# Patient Record
Sex: Female | Born: 1950
Health system: Southern US, Community
[De-identification: ages and names within clinical notes are randomized; demographics above are authoritative.]

## PROBLEM LIST (undated history)

## (undated) DIAGNOSIS — I10 Essential (primary) hypertension: Secondary | ICD-10-CM

## (undated) DIAGNOSIS — K219 Gastro-esophageal reflux disease without esophagitis: Secondary | ICD-10-CM

## (undated) DIAGNOSIS — F419 Anxiety disorder, unspecified: Secondary | ICD-10-CM

## (undated) DIAGNOSIS — K529 Noninfective gastroenteritis and colitis, unspecified: Secondary | ICD-10-CM

## (undated) DIAGNOSIS — R011 Cardiac murmur, unspecified: Secondary | ICD-10-CM

## (undated) DIAGNOSIS — N950 Postmenopausal bleeding: Secondary | ICD-10-CM

## (undated) DIAGNOSIS — L03313 Cellulitis of chest wall: Secondary | ICD-10-CM

## (undated) DIAGNOSIS — Z8719 Personal history of other diseases of the digestive system: Secondary | ICD-10-CM

## (undated) DIAGNOSIS — E039 Hypothyroidism, unspecified: Secondary | ICD-10-CM

## (undated) DIAGNOSIS — Z872 Personal history of diseases of the skin and subcutaneous tissue: Secondary | ICD-10-CM

## (undated) DIAGNOSIS — Z8679 Personal history of other diseases of the circulatory system: Secondary | ICD-10-CM

## (undated) DIAGNOSIS — K859 Acute pancreatitis without necrosis or infection, unspecified: Secondary | ICD-10-CM

## (undated) DIAGNOSIS — Z9889 Other specified postprocedural states: Secondary | ICD-10-CM

## (undated) DIAGNOSIS — F329 Major depressive disorder, single episode, unspecified: Secondary | ICD-10-CM

## (undated) DIAGNOSIS — M7550 Bursitis of unspecified shoulder: Secondary | ICD-10-CM

## (undated) DIAGNOSIS — C50912 Malignant neoplasm of unspecified site of left female breast: Secondary | ICD-10-CM

## (undated) DIAGNOSIS — Z973 Presence of spectacles and contact lenses: Secondary | ICD-10-CM

## (undated) DIAGNOSIS — N189 Chronic kidney disease, unspecified: Secondary | ICD-10-CM

## (undated) DIAGNOSIS — R112 Nausea with vomiting, unspecified: Secondary | ICD-10-CM

## (undated) DIAGNOSIS — C50412 Malignant neoplasm of upper-outer quadrant of left female breast: Secondary | ICD-10-CM

## (undated) DIAGNOSIS — Z170 Estrogen receptor positive status [ER+]: Secondary | ICD-10-CM

## (undated) HISTORY — PX: EYE SURGERY: SHX253

## (undated) HISTORY — PX: CATARACT EXTRACTION: SUR2

## (undated) HISTORY — PX: DENTAL SURGERY: SHX609

## (undated) HISTORY — PX: DILATION AND CURETTAGE OF UTERUS: SHX78

## (undated) HISTORY — PX: BREAST BIOPSY: SHX20

## (undated) HISTORY — PX: CATARACT EXTRACTION W/ INTRAOCULAR LENS IMPLANT: SHX1309

## (undated) HISTORY — PX: LIVER SURGERY: SHX698

## (undated) HISTORY — DX: Gastro-esophageal reflux disease without esophagitis: K21.9

## (undated) HISTORY — DX: Chronic kidney disease, unspecified: N18.9

## (undated) HISTORY — DX: Anxiety disorder, unspecified: F41.9

## (undated) HISTORY — PX: CATARACT EXTRACTION W/ INTRAOCULAR LENS  IMPLANT, BILATERAL: SHX1307

---

## 1999-08-08 ENCOUNTER — Other Ambulatory Visit: Admission: RE | Admit: 1999-08-08 | Discharge: 1999-08-08 | Payer: Self-pay | Admitting: Gynecology

## 2000-08-15 ENCOUNTER — Other Ambulatory Visit: Admission: RE | Admit: 2000-08-15 | Discharge: 2000-08-15 | Payer: Self-pay | Admitting: Gynecology

## 2001-09-16 ENCOUNTER — Other Ambulatory Visit: Admission: RE | Admit: 2001-09-16 | Discharge: 2001-09-16 | Payer: Self-pay | Admitting: Gynecology

## 2002-09-21 ENCOUNTER — Other Ambulatory Visit: Admission: RE | Admit: 2002-09-21 | Discharge: 2002-09-21 | Payer: Self-pay | Admitting: Gynecology

## 2003-09-28 ENCOUNTER — Other Ambulatory Visit: Admission: RE | Admit: 2003-09-28 | Discharge: 2003-09-28 | Payer: Self-pay | Admitting: Obstetrics and Gynecology

## 2004-11-13 ENCOUNTER — Other Ambulatory Visit: Admission: RE | Admit: 2004-11-13 | Discharge: 2004-11-13 | Payer: Self-pay | Admitting: Gynecology

## 2005-06-08 ENCOUNTER — Encounter: Admission: RE | Admit: 2005-06-08 | Discharge: 2005-06-08 | Payer: Self-pay | Admitting: Internal Medicine

## 2008-07-13 ENCOUNTER — Other Ambulatory Visit: Admission: RE | Admit: 2008-07-13 | Discharge: 2008-07-13 | Payer: Self-pay | Admitting: Internal Medicine

## 2009-07-12 ENCOUNTER — Ambulatory Visit (HOSPITAL_COMMUNITY): Admission: RE | Admit: 2009-07-12 | Discharge: 2009-07-12 | Payer: Self-pay | Admitting: Internal Medicine

## 2009-09-01 ENCOUNTER — Ambulatory Visit (HOSPITAL_COMMUNITY): Admission: RE | Admit: 2009-09-01 | Discharge: 2009-09-01 | Payer: Self-pay | Admitting: Internal Medicine

## 2010-10-27 ENCOUNTER — Encounter (INDEPENDENT_AMBULATORY_CARE_PROVIDER_SITE_OTHER): Payer: Self-pay | Admitting: *Deleted

## 2010-10-31 NOTE — Letter (Signed)
Summary: Pre Visit Letter Revised  [facility]  [address] [place], [place] [address]   Phone: [phone]  Fax: [phone]        10/27/2010 MRN: [id number] [name] [address] [place]  [address]             Procedure Date:  12/31/2010 @ 9:00    Direct colon-Dr. Jarold Motto  Welcome to the Gastroenterology Division at [facility].    You are scheduled to see a nurse for your pre-procedure visit on 11/20/2010 at 8:30 on the 3rd floor at [facility], [address]  We ask that you try to arrive at our office 15 minutes prior to your appointment time to allow for check-in.  Please take a minute to review the attached form.  If you answer "Yes" to one or more of the questions on the first page, we ask that you call the person listed at your earliest opportunity.  If you answer "No" to all of the questions, please complete the rest of the form and bring it to your appointment.    Your nurse visit will consist of discussing your medical and surgical history, your immediate family medical history, and your medications.   If you are unable to list all of your medications on the form, please bring the medication bottles to your appointment and we will list them.  We will need to be aware of both prescribed and over the counter drugs.  We will need to know exact dosage information as well.    Please be prepared to read and sign documents such as consent forms, a financial agreement, and acknowledgement forms.  If necessary, and with your consent, a friend or relative is welcome to sit-in on the nurse visit with you.  Please bring your insurance card so that we may make a copy of it.  If your insurance requires a referral to see a specialist, please bring your referral form from your primary care physician.  No co-pay is required for this nurse visit.     If you cannot keep your appointment, please call [phone] to cancel or reschedule prior to  your appointment date.  This allows [place] the opportunity to schedule an appointment for another patient in need of care.    Thank you for choosing [facility] for your medical needs.  We appreciate the opportunity to care for you.  Please visit [place] at our website  to learn more about our practice.  Sincerely, The Gastroenterology Division

## 2010-11-17 ENCOUNTER — Encounter: Payer: Self-pay | Admitting: *Deleted

## 2010-11-20 ENCOUNTER — Encounter: Payer: Self-pay | Admitting: Gastroenterology

## 2010-11-30 NOTE — Miscellaneous (Signed)
Summary: [facility]  Clinical Lists Changes  Medications: Added new medication of MOVIPREP 100 GM  SOLR (PEG-KCL-NACL-NASULF-NA ASC-C) As per prep instructions. - Signed Rx of MOVIPREP 100 GM  SOLR (PEG-KCL-NACL-NASULF-NA ASC-C) As per prep instructions.;  #1 x 0;  Signed;  Entered by: Ezra Sites RN;  Authorized by: Mardella Layman MD [facility];  Method used: Electronically to [facility] [address], [place], [place], [place]  [address], Ph: [phone], Fax: [phone] Observations: Added new observation of NKA: T (11/20/2010 8:11)    Prescriptions: MOVIPREP 100 GM  SOLR (PEG-KCL-NACL-NASULF-NA ASC-C) As per prep instructions.  #1 x 0   Entered by:   Ezra Sites RN   Authorized by:   Mardella Layman MD [facility]   Signed by:   Ezra Sites RN on 11/20/2010   Method used:   Electronically to        [facility] (retail)       [address]       [place], [place]  [address]       Ph: [phone]       Fax: [phone]   RxID:   [id number]

## 2010-11-30 NOTE — Letter (Signed)
Summary: [name] Instructions  [name]  [address] [place], [place] [address]   Phone: [phone]  Fax: [phone]       [name]    Jul 12, 1951    MRN: [id number]        Procedure Day /Date:  Monday 12/04/2010     Arrival Time: 8:00 am      Procedure Time: 9:00 am     Location of Procedure:                    _x _  [facility] (4th Floor)                        PREPARATION FOR COLONOSCOPY WITH MOVIPREP   Starting 5 days prior to your procedure Wednesday 3/28 do not eat nuts, seeds, popcorn, corn, beans, peas,  salads, or any raw vegetables.  Do not take any fiber supplements (e.g. Metamucil, Citrucel, and Benefiber).  THE DAY BEFORE YOUR PROCEDURE         DATE: Sunday 4/1 1.  Drink clear liquids the entire day-NO SOLID FOOD  2.  Do not drink anything colored red or purple.  Avoid juices with pulp.  No orange juice.  3.  Drink at least 64 oz. (8 glasses) of fluid/clear liquids during the day to prevent dehydration and help the prep work efficiently.  CLEAR LIQUIDS INCLUDE: Water Jello Ice Popsicles Tea (sugar ok, no milk/cream) Powdered fruit flavored drinks Coffee (sugar ok, no milk/cream) Gatorade Juice: apple, white grape, white cranberry  Lemonade Clear bullion, consomm, broth Carbonated beverages (any kind) Strained chicken noodle soup Hard Candy                             4.  In the morning, mix first dose of MoviPrep solution:    Empty 1 Pouch A and 1 Pouch B into the disposable container    Add lukewarm drinking water to the top line of the container. Mix to dissolve    Refrigerate (mixed solution should be used within 24 hrs)  5.  Begin drinking the prep at 5:00 p.m. The MoviPrep container is divided by 4 marks.   Every 15 minutes drink the solution down to the next mark (approximately 8 oz) until the full liter is complete.   6.  Follow completed prep with 16 oz of clear liquid of your choice (Nothing red  or purple).  Continue to drink clear liquids until bedtime.  7.  Before going to bed, mix second dose of MoviPrep solution:    Empty 1 Pouch A and 1 Pouch B into the disposable container    Add lukewarm drinking water to the top line of the container. Mix to dissolve    Refrigerate  THE DAY OF YOUR PROCEDURE      DATE: Monday 4/2  Beginning at 4:00 a.m. (5 hours before procedure):         1. Every 15 minutes, drink the solution down to the next mark (approx 8 oz) until the full liter is complete.  2. Follow completed prep with 16 oz. of clear liquid of your choice.    3. You may drink clear liquids until 7:00 am (2 HOURS BEFORE PROCEDURE).   MEDICATION INSTRUCTIONS  Unless otherwise instructed, you should take regular prescription medications with a small sip of water   as early as possible the morning of your  procedure.          OTHER INSTRUCTIONS  You will need a responsible adult at least 61 years of age to accompany you and drive you home.   This person must remain in the waiting room during your procedure.  Wear loose fitting clothing that is easily removed.  Leave jewelry and other valuables at home.  However, you may wish to bring a book to read or  an iPod/MP3 player to listen to music as you wait for your procedure to start.  Remove all body piercing jewelry and leave at home.  Total time from sign-in until discharge is approximately 2-3 hours.  You should go home directly after your procedure and rest.  You can resume normal activities the  day after your procedure.  The day of your procedure you should not:   Drive   Make legal decisions   Operate machinery   Drink alcohol   Return to work  You will receive specific instructions about eating, activities and medications before you leave.    The above instructions have been reviewed and explained to me by   Ezra Sites RN  November 20, 2010 8:49 AM     I fully understand and can verbalize  these instructions _____________________________ Date _________

## 2010-12-04 ENCOUNTER — Ambulatory Visit: Payer: BC Managed Care – PPO | Admitting: Gastroenterology

## 2010-12-04 ENCOUNTER — Encounter: Payer: Self-pay | Admitting: Gastroenterology

## 2010-12-04 VITALS — BP 152/76 | HR 61 | Temp 97.6°F | Resp 18 | Ht 64.5 in | Wt 180.0 lb

## 2010-12-04 DIAGNOSIS — R1032 Left lower quadrant pain: Secondary | ICD-10-CM

## 2010-12-04 DIAGNOSIS — Z1211 Encounter for screening for malignant neoplasm of colon: Secondary | ICD-10-CM

## 2010-12-04 DIAGNOSIS — K5904 Chronic idiopathic constipation: Secondary | ICD-10-CM

## 2010-12-04 DIAGNOSIS — K5900 Constipation, unspecified: Secondary | ICD-10-CM

## 2010-12-04 DIAGNOSIS — K589 Irritable bowel syndrome without diarrhea: Secondary | ICD-10-CM

## 2010-12-04 DIAGNOSIS — R109 Unspecified abdominal pain: Secondary | ICD-10-CM

## 2010-12-04 NOTE — Patient Instructions (Signed)
Discharge instructions given with verbal understanding. Instructions for a high fiber diet.

## 2010-12-05 ENCOUNTER — Telehealth: Payer: Self-pay

## 2010-12-05 ENCOUNTER — Telehealth: Payer: Self-pay | Admitting: *Deleted

## 2010-12-05 NOTE — Telephone Encounter (Signed)

## 2010-12-05 NOTE — Telephone Encounter (Signed)
LMOM for pt to call back. Per her COLON on 12/04/10, Dr Jarold Motto would like to schedule a f/u in 1 month.

## 2010-12-06 LAB — COMPREHENSIVE METABOLIC PANEL
ALT: 16 U/L (ref 0–35)
AST: 15 U/L (ref 0–37)
Albumin: 3.7 g/dL (ref 3.5–5.2)
Alkaline Phosphatase: 72 U/L (ref 39–117)
BUN: 8 mg/dL (ref 6–23)
CO2: 25 mEq/L (ref 19–32)
Calcium: 8.9 mg/dL (ref 8.4–10.5)
Chloride: 108 mEq/L (ref 96–112)
Creatinine, Ser: 0.64 mg/dL (ref 0.4–1.2)
GFR calc Af Amer: >60 mL/min (ref >60)
GFR calc non Af Amer: >60 mL/min (ref >60)
Glucose, Bld: 92 mg/dL (ref 70–99)
Potassium: 3.5 mEq/L (ref 3.5–5.1)
Sodium: 140 mEq/L (ref 135–145)
Total Bilirubin: 0.5 mg/dL (ref 0.3–1.2)
Total Protein: 6.3 g/dL (ref 6.0–8.3)

## 2010-12-06 LAB — CBC
HCT: 40 % (ref 36.0–46.0)
Hemoglobin: 13.9 g/dL (ref 12.0–15.0)
MCHC: 34.7 g/dL (ref 30.0–36.0)
MCV: 97.6 fL (ref 78.0–100.0)
Platelets: 270 10*3/uL (ref 150–400)
RBC: 4.1 MIL/uL (ref 3.87–5.11)
RDW: 13.4 % (ref 11.5–15.5)
WBC: 12 10*3/uL — ABNORMAL HIGH (ref 4.0–10.5)

## 2010-12-06 LAB — LIPID PANEL
Cholesterol: 155 mg/dL (ref 0–200)
HDL: 60 mg/dL (ref >39)
LDL Cholesterol: 40 mg/dL (ref 0–99)
Total CHOL/HDL Ratio: 2.6 RATIO
Triglycerides: 275 mg/dL — ABNORMAL HIGH (ref <150)
VLDL: 55 mg/dL — ABNORMAL HIGH (ref 0–40)

## 2010-12-13 ENCOUNTER — Encounter: Payer: Self-pay | Admitting: *Deleted

## 2010-12-13 ENCOUNTER — Telehealth: Payer: Self-pay | Admitting: *Deleted

## 2010-12-13 NOTE — Telephone Encounter (Signed)
Lmom for pt to call back on home number. Other number listed went to a recording.

## 2010-12-13 NOTE — Telephone Encounter (Signed)
Mailed pt a letter

## 2010-12-13 NOTE — Telephone Encounter (Signed)
Pt called back and was given an appt. on 01/23/11 at 1:30pm.

## 2011-01-01 ENCOUNTER — Other Ambulatory Visit: Payer: Self-pay | Admitting: Internal Medicine

## 2011-01-08 ENCOUNTER — Ambulatory Visit
Admission: RE | Admit: 2011-01-08 | Discharge: 2011-01-08 | Disposition: A | Discharge status: Home / Self Care | Payer: BC Managed Care – PPO | Source: Ambulatory Visit | Attending: Internal Medicine | Admitting: Internal Medicine

## 2011-01-08 MED ORDER — IOHEXOL 300 MG/ML  SOLN
125.0000 mL | Freq: Once | INTRAMUSCULAR | Status: AC | PRN
  Administered 2011-01-08: 125 mL via INTRAVENOUS

## 2011-01-23 ENCOUNTER — Ambulatory Visit: Payer: BC Managed Care – PPO | Admitting: Gastroenterology

## 2011-07-30 ENCOUNTER — Encounter: Payer: Self-pay | Admitting: Gastroenterology

## 2011-08-14 ENCOUNTER — Ambulatory Visit (INDEPENDENT_AMBULATORY_CARE_PROVIDER_SITE_OTHER): Payer: BC Managed Care – PPO | Admitting: Gastroenterology

## 2011-08-14 ENCOUNTER — Other Ambulatory Visit (INDEPENDENT_AMBULATORY_CARE_PROVIDER_SITE_OTHER): Payer: BC Managed Care – PPO

## 2011-08-14 ENCOUNTER — Encounter: Payer: Self-pay | Admitting: Gastroenterology

## 2011-08-14 VITALS — BP 136/80 | HR 68 | Ht 65.0 in | Wt 170.6 lb

## 2011-08-14 DIAGNOSIS — K8020 Calculus of gallbladder without cholecystitis without obstruction: Secondary | ICD-10-CM

## 2011-08-14 DIAGNOSIS — F419 Anxiety disorder, unspecified: Secondary | ICD-10-CM | POA: Insufficient documentation

## 2011-08-14 DIAGNOSIS — R143 Flatulence: Secondary | ICD-10-CM

## 2011-08-14 DIAGNOSIS — K219 Gastro-esophageal reflux disease without esophagitis: Secondary | ICD-10-CM | POA: Insufficient documentation

## 2011-08-14 DIAGNOSIS — F411 Generalized anxiety disorder: Secondary | ICD-10-CM

## 2011-08-14 DIAGNOSIS — R141 Gas pain: Secondary | ICD-10-CM | POA: Insufficient documentation

## 2011-08-14 LAB — BASIC METABOLIC PANEL
BUN: 13 mg/dL (ref 6–23)
CO2: 29 mEq/L (ref 19–32)
Calcium: 9.3 mg/dL (ref 8.4–10.5)
Chloride: 111 mEq/L (ref 96–112)
Creatinine, Ser: 0.8 mg/dL (ref 0.4–1.2)
GFR: 78.49 mL/min (ref >60.00)
Glucose, Bld: 103 mg/dL — ABNORMAL HIGH (ref 70–99)
Potassium: 4.6 mEq/L (ref 3.5–5.1)
Sodium: 146 mEq/L — ABNORMAL HIGH (ref 135–145)

## 2011-08-14 LAB — CBC WITH DIFFERENTIAL/PLATELET
Basophils Absolute: 0.1 10*3/uL (ref 0.0–0.1)
Basophils Relative: 0.8 % (ref 0.0–3.0)
Eosinophils Absolute: 0.4 10*3/uL (ref 0.0–0.7)
Eosinophils Relative: 4.5 % (ref 0.0–5.0)
HCT: 40.9 % (ref 36.0–46.0)
Hemoglobin: 14 g/dL (ref 12.0–15.0)
Lymphocytes Relative: 30.6 % (ref 12.0–46.0)
Lymphs Abs: 2.7 10*3/uL (ref 0.7–4.0)
MCHC: 34.1 g/dL (ref 30.0–36.0)
MCV: 96 fl (ref 78.0–100.0)
Monocytes Absolute: 0.6 10*3/uL (ref 0.1–1.0)
Monocytes Relative: 7 % (ref 3.0–12.0)
Neutro Abs: 5.1 10*3/uL (ref 1.4–7.7)
Neutrophils Relative %: 57.1 % (ref 43.0–77.0)
Platelets: 278 10*3/uL (ref 150.0–400.0)
RBC: 4.26 Mil/uL (ref 3.87–5.11)
RDW: 13.4 % (ref 11.5–14.6)
WBC: 8.9 10*3/uL (ref 4.5–10.5)

## 2011-08-14 LAB — HEPATIC FUNCTION PANEL
ALT: 18 U/L (ref 0–35)
AST: 22 U/L (ref 0–37)
Albumin: 3.9 g/dL (ref 3.5–5.2)
Alkaline Phosphatase: 92 U/L (ref 39–117)
Bilirubin, Direct: 0 mg/dL (ref 0.0–0.3)
Total Bilirubin: 0.4 mg/dL (ref 0.3–1.2)
Total Protein: 6.9 g/dL (ref 6.0–8.3)

## 2011-08-14 LAB — FERRITIN: Ferritin: 114.5 ng/mL (ref 10.0–291.0)

## 2011-08-14 LAB — IBC PANEL
Iron: 64 ug/dL (ref 42–145)
Saturation Ratios: 15.2 % — ABNORMAL LOW (ref 20.0–50.0)
Transferrin: 301.5 mg/dL (ref 212.0–360.0)

## 2011-08-14 LAB — VITAMIN B12: Vitamin B-12: 441 pg/mL (ref 211–911)

## 2011-08-14 LAB — TSH: TSH: 3.1 u[IU]/mL (ref 0.35–5.50)

## 2011-08-14 LAB — FOLATE: Folate: >24.8 ng/mL (ref >5.9)

## 2011-08-14 MED ORDER — ESOMEPRAZOLE MAGNESIUM 40 MG PO CPDR: 40.0000 mg | DELAYED_RELEASE_CAPSULE | Freq: Every day | ORAL | Status: DC

## 2011-08-14 NOTE — Progress Notes (Addendum)
History of Present Illness:  This is a very pleasant 60 year old Caucasian female who had screening colonoscopy in April of this year which was unremarkable. She now presents with chronic right upper quadrant pain radiating into the tip of her right shoulder blade felt nausea and vomiting. She does have her a severe abdominal gas and bloating, belching, burping, and passage of excessive flatus. She has rather typical reflux symptoms but has not been on therapy. No history of dysphagia, icterus, melena, hematochezia or bowel irregularity. The patient is a smoker but is only smoking cessation program, and denies use of alcohol or NSAIDs. Over the last several years she has gained 30 pounds in weight. In the history otherwise is noncontributory. CT scan of the abdomen this summer which was performed because of hematuria showed calcified gallstones with a small contracted gallbladder. There is no evidence of biliary ductal dilation. There is no history of previous hepatitis or pancreatitis or other chronic medical problems except for a chronic anxiety disorder.  I have reviewed this patient's present history, medical and surgical past history, allergies and medications.     ROS: The remainder of the 10 point ROS is negative     Physical Exam: General well developed well nourished patient in no acute distress, appearing their stated age. No stigmata of chronic liver disease noted Eyes PERRLA, no icterus, fundoscopic exam per opthamologist Skin no lesions noted Neck supple, no adenopathy, no thyroid enlargement, no tenderness Chest clear to percussion and auscultation Heart no significant murmurs, gallops or rubs noted Abdomen no hepatosplenomegaly masses or tenderness, BS normal. . Extremities no acute joint lesions, edema, phlebitis or evidence of cellulitis. Neurologic patient oriented x 3, cranial nerves intact, no focal neurologic deficits noted. Psychological mental status normal and normal  affect.  Assessment and plan: Chronic cholecystitis with calcified gallstones. She also has acid reflux, but many of her symptoms are probably related to her chronic gallbladder disease, ulcer her use of nonabsorbable carbohydrates with diet gum use,. I have placed her on a reflux regime with Dexilant 60 mg every morning. She is to avoid sorbitol and fructose in her diet. We have scheduled her for surgical consultation. He did see her patient education video on gallbladder management. Otherwise she is to continue her medical management as per primary care. I have suggested that she use Nicorette chewing gum for smoking cessation and avoidance of by mouth nonabsorbable carbohydrates. Encounter Diagnosis  Name Primary?  Merri Ray stones Yes

## 2011-08-14 NOTE — Patient Instructions (Signed)
Please go to the basement today for your labs.  Today you watched the movie on gall bladder surgery. Your prescription(s) have been sent to you pharmacy.  You were given a handout on artificial sweeteners to avoid.  Follow the [employer] below. We have referred you to [facility] Dr Derrell Lolling, the appt is on 09/06/2011 arrive at 8:15am to fill out paper work, Make sure you take your insurance card, copay and list of your medication. If you want to reschedule or cancel this appt call [phone], you can also call and check on a sooner appt if your interested. Their address is [address] [place] [address].  Diet for GERD or PUD Nutrition therapy can help ease the discomfort of gastroesophageal reflux disease (GERD) and peptic ulcer disease (PUD).  HOME CARE INSTRUCTIONS   Eat your meals slowly, in a relaxed setting.   Eat 5 to 6 small meals per day.   If a food causes distress, stop eating it for a period of time.  FOODS TO AVOID  Coffee, regular or decaffeinated.   Cola beverages, regular or low calorie.   Tea, regular or decaffeinated.   Pepper.   Cocoa.   High fat foods, including meats.   Butter, margarine, hydrogenated oil (trans fats).   Peppermint or spearmint (if you have GERD).   Fruits and vegetables if not tolerated.   Alcohol.   Nicotine (smoking or chewing). This is one of the most potent stimulants to acid production in the gastrointestinal tract.   Any food that seems to aggravate your condition.  If you have questions regarding your diet, ask your caregiver or a registered dietitian. TIPS  Lying flat may make symptoms worse. Keep the head of your bed raised 6 to 9 inches (15 to 23 cm) by using a foam wedge or blocks under the legs of the bed.   Do not lay down until 3 hours after eating a meal.   Daily physical activity may help reduce symptoms.  MAKE SURE YOU:   Understand these instructions.   Will watch your  condition.   Will get help right away if you are not doing well or get worse.  Document Released: 08/20/2005 Document Revised: 05/02/2011 Document Reviewed: 01/03/2009 [facility] Patient Information 2012 [place], [place].

## 2011-09-06 ENCOUNTER — Encounter (INDEPENDENT_AMBULATORY_CARE_PROVIDER_SITE_OTHER): Payer: Self-pay | Admitting: General Surgery

## 2011-09-06 ENCOUNTER — Telehealth (INDEPENDENT_AMBULATORY_CARE_PROVIDER_SITE_OTHER): Payer: Self-pay

## 2011-09-06 ENCOUNTER — Ambulatory Visit (INDEPENDENT_AMBULATORY_CARE_PROVIDER_SITE_OTHER): Payer: BC Managed Care – PPO | Admitting: General Surgery

## 2011-09-06 ENCOUNTER — Ambulatory Visit
Admission: RE | Admit: 2011-09-06 | Discharge: 2011-09-06 | Disposition: A | Discharge status: Home / Self Care | Payer: BC Managed Care – PPO | Source: Ambulatory Visit | Attending: General Surgery | Admitting: General Surgery

## 2011-09-06 VITALS — BP 150/84 | HR 80 | Temp 98.0°F | Resp 18 | Ht 65.0 in | Wt 169.8 lb

## 2011-09-06 DIAGNOSIS — R1011 Right upper quadrant pain: Secondary | ICD-10-CM

## 2011-09-06 NOTE — Telephone Encounter (Signed)
Pt notified of ultrasound result per Dr Jacinto Halim request. Pt to proceed with surgery as planned.

## 2011-09-06 NOTE — Progress Notes (Signed)
Patient ID: [name], female   DOB: June 21, 1951, 61 y.o.   MRN: [id number]  Chief Complaint  Patient presents with  . Cholelithiasis    eval gb    HPI [name] is a 61 y.o. female.  She is referred by Dr. Sheryn Sanchez and Dr. Nila Sanchez for consideration of cholecystectomy.  The patient had screening colonoscopy in April which was unremarkable. She had a CT scan in April of 2012 for hematuria workup. There was no abnormality of the urologic tract, but  the gallbladder appeared to be slightly thickwalled and slightly contracted and there was a question of filling defects. I reviewed this and it is not diagnostic of stones or calcium deposits. She was asymptomatic in terms of her gallbladder at that time.  The past 2 months she's been having recurrent episodes of right upper quadrant pain, epigastric pain radiating to her right scapular area. This occurs at night and is associated with nausea and vomiting. Her symptoms are reduced since she is on Nexium. She's being careful with what she eats and that has helped. Bowel movements are normal. She has never had any liver problems.  She does have some reflux symptoms. She is a smoker. She has gained 30 pounds over the past few years. No history of pancreatitis or hepatitis or medical problems other than chronic anxiety disorder.  She is referred because of persistent symptoms ultimately to her gallbladder. HPI  Past Medical History  Diagnosis Date  . Chronic kidney disease   . Anxiety   . GERD (gastroesophageal reflux disease)     Past Surgical History  Procedure Date  . Cataracts l eye   . Cataract extraction     Family History  Problem Relation Age of Onset  . Heart disease Mother   . Hypertension Mother   . Leukemia Father   . Heart disease Father   . Breast cancer Maternal Aunt   . Colon cancer      Father's aunt  . Cancer Brother     Social History History  Substance Use Topics  . Smoking status: Current  Some Day Smoker  . Smokeless tobacco: Never Used  . Alcohol Use: 0.6 oz/week    1 Glasses of wine per week    No Known Allergies  Current Outpatient Prescriptions  Medication Sig Dispense Refill  . Chromium Picolinate 1000 MCG TABS Take by mouth 1 day or 1 dose.        Marland Kitchen glucosamine-chondroitin 500-400 MG tablet Take 1 tablet by mouth 3 (three) times daily.        . Ascorbic Acid (VITAMIN C) 500 MG tablet Take 500 mg by mouth daily.        Marland Kitchen aspirin 81 MG EC tablet Take 81 mg by mouth daily.        . B Complex-Folic Acid (SUPER B COMPLEX MAXI PO) Take by mouth 1 dose over 46 hours.        . calcium carbonate (OS-CAL) 600 MG TABS Take 600 mg by mouth 2 (two) times daily.        . Calcium Carbonate-Vitamin D (CALCIUM-VITAMIN D) 500-200 MG-UNIT per tablet Take 1 tablet by mouth 2 (two) times daily with a meal.        . clonazePAM (KLONOPIN) 0.5 MG tablet Take 0.5 mg by mouth 2 (two) times daily as needed.        Marland Kitchen esomeprazole (NEXIUM) 40 MG capsule Take 1 capsule (40 mg total) by mouth daily.  30 capsule  1  . fish oil-omega-3 fatty acids 1000 MG capsule Take 2 g by mouth daily.        . Multiple Vitamin (MULTI-VITAMIN) tablet Take 1 tablet by mouth daily.        . NON FORMULARY Blueberry detox 8 oz daily         Review of Systems Review of Systems  Constitutional: Negative for fever, chills and unexpected weight change.  HENT: Negative for hearing loss, congestion, sore throat, trouble swallowing and voice change.   Eyes: Negative for visual disturbance.  Respiratory: Negative for cough and wheezing.   Cardiovascular: Negative for chest pain, palpitations and leg swelling.  Gastrointestinal: Positive for nausea, vomiting and abdominal pain. Negative for diarrhea, constipation, blood in stool, abdominal distention and anal bleeding.  Genitourinary: Negative for hematuria, vaginal bleeding and difficulty urinating.  Musculoskeletal: Positive for back pain. Negative for arthralgias.    Skin: Negative for rash and wound.  Neurological: Negative for seizures, syncope and headaches.  Hematological: Negative for adenopathy. Does not bruise/bleed easily.  Psychiatric/Behavioral: Negative for behavioral problems and confusion. The patient is nervous/anxious.     Blood pressure 150/84, pulse 80, temperature 98 F (36.7 C), resp. rate 18, height 5\' 5"  (1.651 m), weight 169 lb 12.8 oz (77.021 kg).  Physical Exam Physical Exam  Constitutional: She is oriented to person, place, and time. She appears well-developed and well-nourished. No distress.  HENT:  Head: Normocephalic and atraumatic.  Nose: Nose normal.  Mouth/Throat: No oropharyngeal exudate.  Eyes: Conjunctivae and EOM are normal. Pupils are equal, round, and reactive to light. Left eye exhibits no discharge. No scleral icterus.  Neck: Neck supple. No JVD present. No tracheal deviation present. No thyromegaly present.  Cardiovascular: Normal rate, regular rhythm, normal heart sounds and intact distal pulses.   No murmur heard. Pulmonary/Chest: Effort normal and breath sounds normal. No respiratory distress. She has no wheezes. She has no rales. She exhibits no tenderness.  Abdominal: Soft. Bowel sounds are normal. She exhibits no distension and no mass. There is no tenderness. There is no rebound and no guarding.       Subjectively tender in RUQ. No objective findings.  Musculoskeletal: She exhibits no edema and no tenderness.  Lymphadenopathy:    She has no cervical adenopathy.  Neurological: She is alert and oriented to person, place, and time. She exhibits normal muscle tone. Coordination normal.  Skin: Skin is warm. No rash noted. She is not diaphoretic. No erythema. No pallor.  Psychiatric: She has a normal mood and affect. Her behavior is normal. Judgment and thought content normal.    Data Reviewed  I have reviewed her CT scan from April, and I have reviewed her colonoscopy report, and I reviewed Dr. Maryfrances Sanchez office notes. Assessment    Right upper quadrant pain, nausea and vomiting, postprandial, food intolerances. High probability that this is due to biliary tract disease.  CT scan suggestive,  but not diagnostic of gallbladder disease.  Chronic anxiety disorder  Ongoing tobacco use  Past esophageal reflux disease`    Plan    Proceed with scheduling of laparoscopic cholecystectomy with cholangiogram.  I will take  the precaution of getting a preop ultrasound to be sure of the diagnosis.  I have discussed the indications and details of surgery with her. Risks and complications have been outlined, including but not limited to bleeding, infection, conversion to open laparotomy, bile leak, injury to adjacent organs such as the intestine or bile duct  with reconstructive surgery, wound healing problems, cardiac pulmonary and thromboembolic problems. She understands all these issues. All questions are answered. She agrees with the plan. She requested this be done at [facility] M 09/06/2011, 9:15 AM

## 2011-09-06 NOTE — Patient Instructions (Signed)
Your symptoms and CT scan strongly suggests that you're having recurrent gallbladder attacks. The CT scan is not completely diagnostic  of gallstones, and so we're going to take the precaution of getting an ultrasound this afternoon to be sure. In the meantime you will be scheduled for a laparoscopic cholecystectomy with cholangiogram at [facility]. I would advise a very low-fat diet until we get this done. I also advise to continue to take your Nexium.  Laparoscopic Cholecystectomy Laparoscopic cholecystectomy is surgery to remove the gallbladder. The gallbladder is located slightly to the right of center in the abdomen, behind the liver. It is a concentrating and storage sac for the bile produced in the liver. Bile aids in the digestion and absorption of fats. Gallbladder disease (cholecystitis) is an inflammation of your gallbladder. This condition is usually caused by a buildup of gallstones (cholelithiasis) in your gallbladder. Gallstones can block the flow of bile, resulting in inflammation and pain. In severe cases, emergency surgery may be required. When emergency surgery is not required, you will have time to prepare for the procedure. Laparoscopic surgery is an alternative to open surgery. Laparoscopic surgery usually has a shorter recovery time. Your common bile duct may also need to be examined and explored. Your caregiver will discuss this with you if he or she feels this should be done. If stones are found in the common bile duct, they may be removed. LET YOUR CAREGIVER KNOW ABOUT:  Allergies to food or medicine.   Medicines taken, including vitamins, herbs, eyedrops, over-the-counter medicines, and creams.   Use of steroids (by mouth or creams).   Previous problems with anesthetics or numbing medicines.   History of bleeding problems or blood clots.   Previous surgery.   Other health problems, including diabetes and kidney problems.   Possibility of pregnancy, if this applies.    RISKS AND COMPLICATIONS All surgery is associated with risks. Some problems that may occur following this procedure include:  Infection.   Damage to the common bile duct, nerves, arteries, veins, or other internal organs such as the stomach or intestines.   Bleeding.   A stone may remain in the common bile duct.  BEFORE THE PROCEDURE  Do not take aspirin for 3 days prior to surgery or blood thinners for 1 week prior to surgery.   Do not eat or drink anything after midnight the night before surgery.   Let your caregiver know if you develop a cold or other infectious problem prior to surgery.   You should be present 60 minutes before the procedure or as directed.  PROCEDURE  You will be given medicine that makes you sleep (general anesthetic). When you are asleep, your surgeon will make several small cuts (incisions) in your abdomen. One of these incisions is used to insert a small, lighted scope (laparoscope) into the abdomen. The laparoscope helps the surgeon see into your abdomen. Carbon dioxide gas will be pumped into your abdomen. The gas allows more room for the surgeon to perform your surgery. Other operating instruments are inserted through the other incisions. Laparoscopic procedures may not be appropriate when:  There is major scarring from previous surgery.   The gallbladder is extremely inflamed.   There are bleeding disorders or unexpected cirrhosis of the liver.   A pregnancy is near term.   Other conditions make the laparoscopic procedure impossible.  If your surgeon feels it is not safe to continue with a laparoscopic procedure, he or she will perform an open abdominal procedure.  In this case, the surgeon will make an incision to open the abdomen. This gives the surgeon a larger view and field to work within. This may allow the surgeon to perform procedures that sometimes cannot be performed with a laparoscope alone. Open surgery has a longer recovery time. AFTER THE  PROCEDURE  You will be taken to the recovery area where a nurse will watch and check your progress.   You may be allowed to go home the same day.   Do not resume physical activities until directed by your caregiver.   You may resume a normal diet and activities as directed.  Document Released: 08/20/2005 Document Revised: 05/02/2011 Document Reviewed: 02/02/2011 [facility] Patient Information 2012 [place], [place].

## 2011-09-07 ENCOUNTER — Encounter (HOSPITAL_COMMUNITY): Payer: Self-pay

## 2011-09-14 ENCOUNTER — Encounter (HOSPITAL_COMMUNITY): Payer: Self-pay | Admitting: *Deleted

## 2011-09-14 ENCOUNTER — Encounter (HOSPITAL_COMMUNITY): Payer: Self-pay

## 2011-09-14 ENCOUNTER — Encounter (HOSPITAL_COMMUNITY)
Admission: RE | Admit: 2011-09-14 | Discharge: 2011-09-14 | Disposition: A | Discharge status: Home / Self Care | Payer: BC Managed Care – PPO | Source: Ambulatory Visit | Attending: General Surgery | Admitting: General Surgery

## 2011-09-14 HISTORY — DX: Other specified postprocedural states: Z98.890

## 2011-09-14 HISTORY — DX: Nausea with vomiting, unspecified: R11.2

## 2011-09-14 LAB — CBC
Hemoglobin: 14.7 g/dL (ref 12.0–15.0)
MCV: 93.9 fL (ref 78.0–100.0)
Platelets: 300 10*3/uL (ref 150–400)
RBC: 4.59 MIL/uL (ref 3.87–5.11)
WBC: 9.9 10*3/uL (ref 4.0–10.5)

## 2011-09-14 LAB — URINALYSIS, ROUTINE W REFLEX MICROSCOPIC
Bilirubin Urine: NEGATIVE
Ketones, ur: NEGATIVE mg/dL
Nitrite: NEGATIVE
Protein, ur: NEGATIVE mg/dL
Urobilinogen, UA: 0.2 mg/dL (ref 0.0–1.0)

## 2011-09-14 LAB — COMPREHENSIVE METABOLIC PANEL
ALT: 15 U/L (ref 0–35)
AST: 17 U/L (ref 0–37)
CO2: 29 mEq/L (ref 19–32)
Chloride: 104 mEq/L (ref 96–112)
GFR calc non Af Amer: 77 mL/min — ABNORMAL LOW (ref >90)
Glucose, Bld: 103 mg/dL — ABNORMAL HIGH (ref 70–99)
Sodium: 143 mEq/L (ref 135–145)
Total Bilirubin: 0.7 mg/dL (ref 0.3–1.2)

## 2011-09-14 LAB — DIFFERENTIAL
Basophils Absolute: 0 10*3/uL (ref 0.0–0.1)
Basophils Relative: 0 % (ref 0–1)
Eosinophils Absolute: 0.5 10*3/uL (ref 0.0–0.7)
Eosinophils Relative: 5 % (ref 0–5)
Monocytes Absolute: 0.7 10*3/uL (ref 0.1–1.0)

## 2011-09-14 LAB — URINE MICROSCOPIC-ADD ON

## 2011-09-14 NOTE — Pre-Procedure Instructions (Signed)
20 [name]  09/14/2011   Your procedure is scheduled on: 07/17/12  Report to [facility] at 11:30 AM.  Call this number if you have problems the morning of surgery: [phone]   Remember: Discontinue Aspirin, Coumadin, Plavix, Effient and herbal medications.   Do not eat food:After Midnight.  May have clear liquids: up to 4 Hours before arrival.  Clear liquids include soda, tea, black coffee, apple or grape juice, broth.  Take these medicines the morning of surgery with A SIP OF WATER: klonopin, Nexium   Do not wear jewelry, make-up or nail polish.  Do not wear lotions, powders, or perfumes. You may wear deodorant.  Do not shave 48 hours prior to surgery.  Do not bring valuables to the hospital.  Contacts, dentures or bridgework may not be worn into surgery.  Leave suitcase in the car. After surgery it may be brought to your room.  For patients admitted to the hospital, checkout time is 11:00 AM the day of discharge.   Patients discharged the day of surgery will not be allowed to drive home.  Name and phone number of your driver: Marcy Panning (uncle) home ([phone]  Special Instructions: CHG Shower Use Special Wash: 1/2 bottle night before surgery and 1/2 bottle morning of surgery.   Please read over the following fact sheets that you were given: Pain Booklet, Coughing and Deep Breathing, MRSA Information and Surgical Site Infection Prevention

## 2011-09-16 MED ORDER — CEFAZOLIN SODIUM 1-5 GM-% IV SOLN
1.0000 g | INTRAVENOUS | Status: AC
  Administered 2011-09-17: 1 g via INTRAVENOUS
  Filled 2011-09-16: qty 50

## 2011-09-17 ENCOUNTER — Encounter (HOSPITAL_COMMUNITY): Payer: Self-pay | Admitting: *Deleted

## 2011-09-17 ENCOUNTER — Encounter (HOSPITAL_COMMUNITY): Payer: Self-pay | Admitting: Certified Registered"

## 2011-09-17 ENCOUNTER — Encounter (HOSPITAL_COMMUNITY)
Admission: RE | Disposition: A | Discharge status: Home / Self Care | Payer: Self-pay | Source: Ambulatory Visit | Attending: General Surgery

## 2011-09-17 ENCOUNTER — Ambulatory Visit (HOSPITAL_COMMUNITY)
Admission: RE | Admit: 2011-09-17 | Discharge: 2011-09-17 | Disposition: A | Discharge status: Home / Self Care | Payer: BC Managed Care – PPO | Source: Ambulatory Visit | Attending: General Surgery | Admitting: General Surgery

## 2011-09-17 ENCOUNTER — Ambulatory Visit (HOSPITAL_COMMUNITY): Payer: BC Managed Care – PPO | Admitting: Certified Registered"

## 2011-09-17 ENCOUNTER — Ambulatory Visit (HOSPITAL_COMMUNITY): Payer: BC Managed Care – PPO

## 2011-09-17 ENCOUNTER — Other Ambulatory Visit (INDEPENDENT_AMBULATORY_CARE_PROVIDER_SITE_OTHER): Payer: Self-pay | Admitting: General Surgery

## 2011-09-17 DIAGNOSIS — K8010 Calculus of gallbladder with chronic cholecystitis without obstruction: Secondary | ICD-10-CM | POA: Insufficient documentation

## 2011-09-17 DIAGNOSIS — Z01812 Encounter for preprocedural laboratory examination: Secondary | ICD-10-CM | POA: Insufficient documentation

## 2011-09-17 DIAGNOSIS — F411 Generalized anxiety disorder: Secondary | ICD-10-CM | POA: Insufficient documentation

## 2011-09-17 DIAGNOSIS — K219 Gastro-esophageal reflux disease without esophagitis: Secondary | ICD-10-CM | POA: Insufficient documentation

## 2011-09-17 HISTORY — PX: CHOLECYSTECTOMY: SHX55

## 2011-09-17 SURGERY — LAPAROSCOPIC CHOLECYSTECTOMY WITH INTRAOPERATIVE CHOLANGIOGRAM
Anesthesia: General | Wound class: Clean Contaminated

## 2011-09-17 MED ORDER — HYDROCODONE-ACETAMINOPHEN 5-325 MG PO TABS: 1.0000 | ORAL_TABLET | ORAL | Status: AC | PRN

## 2011-09-17 MED ORDER — MORPHINE SULFATE 2 MG/ML IJ SOLN: 2.0000 mg | INTRAMUSCULAR | Status: DC | PRN

## 2011-09-17 MED ORDER — ROCURONIUM BROMIDE 100 MG/10ML IV SOLN
INTRAVENOUS | Status: DC | PRN
  Administered 2011-09-17: 50 mg via INTRAVENOUS

## 2011-09-17 MED ORDER — SODIUM CHLORIDE 0.9 % IR SOLN
Status: DC | PRN
  Administered 2011-09-17: 1000 mL

## 2011-09-17 MED ORDER — BUPIVACAINE-EPINEPHRINE 0.25% -1:200000 IJ SOLN
INTRAMUSCULAR | Status: DC | PRN
  Administered 2011-09-17: 10 mL

## 2011-09-17 MED ORDER — OXYCODONE HCL 5 MG PO TABS: 5.0000 mg | ORAL_TABLET | ORAL | Status: DC | PRN

## 2011-09-17 MED ORDER — DROPERIDOL 2.5 MG/ML IJ SOLN
INTRAMUSCULAR | Status: DC | PRN
  Administered 2011-09-17: 0.625 mg via INTRAVENOUS

## 2011-09-17 MED ORDER — PROMETHAZINE HCL 25 MG/ML IJ SOLN: 12.5000 mg | Freq: Four times a day (QID) | INTRAMUSCULAR | Status: DC | PRN

## 2011-09-17 MED ORDER — LACTATED RINGERS IV SOLN
INTRAVENOUS | Status: DC
  Administered 2011-09-17: 12:00:00 via INTRAVENOUS

## 2011-09-17 MED ORDER — MEPERIDINE HCL 25 MG/ML IJ SOLN: 6.2500 mg | INTRAMUSCULAR | Status: DC | PRN

## 2011-09-17 MED ORDER — MIDAZOLAM HCL 5 MG/5ML IJ SOLN
INTRAMUSCULAR | Status: DC | PRN
  Administered 2011-09-17: 2 mg via INTRAVENOUS

## 2011-09-17 MED ORDER — ONDANSETRON HCL 4 MG/2ML IJ SOLN: 4.0000 mg | Freq: Four times a day (QID) | INTRAMUSCULAR | Status: DC | PRN

## 2011-09-17 MED ORDER — HYDROMORPHONE HCL PF 1 MG/ML IJ SOLN: 0.2500 mg | INTRAMUSCULAR | Status: DC | PRN

## 2011-09-17 MED ORDER — ONDANSETRON HCL 4 MG/2ML IJ SOLN: 4.0000 mg | Freq: Once | INTRAMUSCULAR | Status: DC | PRN

## 2011-09-17 MED ORDER — IOHEXOL 300 MG/ML  SOLN
INTRAMUSCULAR | Status: DC | PRN
  Administered 2011-09-17: 15 mL via INTRAVENOUS

## 2011-09-17 MED ORDER — GLYCOPYRROLATE 0.2 MG/ML IJ SOLN
INTRAMUSCULAR | Status: DC | PRN
  Administered 2011-09-17: .4 mg via INTRAVENOUS

## 2011-09-17 MED ORDER — ONDANSETRON HCL 4 MG/2ML IJ SOLN
INTRAMUSCULAR | Status: DC | PRN
  Administered 2011-09-17: 4 mg via INTRAVENOUS

## 2011-09-17 MED ORDER — ACETAMINOPHEN 325 MG PO TABS: 650.0000 mg | ORAL_TABLET | ORAL | Status: DC | PRN

## 2011-09-17 MED ORDER — ACETAMINOPHEN 650 MG RE SUPP: 650.0000 mg | RECTAL | Status: DC | PRN

## 2011-09-17 MED ORDER — SODIUM CHLORIDE 0.9 % IJ SOLN: 3.0000 mL | Freq: Two times a day (BID) | INTRAMUSCULAR | Status: DC

## 2011-09-17 MED ORDER — FENTANYL CITRATE 0.05 MG/ML IJ SOLN
INTRAMUSCULAR | Status: DC | PRN
  Administered 2011-09-17 (×2): 50 ug via INTRAVENOUS
  Administered 2011-09-17: 150 ug via INTRAVENOUS
  Administered 2011-09-17: 100 ug via INTRAVENOUS

## 2011-09-17 MED ORDER — SODIUM CHLORIDE 0.9 % IV SOLN: 250.0000 mL | INTRAVENOUS | Status: DC | PRN

## 2011-09-17 MED ORDER — LACTATED RINGERS IV SOLN
INTRAVENOUS | Status: DC | PRN
  Administered 2011-09-17 (×2): via INTRAVENOUS

## 2011-09-17 MED ORDER — DEXAMETHASONE SODIUM PHOSPHATE 10 MG/ML IJ SOLN
INTRAMUSCULAR | Status: DC | PRN
  Administered 2011-09-17: 8 mg via INTRAVENOUS

## 2011-09-17 MED ORDER — NEOSTIGMINE METHYLSULFATE 1 MG/ML IJ SOLN
INTRAMUSCULAR | Status: DC | PRN
  Administered 2011-09-17: 4 mg via INTRAVENOUS

## 2011-09-17 MED ORDER — SODIUM CHLORIDE 0.9 % IR SOLN
Status: DC | PRN
Start: 2011-09-17 — End: 2011-09-17
  Administered 2011-09-17: 1000 mL

## 2011-09-17 MED ORDER — SODIUM CHLORIDE 0.9 % IV SOLN: INTRAVENOUS | Status: DC

## 2011-09-17 MED ORDER — PROPOFOL 10 MG/ML IV EMUL
INTRAVENOUS | Status: DC | PRN
  Administered 2011-09-17: 120 mg via INTRAVENOUS

## 2011-09-17 MED ORDER — SODIUM CHLORIDE 0.9 % IJ SOLN: 3.0000 mL | INTRAMUSCULAR | Status: DC | PRN

## 2011-09-17 SURGICAL SUPPLY — 51 items
ADH SKN CLS APL DERMABOND .7 (GAUZE/BANDAGES/DRESSINGS) ×2
APL SKNCLS STERI-STRIP NONHPOA (GAUZE/BANDAGES/DRESSINGS)
APPLIER CLIP ROT 10 11.4 M/L (STAPLE) ×2
APR CLP MED LRG 11.4X10 (STAPLE) ×1
BAG SPEC RTRVL LRG 6X4 10 (ENDOMECHANICALS) ×1
BENZOIN TINCTURE PRP APPL 2/3 (GAUZE/BANDAGES/DRESSINGS) IMPLANT
BLADE SURG ROTATE 9660 (MISCELLANEOUS) IMPLANT
CANISTER SUCTION 2500CC (MISCELLANEOUS) ×2 IMPLANT
CHLORAPREP W/TINT 26ML (MISCELLANEOUS) ×2 IMPLANT
CLIP APPLIE ROT 10 11.4 M/L (STAPLE) ×1 IMPLANT
CLOTH BEACON ORANGE TIMEOUT ST (SAFETY) ×2 IMPLANT
COVER MAYO STAND STRL (DRAPES) ×2 IMPLANT
COVER SURGICAL LIGHT HANDLE (MISCELLANEOUS) ×2 IMPLANT
DECANTER SPIKE VIAL GLASS SM (MISCELLANEOUS) ×1 IMPLANT
DERMABOND ADVANCED (GAUZE/BANDAGES/DRESSINGS) ×2
DERMABOND ADVANCED .7 DNX12 (GAUZE/BANDAGES/DRESSINGS) IMPLANT
DRAPE C-ARM 42X72 X-RAY (DRAPES) ×2 IMPLANT
ELECT REM PT RETURN 9FT ADLT (ELECTROSURGICAL) ×2
ELECTRODE REM PT RTRN 9FT ADLT (ELECTROSURGICAL) ×1 IMPLANT
GLOVE BIO SURGEON STRL SZ7 (GLOVE) ×1 IMPLANT
GLOVE BIOGEL PI IND STRL 7.0 (GLOVE) IMPLANT
GLOVE BIOGEL PI INDICATOR 7.0 (GLOVE) ×3
GLOVE EUDERMIC 7 POWDERFREE (GLOVE) ×2 IMPLANT
GLOVE SURG SIGNA 7.5 PF LTX (GLOVE) ×2 IMPLANT
GLOVE SURG SS PI 6.5 STRL IVOR (GLOVE) ×2 IMPLANT
GOWN PREVENTION PLUS XLARGE (GOWN DISPOSABLE) ×2 IMPLANT
GOWN STRL NON-REIN LRG LVL3 (GOWN DISPOSABLE) ×4 IMPLANT
KIT BASIN OR (CUSTOM PROCEDURE TRAY) ×2 IMPLANT
KIT ROOM TURNOVER OR (KITS) ×2 IMPLANT
NS IRRIG 1000ML POUR BTL (IV SOLUTION) ×2 IMPLANT
PAD ARMBOARD 7.5X6 YLW CONV (MISCELLANEOUS) ×4 IMPLANT
POUCH SPECIMEN RETRIEVAL 10MM (ENDOMECHANICALS) ×2 IMPLANT
SCISSORS LAP 5X35 DISP (ENDOMECHANICALS) IMPLANT
SET CHOLANGIOGRAPH 5 50 .035 (SET/KITS/TRAYS/PACK) ×2 IMPLANT
SET IRRIG TUBING LAPAROSCOPIC (IRRIGATION / IRRIGATOR) ×2 IMPLANT
SLEEVE ENDOPATH XCEL 5M (ENDOMECHANICALS) ×1 IMPLANT
SLEEVE Z-THREAD 5X100MM (TROCAR) ×1 IMPLANT
SPECIMEN JAR MEDIUM (MISCELLANEOUS) ×1 IMPLANT
SPECIMEN JAR SMALL (MISCELLANEOUS) ×1 IMPLANT
STAPLER VISISTAT 35W (STAPLE) ×1 IMPLANT
SUT MNCRL AB 4-0 PS2 18 (SUTURE) ×2 IMPLANT
TOWEL OR 17X24 6PK STRL BLUE (TOWEL DISPOSABLE) ×2 IMPLANT
TOWEL OR 17X26 10 PK STRL BLUE (TOWEL DISPOSABLE) ×3 IMPLANT
TRAY LAPAROSCOPIC (CUSTOM PROCEDURE TRAY) ×2 IMPLANT
TROCAR HASSON GELL 12X100 (TROCAR) ×1 IMPLANT
TROCAR XCEL BLUNT TIP 100MML (ENDOMECHANICALS) ×1 IMPLANT
TROCAR XCEL NON-BLD 11X100MML (ENDOMECHANICALS) ×1 IMPLANT
TROCAR XCEL NON-BLD 5MMX100MML (ENDOMECHANICALS) ×1 IMPLANT
TROCAR Z-THREAD FIOS 11X100 BL (TROCAR) ×1 IMPLANT
TROCAR Z-THREAD FIOS 5X100MM (TROCAR) ×1 IMPLANT
WATER STERILE IRR 1000ML POUR (IV SOLUTION) IMPLANT

## 2011-09-17 NOTE — H&P (Signed)
[name] is an 61 y.o. female.   Chief Complaint:     right upper quadrant abdominal pain and gallstones.  HPI:    This is a 61 year old Caucasian female who has a three-month history of recurring episodes of right upper quadrant pain, epigastric pain radiating to her right scapula. This tends to occur at night and is associated with nausea and vomiting. Her symptoms are less since she has been on Nexium. Imaging studies confirmed gallstones. She has never been jaundiced. She has never had hepatitis or pancreatitis. She is brought to the hospital for elective cholecystectomy.  Past Medical History  Diagnosis Date  . Anxiety   . GERD (gastroesophageal reflux disease)   . PONV (postoperative nausea and vomiting)     with oral surgery she had  . Chronic kidney disease     states blood found in urine in past-tests done but could not find reason for blood    Past Surgical History  Procedure Date  . Cataracts l eye   . Cataract extraction   . Dental surgery     Family History  Problem Relation Age of Onset  . Heart disease Mother   . Hypertension Mother   . Leukemia Father   . Heart disease Father   . Breast cancer Maternal Aunt   . Colon cancer      Father's aunt  . Cancer Brother    Social History:  reports that she has been smoking Cigarettes.  She has a 10 pack-year smoking history. She has never used smokeless tobacco. She reports that she drinks about .6 ounces of alcohol per week. She reports that she does not use illicit drugs.  Allergies: No Known Allergies  Medications Prior to Admission  Medication Dose Route Frequency Provider Last Rate Last Dose  . ceFAZolin (ANCEF) IVPB 1 g/50 mL premix  1 g Intravenous 60 min Pre-Op Ernestene Mention, MD      . lactated ringers infusion   Intravenous Continuous Aubery Lapping, MD 50 mL/hr at 09/17/11 1212     Medications Prior to Admission  Medication Sig Dispense Refill  . Ascorbic Acid (VITAMIN C) 500 MG tablet Take 500  mg by mouth daily.        Marland Kitchen aspirin 81 MG EC tablet Take 81 mg by mouth daily.        . B Complex-Folic Acid (SUPER B COMPLEX MAXI PO) Take 1 tablet by mouth daily.       . clonazePAM (KLONOPIN) 0.5 MG tablet Take 0.5 mg by mouth daily as needed. For anxiety.      Marland Kitchen esomeprazole (NEXIUM) 40 MG capsule Take 1 capsule (40 mg total) by mouth daily.  30 capsule  1  . Multiple Vitamin (MULTI-VITAMIN) tablet Take 1 tablet by mouth daily.        . NON FORMULARY Blueberry detox 8 oz daily         No results found for this or any previous visit (from the past 48 hour(s)). No results found.  Review of Systems  Constitutional: Negative for fever, chills and weight loss.  HENT: Negative for nosebleeds, congestion, sore throat and neck pain.   Eyes: Negative for blurred vision and discharge.  Respiratory: Negative for cough, sputum production and wheezing.   Cardiovascular: Negative for chest pain, palpitations and leg swelling.  Gastrointestinal: Positive for heartburn, nausea and abdominal pain. Negative for blood in stool and melena.  Genitourinary: Negative for dysuria and urgency.  Musculoskeletal: Positive for back  pain. Negative for myalgias.  Skin: Negative for itching and rash.  Neurological: Negative for dizziness, speech change, focal weakness, loss of consciousness and headaches.  Endo/Heme/Allergies: Negative for polydipsia. Does not bruise/bleed easily.  Psychiatric/Behavioral: Negative for depression, suicidal ideas, hallucinations, memory loss and substance abuse. The patient is nervous/anxious. The patient does not have insomnia.     Blood pressure 146/84, pulse 65, temperature 98.1 F (36.7 C), temperature source Oral, resp. rate 18, SpO2 98.00%. Physical Exam  Constitutional: She is oriented to person, place, and time. She appears well-developed and well-nourished. No distress.  HENT:  Head: Normocephalic and atraumatic.  Nose: Nose normal.  Mouth/Throat: No oropharyngeal  exudate.  Eyes: Conjunctivae are normal. Pupils are equal, round, and reactive to light. No scleral icterus.  Neck: Normal range of motion. Neck supple. No thyromegaly present.  Cardiovascular: Normal rate, regular rhythm and normal heart sounds.   No murmur heard. Respiratory: Effort normal and breath sounds normal. No respiratory distress. She has no wheezes. She has no rales. She exhibits no tenderness.  GI: Soft. Bowel sounds are normal. She exhibits no distension and no mass. There is tenderness. There is no rebound and no guarding.       Mild subjective RUQ tenderness  Musculoskeletal: Normal range of motion. She exhibits no edema.  Lymphadenopathy:    She has no cervical adenopathy.  Neurological: She is alert and oriented to person, place, and time. She exhibits normal muscle tone. Coordination normal.  Skin: Skin is dry. No rash noted. She is not diaphoretic. No erythema. No pallor.  Psychiatric: She has a normal mood and affect. Her behavior is normal. Judgment and thought content normal.     Assessment/Plan  Chronic cholecystitis with cholelithiasis.  Chronic anxiety disorder  Gastroesophageal reflux disease  Tobacco abuse  Plan elective cholecystectomy with cholangiogram.  [name] 09/17/2011, 12:32 PM

## 2011-09-17 NOTE — Anesthesia Preprocedure Evaluation (Addendum)
Anesthesia Evaluation  Patient identified by MRN, date of birth, ID band Patient awake    Reviewed: Allergy & Precautions, H&P , NPO status , Patient's Chart, lab work & pertinent test results  History of Anesthesia Complications (+) PONV  Airway Mallampati: I TM Distance: >3 FB Neck ROM: Full    Dental  (+) Teeth Intact and Dental Advisory Given   Pulmonary          Cardiovascular     Neuro/Psych PSYCHIATRIC DISORDERS Anxiety    GI/Hepatic GERD-  ,  Endo/Other    Renal/GU      Musculoskeletal   Abdominal   Peds  Hematology   Anesthesia Other Findings   Reproductive/Obstetrics                           Anesthesia Physical Anesthesia Plan  ASA: II  Anesthesia Plan: General   Post-op Pain Management:    Induction: Intravenous  Airway Management Planned: Oral ETT  Additional Equipment:   Intra-op Plan:   Post-operative Plan: Extubation in OR  Informed Consent: I have reviewed the patients History and Physical, chart, labs and discussed the procedure including the risks, benefits and alternatives for the proposed anesthesia with the patient or authorized representative who has indicated his/her understanding and acceptance.     Plan Discussed with: CRNA and Surgeon  Anesthesia Plan Comments:        Anesthesia Quick Evaluation

## 2011-09-17 NOTE — Interval H&P Note (Signed)
History and Physical Interval Note:  09/17/2011 12:38 PM  [name]  has presented today for surgery, with the diagnosis of chronic cholecystitis   The goals of treatment and the various methods of treatment have been discussed with the patient and family. After consideration of risks, benefits and other options for treatment, the patient has consented to  Procedure(s): LAPAROSCOPIC CHOLECYSTECTOMY WITH INTRAOPERATIVE CHOLANGIOGRAM as a surgical intervention .  The patients' history has been reviewed today, patient examined today, no change in status, stable for surgery.  I have reviewed the patients' chart and labs.  Questions were answered to the patient's satisfaction.     Ernestene Mention 09/17/2011 12:39 PM

## 2011-09-17 NOTE — Transfer of Care (Signed)
Immediate Anesthesia Transfer of Care Note  Patient: [name]  Procedure(s) Performed:  LAPAROSCOPIC CHOLECYSTECTOMY WITH INTRAOPERATIVE CHOLANGIOGRAM - carm   Patient Location: PACU  Anesthesia Type: General  Level of Consciousness: awake, alert  and oriented  Airway & Oxygen Therapy: Patient Spontanous Breathing and Patient connected to nasal cannula oxygen  Post-op Assessment: Report given to PACU RN, Post -op Vital signs reviewed and stable and Patient moving all extremities X 4  Post vital signs: Reviewed and stable Filed Vitals:   09/17/11 1117  BP: 146/84  Pulse: 65  Temp: 36.7 C  Resp: 18    Complications: No apparent anesthesia complications

## 2011-09-17 NOTE — Op Note (Signed)
Patient Name:           [name]   Date of Surgery:        09/17/2011  Pre op Diagnosis:      Chronic Cholecystitis with cholelithiasis  Post op Diagnosis:    same  Procedure:                Laparoscopic cholecystectomy with intraoperative cholangiogram  Surgeon:                     Angelia Mould. Derrell Lolling, M.D., FACS  Assistant:                      Ovidio Kin, M.D., FACS  Operative Indications:   This is a 61 year old Caucasian female who gives a three-month history of recurring episodes of right upper quadrant pain, epigastric pain, nocturnal nausea and vomiting. Symptoms are less on Nexium, but persists. Imaging studies showed gallstones. She has had an extensive GI workup this year, including colonoscopy and CT scan which were otherwise unremarkable. She has been evaluated as an outpatient. She is brought to the operating room electively for cholecystectomy.  Operative Findings:       The gallbladder was thin-walled but was discolored and chronically inflamed. The intraoperative cholangiogram was normal, showing normal intrahepatic and extrahepatic biliary anatomy, no filling defect, and good flow of contrast into the duodenum. The liver, duodenum, stomach, small intestine, and large intestine were grossly normal to inspection.   Procedure in Detail: Following the induction of general endotracheal anesthesia and a timeout was held. The abdomen was prepped and draped in a sterile fashion. Intravenous antibiotics were given. 0.5% Marcaine with epinephrine was used as a local infiltration anesthetic.  A vertically oriented incision was made at the lower rim of the umbilicus. The fascia was incised in the midline. The abdominal cavity was entered under direct vision. An 11 mm Hassan trocar was inserted and secured with a pursestring suture of 0 Vicryl. Pneumoperitoneum was created and the video camera inserted. 11 mm trocar was placed in the subxiphoid region and Two 5 mm trocars placed in  the right upper quadrant. The gallbladder was grasped and elevated. We dissected the peritoneum off of the infundibulum of the gallbladder exposing the cystic duct and the cystic artery. We controlled the cystic artery with multiple medical clips as it went onto the wall of  the gallbladder and divided it. This created a large window and excellent critical view. A cholangiogram catheter was inserted into the cystic duct and a cholangiogram was obtained. The cholangiogram was normal as described above. The cholangiogram catheter was removed, the cystic duct secured with multiple ligaclips and divided. The gallbladder was dissected from its bed with electrocautery, placed in a specimen bag removed. The operative field was copiously irrigated. There was no bleeding and no bile leak whatsoever. The trocars were removed under direct vision. There was no bleeding at the trocar sites. The pneumoperitoneum was released. The fascia at the umbilicus was closed with 0 Vicryl sutures and the skin incisions closed with subcuticular sutures of 4-0 Monocryl and Dermabond. The patient was taken recovery in stable condition. There were no complications. EBL 10 cc. Counts correct.Procedure in Detail:               Angelia Mould. Derrell Lolling, M.D., FACS General and Minimally Invasive Surgery Breast and Colorectal Surgery  09/17/2011 2:08 PM

## 2011-09-18 ENCOUNTER — Encounter (HOSPITAL_COMMUNITY): Payer: Self-pay | Admitting: General Surgery

## 2011-09-18 ENCOUNTER — Telehealth (INDEPENDENT_AMBULATORY_CARE_PROVIDER_SITE_OTHER): Payer: Self-pay

## 2011-09-18 NOTE — Anesthesia Postprocedure Evaluation (Signed)
  Anesthesia Post-op Note  Patient: [name]  Procedure(s) Performed:  LAPAROSCOPIC CHOLECYSTECTOMY WITH INTRAOPERATIVE CHOLANGIOGRAM - carm   Patient Location: PACU  Anesthesia Type: General  Level of Consciousness: awake  Airway and Oxygen Therapy: Patient connected to nasal cannula oxygen  Post-op Pain: none  Post-op Assessment: Post-op Vital signs reviewed  Post-op Vital Signs: stable  Complications: No apparent anesthesia complications

## 2011-09-18 NOTE — Telephone Encounter (Signed)
Pt home doing well. Po appt made. 

## 2011-10-11 ENCOUNTER — Encounter (INDEPENDENT_AMBULATORY_CARE_PROVIDER_SITE_OTHER): Payer: Self-pay | Admitting: General Surgery

## 2011-10-11 ENCOUNTER — Ambulatory Visit (INDEPENDENT_AMBULATORY_CARE_PROVIDER_SITE_OTHER): Payer: BC Managed Care – PPO | Admitting: General Surgery

## 2011-10-11 VITALS — BP 136/80 | HR 70 | Temp 97.2°F | Resp 16 | Ht 65.5 in | Wt 168.6 lb

## 2011-10-11 DIAGNOSIS — Z9889 Other specified postprocedural states: Secondary | ICD-10-CM

## 2011-10-11 NOTE — Patient Instructions (Signed)
You have recovered from your gallbladder surgery without any problem. You may resume normal activities, no restriction. Low-fat diet. Return to see me if there are any new problems.

## 2011-10-11 NOTE — Progress Notes (Signed)
Subjective:     Patient ID: [name], female   DOB: June 12, 1951, 61 y.o.   MRN: [id number]  HPI This 61 year old female underwent laparoscopic cholecystectomy with cholangiogram on Jan. 14, 2013. . Pathology report shows chronic cholecystitis with cholelithiasis. She has done well. She has no indigestion, reflux, or diarrhea. She has no pain or wound problems. Review of Systems     Objective:   Physical Exam The patient looks well. Abdomen soft and nontender. Trocar sites well healed no infection. No hernia.    Assessment:     Chronic cholecystitis with cholelithiasis. Uneventful recovery following laparoscopic cholecystectomy with cholangiogram    Plan:     Low-fat diet. Resume exercise and normal physical activities. Return to see me p.r.n.   Angelia Mould. Derrell Lolling, M.D., [facility] General and Minimally invasive Surgery Breast and Colorectal Surgery Office:   [phone] Pager:   [phone]

## 2011-10-18 ENCOUNTER — Other Ambulatory Visit (INDEPENDENT_AMBULATORY_CARE_PROVIDER_SITE_OTHER): Payer: Self-pay | Admitting: General Surgery

## 2011-10-18 DIAGNOSIS — R109 Unspecified abdominal pain: Secondary | ICD-10-CM

## 2011-10-19 ENCOUNTER — Other Ambulatory Visit: Payer: BC Managed Care – PPO

## 2011-10-19 ENCOUNTER — Telehealth (INDEPENDENT_AMBULATORY_CARE_PROVIDER_SITE_OTHER): Payer: Self-pay

## 2011-10-19 ENCOUNTER — Ambulatory Visit
Admission: RE | Admit: 2011-10-19 | Discharge: 2011-10-19 | Disposition: A | Discharge status: Home / Self Care | Payer: BC Managed Care – PPO | Source: Ambulatory Visit | Attending: General Surgery | Admitting: General Surgery

## 2011-10-19 DIAGNOSIS — R109 Unspecified abdominal pain: Secondary | ICD-10-CM

## 2011-10-19 MED ORDER — IOHEXOL 300 MG/ML  SOLN
100.0000 mL | Freq: Once | INTRAMUSCULAR | Status: AC | PRN
  Administered 2011-10-19: 100 mL via INTRAVENOUS

## 2011-10-19 NOTE — Telephone Encounter (Signed)
Per Dr Derrell Lolling I called pt to check on symptoms. Pt states her abd pain is better but having pain at temple area now. I advised pt her cts were reviewed with Dr Derrell Lolling and found to be normal. Pt states she did not go for labs. I advised her to go now for lab work and per Dr Derrell Lolling she should see her PCP today to work up other symptoms. Pt states she has appt with Dr Tressa Busman and ask me to fax notes and ct to him. I have faxed records and advised pt that labs even as stats will be later today before they will be ready.

## 2011-10-19 NOTE — Telephone Encounter (Signed)
Per [facility] lab all values on cmet,cbc and lipase within normal range. Pt notified. Pt will continue to f/u with Dr Chilton Si.

## 2011-10-31 ENCOUNTER — Encounter (INDEPENDENT_AMBULATORY_CARE_PROVIDER_SITE_OTHER): Payer: Self-pay

## 2011-12-24 ENCOUNTER — Other Ambulatory Visit: Payer: Self-pay

## 2012-12-25 ENCOUNTER — Other Ambulatory Visit: Payer: Self-pay | Admitting: Gynecology

## 2014-09-03 HISTORY — PX: MASTECTOMY: SHX3

## 2014-11-18 ENCOUNTER — Other Ambulatory Visit: Payer: Self-pay | Admitting: Internal Medicine

## 2014-11-18 DIAGNOSIS — R5381 Other malaise: Secondary | ICD-10-CM

## 2014-11-18 DIAGNOSIS — Z780 Asymptomatic menopausal state: Secondary | ICD-10-CM

## 2014-11-19 ENCOUNTER — Other Ambulatory Visit: Payer: Self-pay

## 2014-11-19 DIAGNOSIS — Z1231 Encounter for screening mammogram for malignant neoplasm of breast: Secondary | ICD-10-CM

## 2014-12-01 ENCOUNTER — Ambulatory Visit
Admission: RE | Admit: 2014-12-01 | Discharge: 2014-12-01 | Disposition: A | Discharge status: Home / Self Care | Payer: 59 | Source: Ambulatory Visit

## 2014-12-01 ENCOUNTER — Ambulatory Visit
Admission: RE | Admit: 2014-12-01 | Discharge: 2014-12-01 | Disposition: A | Discharge status: Home / Self Care | Payer: 59 | Source: Ambulatory Visit | Attending: Internal Medicine | Admitting: Internal Medicine

## 2014-12-01 DIAGNOSIS — Z1231 Encounter for screening mammogram for malignant neoplasm of breast: Secondary | ICD-10-CM

## 2014-12-01 DIAGNOSIS — Z780 Asymptomatic menopausal state: Secondary | ICD-10-CM

## 2014-12-03 ENCOUNTER — Other Ambulatory Visit: Payer: Self-pay | Admitting: Obstetrics and Gynecology

## 2014-12-03 DIAGNOSIS — R928 Other abnormal and inconclusive findings on diagnostic imaging of breast: Secondary | ICD-10-CM

## 2014-12-08 ENCOUNTER — Other Ambulatory Visit: Payer: Self-pay | Admitting: Obstetrics and Gynecology

## 2014-12-08 ENCOUNTER — Ambulatory Visit
Admission: RE | Admit: 2014-12-08 | Discharge: 2014-12-08 | Disposition: A | Discharge status: Home / Self Care | Payer: 59 | Source: Ambulatory Visit | Attending: Obstetrics and Gynecology | Admitting: Obstetrics and Gynecology

## 2014-12-08 DIAGNOSIS — R921 Mammographic calcification found on diagnostic imaging of breast: Secondary | ICD-10-CM

## 2014-12-08 DIAGNOSIS — R928 Other abnormal and inconclusive findings on diagnostic imaging of breast: Secondary | ICD-10-CM

## 2014-12-09 ENCOUNTER — Other Ambulatory Visit: Payer: Self-pay | Admitting: Obstetrics and Gynecology

## 2014-12-09 DIAGNOSIS — C50912 Malignant neoplasm of unspecified site of left female breast: Secondary | ICD-10-CM

## 2014-12-13 ENCOUNTER — Other Ambulatory Visit: Payer: Self-pay | Admitting: Obstetrics and Gynecology

## 2014-12-13 ENCOUNTER — Ambulatory Visit
Admission: RE | Admit: 2014-12-13 | Discharge: 2014-12-13 | Disposition: A | Discharge status: Home / Self Care | Payer: 59 | Source: Ambulatory Visit | Attending: Obstetrics and Gynecology | Admitting: Obstetrics and Gynecology

## 2014-12-13 ENCOUNTER — Other Ambulatory Visit: Payer: Self-pay

## 2014-12-13 DIAGNOSIS — C50912 Malignant neoplasm of unspecified site of left female breast: Secondary | ICD-10-CM

## 2014-12-13 MED ORDER — GADOBENATE DIMEGLUMINE 529 MG/ML IV SOLN
15.0000 mL | Freq: Once | INTRAVENOUS | Status: AC | PRN
  Administered 2014-12-13: 15 mL via INTRAVENOUS

## 2014-12-23 ENCOUNTER — Other Ambulatory Visit: Payer: Self-pay | Admitting: General Surgery

## 2014-12-23 DIAGNOSIS — C50412 Malignant neoplasm of upper-outer quadrant of left female breast: Secondary | ICD-10-CM

## 2014-12-24 ENCOUNTER — Other Ambulatory Visit: Payer: Self-pay | Admitting: General Surgery

## 2014-12-24 DIAGNOSIS — C50412 Malignant neoplasm of upper-outer quadrant of left female breast: Secondary | ICD-10-CM

## 2014-12-30 NOTE — Pre-Procedure Instructions (Signed)
LAILA MYHRE  12/30/2014   Your procedure is scheduled on:  Tuesday, May 3rd   Report to [facility] Admitting at 7:30 AM.   Call this number if you have problems the morning of surgery: [phone]   Remember:   Do not eat food or drink liquids after midnight Monday.    Take these medicines the morning of surgery with A SIP OF WATER: NOTHING   Do not wear jewelry, make-up or nail polish.  Do not wear lotions, powders, or perfumes. You may NOT wear deodorant the day of surgery.  Do not shave underarms & legs 48 hours prior to surgery.    Do not bring valuables to the hospital.  [facility] is not responsible for any belongings or valuables.               Contacts, dentures or bridgework may not be worn into surgery.  Leave suitcase in the car. After surgery it may be brought to your room.  For patients admitted to the hospital, discharge time is determined by your treatment team.                 Name and phone number of your driver:    Special Instructions: "Preparing for Surgery" instruction sheet.   Please read over the following fact sheets that you were given: Pain Booklet, Coughing and Deep Breathing and Surgical Site Infection Prevention

## 2014-12-31 ENCOUNTER — Encounter (HOSPITAL_COMMUNITY)
Admission: RE | Admit: 2014-12-31 | Discharge: 2014-12-31 | Disposition: A | Discharge status: Home / Self Care | Payer: Medicare Other | Source: Ambulatory Visit | Attending: General Surgery | Admitting: General Surgery

## 2014-12-31 ENCOUNTER — Encounter (HOSPITAL_COMMUNITY): Payer: Self-pay

## 2014-12-31 ENCOUNTER — Ambulatory Visit
Admission: RE | Admit: 2014-12-31 | Discharge: 2014-12-31 | Disposition: A | Discharge status: Home / Self Care | Payer: 59 | Source: Ambulatory Visit | Attending: General Surgery | Admitting: General Surgery

## 2014-12-31 DIAGNOSIS — C50412 Malignant neoplasm of upper-outer quadrant of left female breast: Secondary | ICD-10-CM

## 2014-12-31 DIAGNOSIS — K219 Gastro-esophageal reflux disease without esophagitis: Secondary | ICD-10-CM | POA: Diagnosis not present

## 2014-12-31 DIAGNOSIS — Z87891 Personal history of nicotine dependence: Secondary | ICD-10-CM | POA: Diagnosis not present

## 2014-12-31 DIAGNOSIS — F419 Anxiety disorder, unspecified: Secondary | ICD-10-CM | POA: Diagnosis not present

## 2014-12-31 DIAGNOSIS — Z01818 Encounter for other preprocedural examination: Secondary | ICD-10-CM

## 2014-12-31 DIAGNOSIS — Z170 Estrogen receptor positive status [ER+]: Secondary | ICD-10-CM | POA: Diagnosis not present

## 2014-12-31 DIAGNOSIS — D0512 Intraductal carcinoma in situ of left breast: Secondary | ICD-10-CM | POA: Diagnosis not present

## 2014-12-31 DIAGNOSIS — Z803 Family history of malignant neoplasm of breast: Secondary | ICD-10-CM | POA: Diagnosis not present

## 2014-12-31 DIAGNOSIS — Z79899 Other long term (current) drug therapy: Secondary | ICD-10-CM | POA: Diagnosis not present

## 2014-12-31 DIAGNOSIS — D0510 Intraductal carcinoma in situ of unspecified breast: Secondary | ICD-10-CM | POA: Diagnosis present

## 2014-12-31 LAB — COMPREHENSIVE METABOLIC PANEL
ALT: 50 U/L — AB (ref 0–35)
ANION GAP: 10 (ref 5–15)
AST: 45 U/L — AB (ref 0–37)
Albumin: 3.8 g/dL (ref 3.5–5.2)
Alkaline Phosphatase: 89 U/L (ref 39–117)
BILIRUBIN TOTAL: 0.8 mg/dL (ref 0.3–1.2)
BUN: 15 mg/dL (ref 6–23)
CO2: 27 mmol/L (ref 19–32)
Calcium: 9.6 mg/dL (ref 8.4–10.5)
Chloride: 106 mmol/L (ref 96–112)
Creatinine, Ser: 1 mg/dL (ref 0.50–1.10)
GFR calc Af Amer: 67 mL/min — ABNORMAL LOW (ref >90)
GFR, EST NON AFRICAN AMERICAN: 58 mL/min — AB (ref >90)
Glucose, Bld: 108 mg/dL — ABNORMAL HIGH (ref 70–99)
Potassium: 3.9 mmol/L (ref 3.5–5.1)
Sodium: 143 mmol/L (ref 135–145)
TOTAL PROTEIN: 6.2 g/dL (ref 6.0–8.3)

## 2014-12-31 LAB — CBC WITH DIFFERENTIAL/PLATELET
BASOS PCT: 1 % (ref 0–1)
Basophils Absolute: 0.1 10*3/uL (ref 0.0–0.1)
EOS PCT: 7 % — AB (ref 0–5)
Eosinophils Absolute: 0.7 10*3/uL (ref 0.0–0.7)
HCT: 39.5 % (ref 36.0–46.0)
HEMOGLOBIN: 13.5 g/dL (ref 12.0–15.0)
Lymphocytes Relative: 34 % (ref 12–46)
Lymphs Abs: 3.3 10*3/uL (ref 0.7–4.0)
MCH: 31.6 pg (ref 26.0–34.0)
MCHC: 34.2 g/dL (ref 30.0–36.0)
MCV: 92.5 fL (ref 78.0–100.0)
MONO ABS: 0.6 10*3/uL (ref 0.1–1.0)
Monocytes Relative: 6 % (ref 3–12)
NEUTROS ABS: 5 10*3/uL (ref 1.7–7.7)
NEUTROS PCT: 52 % (ref 43–77)
PLATELETS: 262 10*3/uL (ref 150–400)
RBC: 4.27 MIL/uL (ref 3.87–5.11)
RDW: 13.1 % (ref 11.5–15.5)
WBC: 9.7 10*3/uL (ref 4.0–10.5)

## 2014-12-31 NOTE — Progress Notes (Signed)
Has had h/o palpitations, "yrs ago".   No problems since. She also had had blood in urine.  Has seen urologist (across from [facility]) "not enough to be concerned about" so they 'cut her lose'.   DA

## 2015-01-03 MED ORDER — CEFAZOLIN SODIUM-DEXTROSE 2-3 GM-% IV SOLR
2.0000 g | INTRAVENOUS | Status: AC
Start: 2015-01-04 — End: 2015-01-04
  Administered 2015-01-04: 2 g via INTRAVENOUS
  Filled 2015-01-03: qty 50

## 2015-01-03 MED ORDER — CHLORHEXIDINE GLUCONATE 4 % EX LIQD
1.0000 "application " | Freq: Once | CUTANEOUS | Status: DC
  Filled 2015-01-03: qty 15

## 2015-01-03 NOTE — H&P (Signed)
[name]  Location: [facility] Patient #: [id number] DOB: 1950-11-13 Divorced / Language: English / Race: White Female       History of Present Illness  .  The patient is a 64 year old female who presents with breast cancer. She is referred by Dr. 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 Dr. Zada Girt is her PCP. She has no prior history of breast cancer or breast disease. Last mammogram 2 years ago. Recent mammogram show a 21 mm area of microcalcifications in the left breast, upper outer quadrant. Her breasts are very dense. Image guided biopsy shows high-grade ductal carcinoma in situ, estrogen receptor 100%, progesterone receptor 0. Subsequent MRI shows this is a solitary finding, with biopsy changes only. Nodes normal. She is here with a family member in the office today Past history reveals performed cholecystectomy in 2013. Cataract surgery. Tobacco abuse for many years, she has recently quit 4 weeks ago. Chronic anxiety disorder, previously on medications but on no current medications. GERD well controlled on Nexium. Rare alcohol. She is divorced. Has 1 child. She is retired from [employer] Family history reveals breast cancer in a maternal aunt and a maternal cousin. No ovarian cancer. We had a long talk about the management of breast cancer in general, her stage of disease, and her options. She is strongly mated motivated for breast conservation surgery. She will be scheduled for left partial mastectomy with radioactive seed localization. I discussed the indications, details, techniques, and numerous risk of the surgery with her. She is aware the risks of bleeding, infection, reoperation for positive margins, cosmetic deformity, nerve damage with chronic pain, and other unforeseen problems. She knows she will be referred to  medical oncology and radiation oncology postop. At this time all of her questions are answered. She seems to understand all these issues. She agrees with this plan. Second opinion was offered and declined.   Other Problems  Back Pain Bladder Problems Breast Cancer Gastroesophageal Reflux Disease Migraine Headache  Past Surgical History  Breast Biopsy Left. Cataract Surgery Left. Gallbladder Surgery - Laparoscopic  Diagnostic Studies History  Colonoscopy 1-5 years ago Mammogram within last year Pap Smear 1-5 years ago  Allergies  No Known Drug Allergies04/21/2016  Medication History  Chantix (0.5MG  Tablet, Oral) Active. Multivitamins (Oral daily) Active. Omeprazole (40MG  Capsule DR, Oral daily) Active.  Social History Alcohol use Occasional alcohol use. Caffeine use Carbonated beverages, Coffee, Tea. Illicit drug use Remotely quit drug use. Tobacco use Former smoker.  Family History  Alcohol Abuse Brother, Family Members In General. Arthritis Mother. Breast Cancer Family Members In General. Cancer Father. Colon Cancer Family Members In General. Heart Disease Mother. Hypertension Brother, Mother. Migraine Headache Mother. Thyroid problems Family Members In General, Mother.  Pregnancy / Birth History  Age at menarche 31 years. Age of menopause <44 Gravida 1 Irregular periods Maternal age 45-20 Para 1  Review of Systems  General Present- Fatigue, Night Sweats and Weight Gain. Not Present- Appetite Loss, Chills, Fever and Weight Loss. Skin Present- Dryness. Not Present- Change in Wart/Mole, Hives, Jaundice, New Lesions, Non-Healing Wounds, Rash and Ulcer. HEENT Present- Seasonal Allergies and Wears glasses/contact lenses. Not Present- Earache, Hearing Loss, Hoarseness, Nose Bleed, Oral Ulcers, Ringing in the Ears, Sinus Pain, Sore Throat, Visual Disturbances and Yellow Eyes. Respiratory Present- Snoring. Not Present- Bloody  sputum, Chronic Cough, Difficulty Breathing and Wheezing. Breast  Present- Breast Pain. Not Present- Breast Mass, Nipple Discharge and Skin Changes. Cardiovascular Present- Swelling of Extremities. Not Present- Chest Pain, Difficulty Breathing Lying Down, Leg Cramps, Palpitations, Rapid Heart Rate and Shortness of Breath. Gastrointestinal Present- Abdominal Pain, Bloating, Constipation, Indigestion and Nausea. Not Present- Bloody Stool, Change in Bowel Habits, Chronic diarrhea, Difficulty Swallowing, Excessive gas, Gets full quickly at meals, Hemorrhoids, Rectal Pain and Vomiting. Musculoskeletal Present- Back Pain. Not Present- Joint Pain, Joint Stiffness, Muscle Pain, Muscle Weakness and Swelling of Extremities. Neurological Present- Numbness. Not Present- Decreased Memory, Fainting, Headaches, Seizures, Tingling, Tremor, Trouble walking and Weakness. Endocrine Present- Hair Changes and Hot flashes. Not Present- Cold Intolerance, Excessive Hunger, Heat Intolerance and New Diabetes. Hematology Present- Easy Bruising. Not Present- Excessive bleeding, Gland problems, HIV and Persistent Infections.   Vitals   Weight: 178.25 lb Height: 63in Body Surface Area: 1.9 m Body Mass Index: 31.58 kg/m Temp.: 98.49F(Oral)  Pulse: 82 (Regular)  BP: 134/78 (Sitting, Left Arm, Standard)    Physical Exam General Mental Status-Alert. General Appearance-Consistent with stated age. Hydration-Well hydrated. Voice-Normal.  Head and Neck Head-normocephalic, atraumatic with no lesions or palpable masses. Trachea-midline. Thyroid Gland Characteristics - normal size and consistency.  Eye Eyeball - Bilateral-Extraocular movements intact. Sclera/Conjunctiva - Bilateral-No scleral icterus.  Chest and Lung Exam Chest and lung exam reveals -quiet, even and easy respiratory effort with no use of accessory muscles and on auscultation, normal breath sounds, no adventitious sounds  and normal vocal resonance. Inspection Chest Wall - Normal. Back - normal.  Breast Breast - Left-Symmetric, Non Tender, No Biopsy scars, no Dimpling, No Inflammation, No Lumpectomy scars, No Mastectomy scars, No Peau d' Orange. Breast - Right-Symmetric, Non Tender, No Biopsy scars, no Dimpling, No Inflammation, No Lumpectomy scars, No Mastectomy scars, No Peau d' Orange. Breast Lump-No Palpable Breast Mass. Note: No palpable masses in either breast. No hematoma or ecchymoses. No axillary adenopathy. Skin healthy.   Cardiovascular Cardiovascular examination reveals -normal heart sounds, regular rate and rhythm with no murmurs and normal pedal pulses bilaterally.  Abdomen Inspection Inspection of the abdomen reveals - No Hernias. Palpation/Percussion Palpation and Percussion of the abdomen reveal - Soft, Non Tender, No Rebound tenderness, No Rigidity (guarding) and No hepatosplenomegaly. Auscultation Auscultation of the abdomen reveals - Bowel sounds normal. Note: Laparoscopic scars from cholecystectomy well-healed.   Neurologic Neurologic evaluation reveals -alert and oriented x 3 with no impairment of recent or remote memory. Mental Status-Normal.  Musculoskeletal Normal Exam - Left-Upper Extremity Strength Normal and Lower Extremity Strength Normal. Normal Exam - Right-Upper Extremity Strength Normal and Lower Extremity Strength Normal.  Lymphatic Head & Neck  General Head & Neck Lymphatics: Bilateral - Description - Normal. Axillary  General Axillary Region: Bilateral - Description - Normal. Tenderness - Non Tender. Femoral & Inguinal  Generalized Femoral & Inguinal Lymphatics: Bilateral - Description - Normal. Tenderness - Non Tender.    Assessment & Plan  PRIMARY CANCER OF UPPER OUTER QUADRANT OF LEFT FEMALE BREAST (174.4  C50.412) Impression: High-grade DCIS, receptor positive Current Plans     Schedule for Surgery  You have a 2 cm area of  calcifications in the upper outer quadrant of the left breast. This appears to be a solitary finding on MRI. Biopsy shows ductal carcinoma in situ, this is an early form of breast cancer. The cancer is estrogen receptor positive We have discussed numerous options for surgery including a lumpectomy and mastectomy with radioactive seed localization. We have talked about involving medical oncologist and radiation oncologist for  other forms of treatment and have outlined those treatments. We have discussed your preferences. We have decided to go ahead with left partial mastectomy with radioactive seed localization in the near future. you will be referred to medical oncology and radiation oncology postop. We have discussed the indications, techniques, and numerous risks of this surgery.  HISTORY OF CHOLECYSTECTOMY (V45.79  Z90.49) HISTORY OF CATARACT (V12.49  Z86.69) ANXIETY, MILD (300.00  F41.9) Impression: No medications currently FORMER SMOKER (Z66.06  T01.601) Impression: Quit 4 weeks ago CHRONIC GERD (530.81  K21.9) Impression: Controlled with Nexium    Edsel Petrin. Dalbert Batman, M.D., [facility] General and Minimally invasive Surgery Breast and Colorectal Surgery Office:   [phone] Pager:   [phone]

## 2015-01-04 ENCOUNTER — Ambulatory Visit (HOSPITAL_COMMUNITY): Payer: Medicare Other | Admitting: Certified Registered"

## 2015-01-04 ENCOUNTER — Ambulatory Visit
Admission: RE | Admit: 2015-01-04 | Discharge: 2015-01-04 | Disposition: A | Discharge status: Home / Self Care | Payer: 59 | Source: Ambulatory Visit | Attending: General Surgery | Admitting: General Surgery

## 2015-01-04 ENCOUNTER — Encounter (HOSPITAL_COMMUNITY): Payer: Self-pay | Admitting: *Deleted

## 2015-01-04 ENCOUNTER — Encounter (HOSPITAL_COMMUNITY)
Admission: RE | Disposition: A | Discharge status: Home / Self Care | Payer: Self-pay | Source: Ambulatory Visit | Attending: General Surgery

## 2015-01-04 ENCOUNTER — Ambulatory Visit (HOSPITAL_COMMUNITY)
Admission: RE | Admit: 2015-01-04 | Discharge: 2015-01-04 | Disposition: A | Discharge status: Home / Self Care | Payer: Medicare Other | Source: Ambulatory Visit | Attending: General Surgery | Admitting: General Surgery

## 2015-01-04 DIAGNOSIS — C50912 Malignant neoplasm of unspecified site of left female breast: Secondary | ICD-10-CM | POA: Diagnosis not present

## 2015-01-04 DIAGNOSIS — F419 Anxiety disorder, unspecified: Secondary | ICD-10-CM | POA: Insufficient documentation

## 2015-01-04 DIAGNOSIS — D0512 Intraductal carcinoma in situ of left breast: Secondary | ICD-10-CM | POA: Diagnosis not present

## 2015-01-04 DIAGNOSIS — Z803 Family history of malignant neoplasm of breast: Secondary | ICD-10-CM | POA: Insufficient documentation

## 2015-01-04 DIAGNOSIS — K219 Gastro-esophageal reflux disease without esophagitis: Secondary | ICD-10-CM | POA: Insufficient documentation

## 2015-01-04 DIAGNOSIS — Z170 Estrogen receptor positive status [ER+]: Secondary | ICD-10-CM | POA: Insufficient documentation

## 2015-01-04 DIAGNOSIS — Z87891 Personal history of nicotine dependence: Secondary | ICD-10-CM | POA: Diagnosis not present

## 2015-01-04 DIAGNOSIS — Z79899 Other long term (current) drug therapy: Secondary | ICD-10-CM | POA: Insufficient documentation

## 2015-01-04 DIAGNOSIS — N189 Chronic kidney disease, unspecified: Secondary | ICD-10-CM | POA: Diagnosis not present

## 2015-01-04 DIAGNOSIS — C50412 Malignant neoplasm of upper-outer quadrant of left female breast: Secondary | ICD-10-CM

## 2015-01-04 HISTORY — PX: BREAST LUMPECTOMY WITH RADIOACTIVE SEED LOCALIZATION: SHX6424

## 2015-01-04 SURGERY — BREAST LUMPECTOMY WITH RADIOACTIVE SEED LOCALIZATION
Anesthesia: General | Laterality: Left

## 2015-01-04 MED ORDER — OXYCODONE HCL 5 MG PO TABS: 5.0000 mg | ORAL_TABLET | Freq: Once | ORAL | Status: DC | PRN

## 2015-01-04 MED ORDER — DEXAMETHASONE SODIUM PHOSPHATE 4 MG/ML IJ SOLN
INTRAMUSCULAR | Status: AC
Start: 2015-01-04 — End: 2015-01-04
  Filled 2015-01-04: qty 1

## 2015-01-04 MED ORDER — HYDROMORPHONE HCL 1 MG/ML IJ SOLN
INTRAMUSCULAR | Status: AC
  Administered 2015-01-04: 0.25 mg via INTRAVENOUS
  Filled 2015-01-04: qty 1

## 2015-01-04 MED ORDER — BUPIVACAINE-EPINEPHRINE 0.25% -1:200000 IJ SOLN
INTRAMUSCULAR | Status: DC | PRN
  Administered 2015-01-04: 10 mL

## 2015-01-04 MED ORDER — EPHEDRINE SULFATE 50 MG/ML IJ SOLN
INTRAMUSCULAR | Status: AC
  Filled 2015-01-04: qty 2

## 2015-01-04 MED ORDER — ROCURONIUM BROMIDE 50 MG/5ML IV SOLN
INTRAVENOUS | Status: AC
  Filled 2015-01-04: qty 1

## 2015-01-04 MED ORDER — LACTATED RINGERS IV SOLN
INTRAVENOUS | Status: DC
  Administered 2015-01-04 (×3): via INTRAVENOUS

## 2015-01-04 MED ORDER — FENTANYL CITRATE (PF) 250 MCG/5ML IJ SOLN
INTRAMUSCULAR | Status: AC
  Filled 2015-01-04: qty 5

## 2015-01-04 MED ORDER — 0.9 % SODIUM CHLORIDE (POUR BTL) OPTIME
TOPICAL | Status: DC | PRN
  Administered 2015-01-04: 1000 mL

## 2015-01-04 MED ORDER — HYDROCODONE-ACETAMINOPHEN 5-325 MG PO TABS: 1.0000 | ORAL_TABLET | Freq: Four times a day (QID) | ORAL | Status: DC | PRN

## 2015-01-04 MED ORDER — ONDANSETRON HCL 4 MG/2ML IJ SOLN
INTRAMUSCULAR | Status: AC
  Filled 2015-01-04: qty 2

## 2015-01-04 MED ORDER — PROPOFOL 10 MG/ML IV BOLUS
INTRAVENOUS | Status: DC | PRN
  Administered 2015-01-04: 120 mg via INTRAVENOUS

## 2015-01-04 MED ORDER — SODIUM CHLORIDE 0.9 % IJ SOLN
INTRAMUSCULAR | Status: AC
  Filled 2015-01-04: qty 10

## 2015-01-04 MED ORDER — PROPOFOL 10 MG/ML IV BOLUS
INTRAVENOUS | Status: AC
  Filled 2015-01-04: qty 20

## 2015-01-04 MED ORDER — MIDAZOLAM HCL 2 MG/2ML IJ SOLN
INTRAMUSCULAR | Status: AC
  Filled 2015-01-04: qty 2

## 2015-01-04 MED ORDER — HYDROMORPHONE HCL 1 MG/ML IJ SOLN
0.2500 mg | INTRAMUSCULAR | Status: DC | PRN
  Administered 2015-01-04: 0.5 mg via INTRAVENOUS
  Administered 2015-01-04: 0.25 mg via INTRAVENOUS

## 2015-01-04 MED ORDER — NEOSTIGMINE METHYLSULFATE 10 MG/10ML IV SOLN
INTRAVENOUS | Status: DC | PRN
  Administered 2015-01-04: 4 mg via INTRAVENOUS

## 2015-01-04 MED ORDER — FENTANYL CITRATE (PF) 100 MCG/2ML IJ SOLN
INTRAMUSCULAR | Status: DC | PRN
  Administered 2015-01-04: 50 ug via INTRAVENOUS
  Administered 2015-01-04: 100 ug via INTRAVENOUS
  Administered 2015-01-04 (×2): 50 ug via INTRAVENOUS

## 2015-01-04 MED ORDER — GLYCOPYRROLATE 0.2 MG/ML IJ SOLN
INTRAMUSCULAR | Status: DC | PRN
  Administered 2015-01-04: 0.6 mg via INTRAVENOUS

## 2015-01-04 MED ORDER — DEXAMETHASONE SODIUM PHOSPHATE 4 MG/ML IJ SOLN
INTRAMUSCULAR | Status: DC | PRN
  Administered 2015-01-04: 4 mg via INTRAVENOUS

## 2015-01-04 MED ORDER — ROCURONIUM BROMIDE 100 MG/10ML IV SOLN
INTRAVENOUS | Status: DC | PRN
  Administered 2015-01-04: 40 mg via INTRAVENOUS

## 2015-01-04 MED ORDER — OXYCODONE HCL 5 MG/5ML PO SOLN: 5.0000 mg | Freq: Once | ORAL | Status: DC | PRN

## 2015-01-04 MED ORDER — ONDANSETRON HCL 4 MG/2ML IJ SOLN
INTRAMUSCULAR | Status: DC | PRN
  Administered 2015-01-04: 4 mg via INTRAVENOUS

## 2015-01-04 MED ORDER — MIDAZOLAM HCL 5 MG/5ML IJ SOLN
INTRAMUSCULAR | Status: DC | PRN
  Administered 2015-01-04: 2 mg via INTRAVENOUS

## 2015-01-04 MED ORDER — ONDANSETRON HCL 4 MG/2ML IJ SOLN
4.0000 mg | Freq: Four times a day (QID) | INTRAMUSCULAR | Status: AC | PRN
  Administered 2015-01-04: 4 mg via INTRAVENOUS

## 2015-01-04 MED ORDER — LIDOCAINE HCL (CARDIAC) 20 MG/ML IV SOLN
INTRAVENOUS | Status: DC | PRN
  Administered 2015-01-04: 50 mg via INTRAVENOUS

## 2015-01-04 MED ORDER — LIDOCAINE HCL (CARDIAC) 20 MG/ML IV SOLN
INTRAVENOUS | Status: AC
  Filled 2015-01-04: qty 5

## 2015-01-04 SURGICAL SUPPLY — 45 items
ADH SKN CLS APL DERMABOND .7 (GAUZE/BANDAGES/DRESSINGS) ×1
APPLIER CLIP 9.375 MED OPEN (MISCELLANEOUS) ×3
APR CLP MED 9.3 20 MLT OPN (MISCELLANEOUS) ×1
BINDER BREAST LRG (GAUZE/BANDAGES/DRESSINGS) IMPLANT
BINDER BREAST XLRG (GAUZE/BANDAGES/DRESSINGS) ×2 IMPLANT
BLADE SURG 15 STRL LF DISP TIS (BLADE) ×1 IMPLANT
BLADE SURG 15 STRL SS (BLADE) ×3
CANISTER SUCTION 2500CC (MISCELLANEOUS) ×2 IMPLANT
CHLORAPREP W/TINT 26ML (MISCELLANEOUS) ×3 IMPLANT
CLIP APPLIE 9.375 MED OPEN (MISCELLANEOUS) IMPLANT
COVER PROBE W GEL 5X96 (DRAPES) ×3 IMPLANT
DERMABOND ADVANCED (GAUZE/BANDAGES/DRESSINGS) ×2
DERMABOND ADVANCED .7 DNX12 (GAUZE/BANDAGES/DRESSINGS) IMPLANT
DEVICE DUBIN SPECIMEN MAMMOGRA (MISCELLANEOUS) ×3 IMPLANT
DRAPE CHEST BREAST 15X10 FENES (DRAPES) ×3 IMPLANT
DRSG PAD ABDOMINAL 8X10 ST (GAUZE/BANDAGES/DRESSINGS) ×2 IMPLANT
ELECT CAUTERY BLADE 6.4 (BLADE) ×3 IMPLANT
ELECT REM PT RETURN 9FT ADLT (ELECTROSURGICAL) ×3
ELECTRODE REM PT RTRN 9FT ADLT (ELECTROSURGICAL) ×1 IMPLANT
GLOVE EUDERMIC 7 POWDERFREE (GLOVE) ×3 IMPLANT
GOWN STRL REUS W/ TWL LRG LVL3 (GOWN DISPOSABLE) ×1 IMPLANT
GOWN STRL REUS W/ TWL XL LVL3 (GOWN DISPOSABLE) ×1 IMPLANT
GOWN STRL REUS W/TWL LRG LVL3 (GOWN DISPOSABLE) ×3
GOWN STRL REUS W/TWL XL LVL3 (GOWN DISPOSABLE) ×3
KIT BASIN OR (CUSTOM PROCEDURE TRAY) ×3 IMPLANT
KIT MARKER MARGIN INK (KITS) ×3 IMPLANT
NDL HYPO 25X1 1.5 SAFETY (NEEDLE) ×1 IMPLANT
NEEDLE HYPO 25X1 1.5 SAFETY (NEEDLE) ×3 IMPLANT
NS IRRIG 1000ML POUR BTL (IV SOLUTION) IMPLANT
PACK SURGICAL SETUP 50X90 (CUSTOM PROCEDURE TRAY) ×3 IMPLANT
PAD ABD 8X10 STRL (GAUZE/BANDAGES/DRESSINGS) ×2 IMPLANT
PENCIL BUTTON HOLSTER BLD 10FT (ELECTRODE) ×3 IMPLANT
SPONGE LAP 18X18 X RAY DECT (DISPOSABLE) ×3 IMPLANT
SUT MNCRL AB 4-0 PS2 18 (SUTURE) ×3 IMPLANT
SUT SILK 2 0 SH (SUTURE) IMPLANT
SUT VIC AB 2-0 SH 27 (SUTURE) ×3
SUT VIC AB 2-0 SH 27XBRD (SUTURE) ×1 IMPLANT
SUT VIC AB 3-0 SH 27 (SUTURE) ×3
SUT VIC AB 3-0 SH 27X BRD (SUTURE) ×1 IMPLANT
SYR CONTROL 10ML LL (SYRINGE) ×3 IMPLANT
TOWEL OR 17X24 6PK STRL BLUE (TOWEL DISPOSABLE) ×3 IMPLANT
TOWEL OR 17X26 10 PK STRL BLUE (TOWEL DISPOSABLE) ×3 IMPLANT
TUBE CONNECTING 12'X1/4 (SUCTIONS) ×1
TUBE CONNECTING 12X1/4 (SUCTIONS) ×1 IMPLANT
YANKAUER SUCT BULB TIP NO VENT (SUCTIONS) IMPLANT

## 2015-01-04 NOTE — Transfer of Care (Signed)
Immediate Anesthesia Transfer of Care Note  Patient: [name]  Procedure(s) Performed: Procedure(s): LEFT PARTIAL MASTECTOMY WITH RADIOACTIVE SEED LOCALIZATION (Left)  Patient Location: PACU  Anesthesia Type:General  Level of Consciousness: awake, alert  and oriented  Airway & Oxygen Therapy: Patient Spontanous Breathing and Patient connected to nasal cannula oxygen  Post-op Assessment: Report given to RN and Post -op Vital signs reviewed and stable  Post vital signs: Reviewed and stable  Last Vitals:  Filed Vitals:   01/04/15 0850  BP: 160/55  Pulse: 60  Temp: 36.4 C  Resp: 20    Complications: No apparent anesthesia complications

## 2015-01-04 NOTE — Anesthesia Postprocedure Evaluation (Signed)
Anesthesia Post Note  Patient: [name]  Procedure(s) Performed: Procedure(s) (LRB): LEFT PARTIAL MASTECTOMY WITH RADIOACTIVE SEED LOCALIZATION (Left)  Anesthesia type: General  Patient location: PACU  Post pain: Pain level controlled and Adequate analgesia  Post assessment: Post-op Vital signs reviewed, Patient's Cardiovascular Status Stable, Respiratory Function Stable, Patent Airway and Pain level controlled  Last Vitals:  Filed Vitals:   01/04/15 1105  BP:   Pulse: 58  Temp:   Resp: 15    Post vital signs: Reviewed and stable  Level of consciousness: awake, alert  and oriented  Complications: No apparent anesthesia complications

## 2015-01-04 NOTE — Anesthesia Procedure Notes (Signed)
Procedure Name: Intubation Performed by: Gean Maidens Pre-anesthesia Checklist: Patient identified, Emergency Drugs available, Suction available, Timeout performed and Patient being monitored Patient Re-evaluated:Patient Re-evaluated prior to inductionOxygen Delivery Method: Circle system utilized Preoxygenation: Pre-oxygenation with 100% oxygen Intubation Type: IV induction Ventilation: Mask ventilation without difficulty Laryngoscope Size: Mac and 3 Grade View: Grade I Tube type: Oral Tube size: 7.0 mm Number of attempts: 1 Secured at: 21 cm Tube secured with: Tape Dental Injury: Teeth and Oropharynx as per pre-operative assessment

## 2015-01-04 NOTE — Anesthesia Preprocedure Evaluation (Signed)
Anesthesia Evaluation  Patient identified by MRN, date of birth, ID band Patient awake    Reviewed: Allergy & Precautions, NPO status , Patient's Chart, lab work & pertinent test results  History of Anesthesia Complications (+) PONV  Airway Mallampati: II   Neck ROM: full    Dental   Pulmonary former smoker,  breath sounds clear to auscultation        Cardiovascular negative cardio ROS  Rhythm:regular Rate:Normal     Neuro/Psych Anxiety    GI/Hepatic GERD-  ,  Endo/Other    Renal/GU      Musculoskeletal   Abdominal   Peds  Hematology   Anesthesia Other Findings   Reproductive/Obstetrics                             Anesthesia Physical Anesthesia Plan  ASA: II  Anesthesia Plan: General   Post-op Pain Management:    Induction: Intravenous  Airway Management Planned: Oral ETT  Additional Equipment:   Intra-op Plan:   Post-operative Plan: Extubation in OR  Informed Consent: I have reviewed the patients History and Physical, chart, labs and discussed the procedure including the risks, benefits and alternatives for the proposed anesthesia with the patient or authorized representative who has indicated his/her understanding and acceptance.     Plan Discussed with: CRNA, Anesthesiologist and Surgeon  Anesthesia Plan Comments:         Anesthesia Quick Evaluation

## 2015-01-04 NOTE — Op Note (Signed)
Patient Name:           [name]   Date of Surgery:        01/04/2015  Pre op Diagnosis:      Ductal carcinoma in situ left breast, upper outer quadrant  Post op Diagnosis:    same  Procedure:                 Left partial mastectomy with radioactive seed localization  Surgeon:                     Edsel Petrin. Dalbert Batman, M.D., FACS  Assistant:                      OR staff  Operative Indications:    The patient is a 64 year old female who presents with breast cancer. She is referred by Dr. 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 Dr. Zada Girt is her PCP. She has no prior history of breast cancer or breast disease. Last mammogram 2 years ago. Recent mammogram show a 21 mm area of microcalcifications in the left breast, upper outer quadrant. Her breasts are very dense. Image guided biopsy shows high-grade ductal carcinoma in situ, estrogen receptor 100%, progesterone receptor 0. Subsequent MRI shows this is a solitary finding, with biopsy changes only. Nodes normal.   Family history reveals breast cancer in a maternal aunt and a maternal cousin. No ovarian cancer. We had a long talk about the management of breast cancer in general, her stage of disease, and her options. She is strongly mated motivated for breast conservation surgery. she is brought to the operating room electively.  Operative Findings:       The microcalcifications, marker clip, and radioactive seed were all visible within the specimen. There appeared to be significant margin around this. There were scattered calcifications noted in the specimen. This was discussed with Dr. Melanee Spry  Procedure in Detail:          Using the neoprobe I identified the I-125 signal in the left breast, upper outer quadrant preoperatively in the holding area. The patient was taken to the operating room and underwent general  anesthesia with LMA device. The left breast was prepped and draped in a sterile fashion. Intravenous antibiotics were given. Surgical timeout performed. 0.5% Marcaine with epinephrine was used as a local infiltration anesthetic.    Using the neoprobe as a guide, I identified the area of maximum radioactivity and made a curvilinear circumareolar incision in the skin, upper outer quadrant left breast, fairly lateral. Using the neoprobe as a guide I dissected down deep into the breast tissue. The radioactive seed was in the posterior third and in fact the lateral edge of the pectoralis major muscle was part of the posterior margin. The specimen was removed and marked with silk sutures and a 6 color ink kit  To orient the pathologist. Specimen mammogram looked good as described above. It was marked and sent to the lab.     The wound was irrigated with saline. Hemostasis was excellent. Lumpectomy cavity was marked with metallic clips. The breast tissues were reapproximated with 3-0 Vicryl sutures and skin closed with a running subcuticular 4-0 Monocryl and Dermabond. Breast binder was placed and the patient taken to PACU in stable condition. EBL 10 mL. Counts correct. Complications none.  Edsel Petrin. Dalbert Batman, M.D., FACS General and Minimally Invasive Surgery Breast and Colorectal Surgery  01/04/2015 10:36 AM

## 2015-01-04 NOTE — Progress Notes (Signed)
Report rec'd from Mila Doce, Assuming care at this time

## 2015-01-04 NOTE — Interval H&P Note (Signed)
History and Physical Interval Note:  01/04/2015 9:02 AM  [name]  has presented today for surgery, with the diagnosis of left breast cancer  The goals and the various methods of treatment have been discussed with the patient and family. After consideration of risks, benefits and other options for treatment, the patient has consented to  Procedure(s): LEFT PARTIAL MASTECTOMY WITH RADIOACTIVE SEED LOCALIZATION (Left) as a surgical intervention .  The patient's history has been reviewed, patient examined today, no change in status, stable for surgery.  I have reviewed the patient's chart and labs.  Questions were answered to the patient's satisfaction.     [name]

## 2015-01-04 NOTE — Discharge Instructions (Signed)
[facility] °Office Phone Number [phone] ° °BREAST BIOPSY/ PARTIAL MASTECTOMY: POST OP INSTRUCTIONS ° °Always review your discharge instruction sheet given to you by the facility where your surgery was performed. ° °IF YOU HAVE DISABILITY OR FAMILY LEAVE FORMS, YOU MUST BRING THEM TO THE OFFICE FOR PROCESSING.  DO NOT GIVE THEM TO YOUR DOCTOR. ° °1. A prescription for pain medication may be given to you upon discharge.  Take your pain medication as prescribed, if needed.  If narcotic pain medicine is not needed, then you may take acetaminophen (Tylenol) or ibuprofen (Advil) as needed. °2. Take your usually prescribed medications unless otherwise directed °3. If you need a refill on your pain medication, please contact your pharmacy.  They will contact our office to request authorization.  Prescriptions will not be filled after 5pm or on week-ends. °4. You should eat very light the first 24 hours after surgery, such as soup, crackers, pudding, etc.  Resume your normal diet the day after surgery. °5. Most patients will experience some swelling and bruising in the breast.  Ice packs and a good support bra will help.  Swelling and bruising can take several days to resolve.  °6. It is common to experience some constipation if taking pain medication after surgery.  Increasing fluid intake and taking a stool softener will usually help or prevent this problem from occurring.  A mild laxative (Milk of Magnesia or Miralax) should be taken according to package directions if there are no bowel movements after 48 hours. °7. Unless discharge instructions indicate otherwise, you may remove your bandages 24-48 hours after surgery, and you may shower at that time.  You may have steri-strips (small skin tapes) in place directly over the incision.  These strips should be left on the skin for 7-10 days.  If your surgeon used skin glue on the incision, you may shower in 24 hours.  The glue will flake off over the  next 2-3 weeks.  Any sutures or staples will be removed at the office during your follow-up visit. °8. ACTIVITIES:  You may resume regular daily activities (gradually increasing) beginning the next day.  Wearing a good support bra or sports bra minimizes pain and swelling.  You may have sexual intercourse when it is comfortable. °a. You may drive when you no longer are taking prescription pain medication, you can comfortably wear a seatbelt, and you can safely maneuver your car and apply brakes. °b. RETURN TO WORK:  ______________________________________________________________________________________ °9. You should see your doctor in the office for a follow-up appointment approximately two weeks after your surgery.  Your doctor’s nurse will typically make your follow-up appointment when she calls you with your pathology report.  Expect your pathology report 2-3 business days after your surgery.  You may call to check if you do not hear from us after three days. °10. OTHER INSTRUCTIONS: _______________________________________________________________________________________________ _____________________________________________________________________________________________________________________________________ °_____________________________________________________________________________________________________________________________________ °_____________________________________________________________________________________________________________________________________ ° °WHEN TO CALL YOUR DOCTOR: °1. Fever over 101.0 °2. Nausea and/or vomiting. °3. Extreme swelling or bruising. °4. Continued bleeding from incision. °5. Increased pain, redness, or drainage from the incision. ° °The clinic staff is available to answer your questions during regular business hours.  Please don’t hesitate to call and ask to speak to one of the nurses for clinical concerns.  If you have a medical emergency, go to the nearest  emergency room or call 911.  A surgeon from [facility] is always on call at the hospital. ° °For further questions, please visit [email or web address]  °

## 2015-01-05 ENCOUNTER — Encounter (HOSPITAL_COMMUNITY): Payer: Self-pay | Admitting: General Surgery

## 2015-01-10 ENCOUNTER — Other Ambulatory Visit: Payer: Self-pay | Admitting: General Surgery

## 2015-01-11 ENCOUNTER — Encounter (HOSPITAL_BASED_OUTPATIENT_CLINIC_OR_DEPARTMENT_OTHER): Payer: Self-pay | Admitting: *Deleted

## 2015-01-11 NOTE — H&P (Signed)
  [name]  Location: [facility] Patient #: [id number] DOB: 1950/12/04 Divorced / Language: English / Race: White Female       History of Present Illness       This is a 64 year old Caucasian female who underwent left partial mastectomy with radioactive seed localization for receptor positive DCIS on Jan 04, 2015. She is recovering without any major problems. Her final pathology report showed that the posterior and inferior margins are 1 mm or less and these need to be reexcised. I advised her to undergo left breast lumpectomy with reexcision of the posterior and inferior margins, and I explained the technique in detail of that with her. She is aware of the risk of bleeding, infection, further cosmetic deformity, further positive margins, skin necrosis, and other unforeseen problems. She understands these issues and all of her questions are answered. She agrees with the plan to re-excise the margins. I told her that she would be referred to radiation oncology and medical medical oncology postop.   Allergies  No Known Drug Allergies04/21/2016  Medication History Chantix (0.5MG  Tablet, Oral) Active. Multivitamins (Oral daily) Active. Omeprazole (40MG  Capsule DR, Oral daily) Active.  Vitals   Weight: 183.13 lb Height: 63in Body Surface Area: 1.92 m Body Mass Index: 32.44 kg/m Temp.: 98.35F(Oral)  Pulse: 74 (Regular)  BP: 140/78 (Sitting, Right Arm, Standard)    Physical Exam  General Note: Very pleasant. No distress. Alert and cooperative.   Head and Neck Note: No adenopathy or mass.   Chest and Lung Exam Note: Lungs are clear to auscultation. No chest wall tenderness.   Breast Note: Left breast incision upper outer quadrant looks good. Contour excellent. No sign of Infection. Maybe a small seroma. Minimally tender.   Cardiovascular Note: Regular rate and rhythm. No murmur. No ectopy. Radial pulses  intact.     Assessment & Plan PRIMARY CANCER OF UPPER OUTER QUADRANT OF LEFT FEMALE BREAST (174.4  C50.412)   Schedule for Surgery Your left breast incision looks good and you seem to be healing without any obvious surgical complications. The final pathology report shows ductal carcinoma in situ, estrogen receptor positive. The posterior and inferior margins are very close, and they need to be reexcised to do the best job we can to control your disease. We will schedule that surgery at your convenience in the near future We discussed the techniques and risks of this surgery in detail. If the second surgery goes well, then you will be referred to medical oncology and radiation oncology postop.  HISTORY OF CHOLECYSTECTOMY (V45.79  Z90.49)  HISTORY OF CATARACT (V12.49  Z86.69)  FORMER SMOKER (V15.82  Z87.891)  ANXIETY, MILD (300.00  F41.9)  CHRONIC GERD (530.81  K21.9)    Muriah Harsha M. Dalbert Batman, M.D., [facility] General and Minimally invasive Surgery Breast and Colorectal Surgery Office:   [phone] Pager:   [phone]

## 2015-01-12 ENCOUNTER — Ambulatory Visit (HOSPITAL_BASED_OUTPATIENT_CLINIC_OR_DEPARTMENT_OTHER)
Admission: RE | Admit: 2015-01-12 | Discharge: 2015-01-12 | Disposition: A | Discharge status: Home / Self Care | Payer: Medicare Other | Source: Ambulatory Visit | Attending: General Surgery | Admitting: General Surgery

## 2015-01-12 ENCOUNTER — Ambulatory Visit (HOSPITAL_BASED_OUTPATIENT_CLINIC_OR_DEPARTMENT_OTHER): Payer: Medicare Other | Admitting: Anesthesiology

## 2015-01-12 ENCOUNTER — Encounter (HOSPITAL_BASED_OUTPATIENT_CLINIC_OR_DEPARTMENT_OTHER): Payer: Self-pay | Admitting: Anesthesiology

## 2015-01-12 ENCOUNTER — Encounter (HOSPITAL_BASED_OUTPATIENT_CLINIC_OR_DEPARTMENT_OTHER)
Admission: RE | Disposition: A | Discharge status: Home / Self Care | Payer: Self-pay | Source: Ambulatory Visit | Attending: General Surgery

## 2015-01-12 DIAGNOSIS — C50912 Malignant neoplasm of unspecified site of left female breast: Secondary | ICD-10-CM | POA: Diagnosis not present

## 2015-01-12 DIAGNOSIS — C50412 Malignant neoplasm of upper-outer quadrant of left female breast: Secondary | ICD-10-CM | POA: Diagnosis present

## 2015-01-12 DIAGNOSIS — Z7982 Long term (current) use of aspirin: Secondary | ICD-10-CM | POA: Insufficient documentation

## 2015-01-12 DIAGNOSIS — C50812 Malignant neoplasm of overlapping sites of left female breast: Secondary | ICD-10-CM | POA: Diagnosis not present

## 2015-01-12 DIAGNOSIS — N641 Fat necrosis of breast: Secondary | ICD-10-CM | POA: Diagnosis not present

## 2015-01-12 DIAGNOSIS — D0512 Intraductal carcinoma in situ of left breast: Secondary | ICD-10-CM | POA: Diagnosis not present

## 2015-01-12 DIAGNOSIS — F419 Anxiety disorder, unspecified: Secondary | ICD-10-CM | POA: Diagnosis not present

## 2015-01-12 DIAGNOSIS — Z9049 Acquired absence of other specified parts of digestive tract: Secondary | ICD-10-CM | POA: Diagnosis not present

## 2015-01-12 DIAGNOSIS — Z87891 Personal history of nicotine dependence: Secondary | ICD-10-CM | POA: Diagnosis not present

## 2015-01-12 DIAGNOSIS — K219 Gastro-esophageal reflux disease without esophagitis: Secondary | ICD-10-CM | POA: Insufficient documentation

## 2015-01-12 HISTORY — PX: RE-EXCISION OF BREAST LUMPECTOMY: SHX6048

## 2015-01-12 SURGERY — EXCISION, LESION, BREAST
Anesthesia: General | Site: Breast | Laterality: Left

## 2015-01-12 MED ORDER — FENTANYL CITRATE (PF) 100 MCG/2ML IJ SOLN: 25.0000 ug | INTRAMUSCULAR | Status: DC | PRN

## 2015-01-12 MED ORDER — PROMETHAZINE HCL 25 MG/ML IJ SOLN: 6.2500 mg | INTRAMUSCULAR | Status: DC | PRN

## 2015-01-12 MED ORDER — GLYCOPYRROLATE 0.2 MG/ML IJ SOLN: 0.2000 mg | Freq: Once | INTRAMUSCULAR | Status: DC | PRN

## 2015-01-12 MED ORDER — HYDROCODONE-ACETAMINOPHEN 7.5-325 MG PO TABS: 1.0000 | ORAL_TABLET | Freq: Once | ORAL | Status: DC | PRN

## 2015-01-12 MED ORDER — CEFAZOLIN SODIUM-DEXTROSE 2-3 GM-% IV SOLR
INTRAVENOUS | Status: AC
  Filled 2015-01-12: qty 50

## 2015-01-12 MED ORDER — FENTANYL CITRATE (PF) 100 MCG/2ML IJ SOLN
INTRAMUSCULAR | Status: DC | PRN
  Administered 2015-01-12 (×2): 50 ug via INTRAVENOUS

## 2015-01-12 MED ORDER — HYDROMORPHONE HCL 1 MG/ML IJ SOLN
0.2500 mg | INTRAMUSCULAR | Status: DC | PRN
  Administered 2015-01-12 (×3): 0.5 mg via INTRAVENOUS

## 2015-01-12 MED ORDER — BUPIVACAINE-EPINEPHRINE (PF) 0.5% -1:200000 IJ SOLN
INTRAMUSCULAR | Status: AC
  Filled 2015-01-12: qty 30

## 2015-01-12 MED ORDER — CEFAZOLIN SODIUM-DEXTROSE 2-3 GM-% IV SOLR
2.0000 g | INTRAVENOUS | Status: AC
  Administered 2015-01-12: 2 g via INTRAVENOUS

## 2015-01-12 MED ORDER — LIDOCAINE-EPINEPHRINE (PF) 1 %-1:200000 IJ SOLN
INTRAMUSCULAR | Status: AC
  Filled 2015-01-12: qty 10

## 2015-01-12 MED ORDER — ONDANSETRON HCL 4 MG/2ML IJ SOLN
INTRAMUSCULAR | Status: DC | PRN
  Administered 2015-01-12: 4 mg via INTRAVENOUS

## 2015-01-12 MED ORDER — LIDOCAINE HCL (CARDIAC) 20 MG/ML IV SOLN
INTRAVENOUS | Status: DC | PRN
  Administered 2015-01-12: 50 mg via INTRAVENOUS

## 2015-01-12 MED ORDER — CHLORHEXIDINE GLUCONATE 4 % EX LIQD: 1.0000 "application " | Freq: Once | CUTANEOUS | Status: DC

## 2015-01-12 MED ORDER — MIDAZOLAM HCL 2 MG/2ML IJ SOLN
INTRAMUSCULAR | Status: AC
  Filled 2015-01-12: qty 2

## 2015-01-12 MED ORDER — MIDAZOLAM HCL 5 MG/5ML IJ SOLN
INTRAMUSCULAR | Status: DC | PRN
  Administered 2015-01-12: 2 mg via INTRAVENOUS

## 2015-01-12 MED ORDER — HYDROMORPHONE HCL 1 MG/ML IJ SOLN
INTRAMUSCULAR | Status: AC
  Filled 2015-01-12: qty 1

## 2015-01-12 MED ORDER — FENTANYL CITRATE (PF) 100 MCG/2ML IJ SOLN
INTRAMUSCULAR | Status: AC
  Filled 2015-01-12: qty 6

## 2015-01-12 MED ORDER — LIDOCAINE HCL (PF) 1 % IJ SOLN
INTRAMUSCULAR | Status: AC
  Filled 2015-01-12: qty 30

## 2015-01-12 MED ORDER — PROMETHAZINE HCL 25 MG/ML IJ SOLN
6.2500 mg | INTRAMUSCULAR | Status: DC | PRN
Start: 2015-01-12 — End: 2015-01-12

## 2015-01-12 MED ORDER — PROPOFOL 10 MG/ML IV BOLUS
INTRAVENOUS | Status: DC | PRN
  Administered 2015-01-12: 150 mg via INTRAVENOUS
  Administered 2015-01-12: 50 mg via INTRAVENOUS

## 2015-01-12 MED ORDER — SCOPOLAMINE 1 MG/3DAYS TD PT72
MEDICATED_PATCH | TRANSDERMAL | Status: AC
  Filled 2015-01-12: qty 1

## 2015-01-12 MED ORDER — BUPIVACAINE-EPINEPHRINE 0.5% -1:200000 IJ SOLN
INTRAMUSCULAR | Status: DC | PRN
  Administered 2015-01-12: 10 mL

## 2015-01-12 MED ORDER — LACTATED RINGERS IV SOLN
INTRAVENOUS | Status: DC
  Administered 2015-01-12 (×2): via INTRAVENOUS

## 2015-01-12 MED ORDER — MIDAZOLAM HCL 2 MG/2ML IJ SOLN
1.0000 mg | INTRAMUSCULAR | Status: DC | PRN
Start: 2015-01-12 — End: 2015-01-12

## 2015-01-12 MED ORDER — DEXAMETHASONE SODIUM PHOSPHATE 4 MG/ML IJ SOLN
INTRAMUSCULAR | Status: DC | PRN
  Administered 2015-01-12: 10 mg via INTRAVENOUS

## 2015-01-12 MED ORDER — FENTANYL CITRATE (PF) 100 MCG/2ML IJ SOLN: 50.0000 ug | INTRAMUSCULAR | Status: DC | PRN

## 2015-01-12 MED ORDER — SCOPOLAMINE 1 MG/3DAYS TD PT72
1.0000 | MEDICATED_PATCH | Freq: Once | TRANSDERMAL | Status: DC
  Administered 2015-01-12: 1.5 mg via TRANSDERMAL

## 2015-01-12 MED ORDER — PROPOFOL 10 MG/ML IV BOLUS: INTRAVENOUS | Status: DC | PRN

## 2015-01-12 SURGICAL SUPPLY — 61 items
ADH SKN CLS APL DERMABOND .7 (GAUZE/BANDAGES/DRESSINGS) ×1
APL SKNCLS STERI-STRIP NONHPOA (GAUZE/BANDAGES/DRESSINGS)
APPLIER CLIP 9.375 MED OPEN (MISCELLANEOUS) ×3
APR CLP MED 9.3 20 MLT OPN (MISCELLANEOUS) ×1
BANDAGE ELASTIC 6 VELCRO ST LF (GAUZE/BANDAGES/DRESSINGS) IMPLANT
BENZOIN TINCTURE PRP APPL 2/3 (GAUZE/BANDAGES/DRESSINGS) IMPLANT
BLADE HEX COATED 2.75 (ELECTRODE) ×3 IMPLANT
BLADE SURG 15 STRL LF DISP TIS (BLADE) ×2 IMPLANT
BLADE SURG 15 STRL SS (BLADE) ×6
CANISTER SUCT 1200ML W/VALVE (MISCELLANEOUS) ×3 IMPLANT
CHLORAPREP W/TINT 26ML (MISCELLANEOUS) ×3 IMPLANT
CLIP APPLIE 9.375 MED OPEN (MISCELLANEOUS) ×1 IMPLANT
CLOSURE WOUND 1/2 X4 (GAUZE/BANDAGES/DRESSINGS)
COVER BACK TABLE 60X90IN (DRAPES) ×3 IMPLANT
COVER MAYO STAND STRL (DRAPES) ×3 IMPLANT
DECANTER SPIKE VIAL GLASS SM (MISCELLANEOUS) IMPLANT
DERMABOND ADVANCED (GAUZE/BANDAGES/DRESSINGS) ×2
DERMABOND ADVANCED .7 DNX12 (GAUZE/BANDAGES/DRESSINGS) IMPLANT
DRAPE LAPAROTOMY 100X72 PEDS (DRAPES) ×3 IMPLANT
DRAPE LAPAROTOMY TRNSV 102X78 (DRAPE) IMPLANT
DRAPE UTILITY XL STRL (DRAPES) ×3 IMPLANT
ELECT REM PT RETURN 9FT ADLT (ELECTROSURGICAL) ×3
ELECTRODE REM PT RTRN 9FT ADLT (ELECTROSURGICAL) ×1 IMPLANT
GAUZE SPONGE 4X4 16PLY XRAY LF (GAUZE/BANDAGES/DRESSINGS) IMPLANT
GLOVE BIOGEL PI IND STRL 7.0 (GLOVE) IMPLANT
GLOVE BIOGEL PI INDICATOR 7.0 (GLOVE) ×2
GLOVE ECLIPSE 6.5 STRL STRAW (GLOVE) ×2 IMPLANT
GLOVE EUDERMIC 7 POWDERFREE (GLOVE) ×3 IMPLANT
GOWN STRL REUS W/ TWL LRG LVL3 (GOWN DISPOSABLE) ×1 IMPLANT
GOWN STRL REUS W/ TWL XL LVL3 (GOWN DISPOSABLE) ×1 IMPLANT
GOWN STRL REUS W/TWL LRG LVL3 (GOWN DISPOSABLE) ×3
GOWN STRL REUS W/TWL XL LVL3 (GOWN DISPOSABLE) ×3
KIT MARKER MARGIN INK (KITS) ×2 IMPLANT
NDL HYPO 25X1 1.5 SAFETY (NEEDLE) ×1 IMPLANT
NEEDLE HYPO 22GX1.5 SAFETY (NEEDLE) IMPLANT
NEEDLE HYPO 25X1 1.5 SAFETY (NEEDLE) ×3 IMPLANT
NS IRRIG 1000ML POUR BTL (IV SOLUTION) ×3 IMPLANT
PACK BASIN DAY SURGERY FS (CUSTOM PROCEDURE TRAY) ×3 IMPLANT
PENCIL BUTTON HOLSTER BLD 10FT (ELECTRODE) ×3 IMPLANT
SLEEVE SCD COMPRESS KNEE MED (MISCELLANEOUS) ×2 IMPLANT
SPONGE GAUZE 4X4 12PLY STER LF (GAUZE/BANDAGES/DRESSINGS) ×2 IMPLANT
SPONGE LAP 4X18 X RAY DECT (DISPOSABLE) ×3 IMPLANT
STAPLER VISISTAT 35W (STAPLE) IMPLANT
STRIP CLOSURE SKIN 1/2X4 (GAUZE/BANDAGES/DRESSINGS) IMPLANT
SUT ETHILON 4 0 PS 2 18 (SUTURE) IMPLANT
SUT MNCRL AB 4-0 PS2 18 (SUTURE) IMPLANT
SUT SILK 2 0 SH (SUTURE) ×3 IMPLANT
SUT VIC AB 2-0 SH 27 (SUTURE)
SUT VIC AB 2-0 SH 27XBRD (SUTURE) IMPLANT
SUT VIC AB 3-0 FS2 27 (SUTURE) IMPLANT
SUT VIC AB 4-0 P-3 18XBRD (SUTURE) IMPLANT
SUT VIC AB 4-0 P3 18 (SUTURE)
SUT VICRYL 3-0 CR8 SH (SUTURE) ×3 IMPLANT
SUT VICRYL 4-0 PS2 18IN ABS (SUTURE) IMPLANT
SYR BULB 3OZ (MISCELLANEOUS) IMPLANT
SYRINGE 10CC LL (SYRINGE) ×3 IMPLANT
TAPE HYPAFIX 4 X10 (GAUZE/BANDAGES/DRESSINGS) IMPLANT
TOWEL OR NON WOVEN STRL DISP B (DISPOSABLE) ×3 IMPLANT
TUBE CONNECTING 20'X1/4 (TUBING) ×1
TUBE CONNECTING 20X1/4 (TUBING) ×2 IMPLANT
YANKAUER SUCT BULB TIP NO VENT (SUCTIONS) ×3 IMPLANT

## 2015-01-12 NOTE — Interval H&P Note (Signed)
History and Physical Interval Note:  01/12/2015 10:39 AM  [name]  has presented today for surgery, with the diagnosis of left breast cancer  The various methods of treatment have been discussed with the patient and family. After consideration of risks, benefits and other options for treatment, the patient has consented to  Procedure(s): LEFT  BREAST LUMPECTOMY RE-EXCISION OF MARGINS (Left) as a surgical intervention .  The patient's history has been reviewed, patient examined, no change in status, stable for surgery.  I have reviewed the patient's chart and labs.  Questions were answered to the patient's satisfaction.     [name]

## 2015-01-12 NOTE — Anesthesia Postprocedure Evaluation (Signed)
  Anesthesia Post-op Note  Patient: [name]  Procedure(s) Performed: Procedure(s): LEFT  BREAST LUMPECTOMY RE-EXCISION OF MARGINS (Left)  Patient Location: PACU  Anesthesia Type:General  Level of Consciousness: awake and alert   Airway and Oxygen Therapy: Patient Spontanous Breathing  Post-op Pain: mild  Post-op Assessment: Post-op Vital signs reviewed  Post-op Vital Signs: Reviewed  Last Vitals:  Filed Vitals:   01/12/15 1300  BP: 163/77  Pulse: 76  Temp: 36.6 C  Resp: 16    Complications: No apparent anesthesia complications

## 2015-01-12 NOTE — Op Note (Signed)
Patient Name:           [name]   Date of Surgery:        01/12/2015  Pre op Diagnosis:      Ductal carcinoma in situ left breast, upper outer quadrant, receptor positive. Status post left partial mastectomy with very close margins   Post op Diagnosis:    Same  Procedure:                 Left partial mastectomy with reexcision of posterior margin and reexcision of inferior margin  Surgeon:                     Edsel Petrin. Dalbert Batman, M.D., FACS  Assistant:                      OR staff  Operative Indications:   This is a 64 year old Caucasian female who underwent left partial mastectomy with radioactive seed localization for receptor positive DCIS on Jan 04, 2015. She is recovering without any major problems. Her final pathology report showed that the posterior and inferior margins are 1 mm or less and these need to be reexcised. I advised her to undergo left breast lumpectomy with reexcision of the posterior and inferior margins, and I explained the technique in detail of that with her. She is aware of the risk of bleeding, infection, further cosmetic deformity, further positive margins, skin necrosis, and other unforeseen problems. She understands these issues and all of her questions are answered. She agrees with the plan to re-excise the margins. I told her that she would be referred to radiation oncology and medical medical oncology postop.  Operative Findings:       The breast tissues appeared healthy. I reexcised the posterior margin all the way down to and including a little bit of pectoral muscle. I re-excise the inferior margin. Both re-excisions were proximally 5-6 mm in thickness.  Procedure in Detail:          Following the induction of general endotracheal anesthesia, the left breast was prepped and draped in a sterile fashion. Prior to prepping we had removed all of the Dermabond adhesive. Intravenous antibodies were given. Surgical timeout was performed. 0.5% Marcaine with  epinephrine was used as local infiltration anesthetic.    Using a knife I carefully reopen the lumpectomy incision in the upper outer quadrant. I divided all of the Vicryl sutures holding the breast tissues together and then gained access to the lumpectomy cavity. There is no gross evidence of tumor. I reexcised the posterior margin all the way down to and including a little bit of the pectoral muscle. This was marked with ink and sent as a separate specimen. I then reexcised the inferior margin and marked it with a different color of ink and sent that as a separate specimen. Hemostasis was excellent and achieved with electrocautery. The wound was irrigated with saline. I placed metal clips in the lumpectomy cavity again to mark the lumpectomy cavity for radiation oncology. The breast tissues were closed with interrupted sutures of 3-0 Vicryl and skin closed with a running subcuticular suture of 4-0 Monocryl and Dermabond. Breast binder was placed and the patient taken to PACU in stable condition. EBL 10 mL. Counts correct. Complications none.     Edsel Petrin. Dalbert Batman, M.D., FACS General and Minimally Invasive Surgery Breast and Colorectal Surgery  01/12/2015 11:31 AM

## 2015-01-12 NOTE — Transfer of Care (Signed)
Immediate Anesthesia Transfer of Care Note  Patient: [name]  Procedure(s) Performed: Procedure(s): LEFT  BREAST LUMPECTOMY RE-EXCISION OF MARGINS (Left)  Patient Location: PACU  Anesthesia Type:General  Level of Consciousness: awake and patient cooperative  Airway & Oxygen Therapy: Patient Spontanous Breathing and Patient connected to face mask oxygen  Post-op Assessment: Report given to RN and Post -op Vital signs reviewed and stable  Post vital signs: Reviewed and stable  Last Vitals:  Filed Vitals:   01/12/15 0943  BP: 146/63  Pulse: 59  Temp: 36.7 C  Resp: 16    Complications: No apparent anesthesia complications

## 2015-01-12 NOTE — Discharge Instructions (Signed)
Post Anesthesia Home Care Instructions  Activity: Get plenty of rest for the remainder of the day. A responsible adult should stay with you for 24 hours following the procedure.  For the next 24 hours, DO NOT: -Drive a car -Paediatric nurse -Drink alcoholic beverages -Take any medication unless instructed by your physician -Make any legal decisions or sign important papers.  Meals: Start with liquid foods such as gelatin or soup. Progress to regular foods as tolerated. Avoid greasy, spicy, heavy foods. If nausea and/or vomiting occur, drink only clear liquids until the nausea and/or vomiting subsides. Call your physician if vomiting continues.  Special Instructions/Symptoms: Your throat may feel dry or sore from the anesthesia or the breathing tube placed in your throat during surgery. If this causes discomfort, gargle with warm salt water. The discomfort should disappear within 24 hours.  If you had a scopolamine patch placed behind your ear for the management of post- operative nausea and/or vomiting:  1. The medication in the patch is effective for 72 hours, after which it should be removed.  Wrap patch in a tissue and discard in the trash. Wash hands thoroughly with soap and water. 2. You may remove the patch earlier than 72 hours if you experience unpleasant side effects which may include dry mouth, dizziness or visual disturbances. 3. Avoid touching the patch. Wash your hands with soap and water after contact with the patch.          [place] Office Phone Number [phone]  BREAST BIOPSY/ Re-excision: POST OP INSTRUCTIONS  Always review your discharge instruction sheet given to you by the facility where your surgery was performed.  IF YOU HAVE DISABILITY OR FAMILY LEAVE FORMS, YOU MUST BRING THEM TO THE OFFICE FOR PROCESSING.  DO NOT GIVE THEM TO YOUR DOCTOR.  1. A prescription for pain medication may be given to you upon discharge.  Take your pain  medication as prescribed, if needed.  If narcotic pain medicine is not needed, then you may take acetaminophen (Tylenol) or ibuprofen (Advil) as needed. 2. Take your usually prescribed medications unless otherwise directed 3. If you need a refill on your pain medication, please contact your pharmacy.  They will contact our office to request authorization.  Prescriptions will not be filled after 5pm or on week-ends. 4. You should eat very light the first 24 hours after surgery, such as soup, crackers, pudding, etc.  Resume your normal diet the day after surgery. 5. Most patients will experience some swelling and bruising in the breast.  Ice packs and a good support bra will help.  Swelling and bruising can take several days to resolve.  6. It is common to experience some constipation if taking pain medication after surgery.  Increasing fluid intake and taking a stool softener will usually help or prevent this problem from occurring.  A mild laxative (Milk of Magnesia or Miralax) should be taken according to package directions if there are no bowel movements after 48 hours. 7. Unless discharge instructions indicate otherwise, you may remove your bandages 24-48 hours after surgery, and you may shower at that time.  You may have steri-strips (small skin tapes) in place directly over the incision.  These strips should be left on the skin for 7-10 days.  If your surgeon used skin glue on the incision, you may shower in 24 hours.  The glue will flake off over the next 2-3 weeks.  Any sutures or staples will be removed at the office during your  follow-up visit. 8. ACTIVITIES:  You may resume regular daily activities (gradually increasing) beginning the next day.  Wearing a good support bra or sports bra minimizes pain and swelling.  You may have sexual intercourse when it is comfortable. a. You may drive when you no longer are taking prescription pain medication, you can comfortably wear a seatbelt, and you can  safely maneuver your car and apply brakes. b. RETURN TO WORK:  ______________________________________________________________________________________ 9. You should see your doctor in the office for a follow-up appointment approximately two weeks after your surgery.  Your doctors nurse will typically make your follow-up appointment when she calls you with your pathology report.  Expect your pathology report 2-3 business days after your surgery.  You may call to check if you do not hear from [place] after three days. 10. OTHER INSTRUCTIONS: _______________________________________________________________________________________________ _____________________________________________________________________________________________________________________________________ _____________________________________________________________________________________________________________________________________ _____________________________________________________________________________________________________________________________________  WHEN TO CALL YOUR DOCTOR: 1. Fever over 101.0 2. Nausea and/or vomiting. 3. Extreme swelling or bruising. 4. Continued bleeding from incision. 5. Increased pain, redness, or drainage from the incision.  The clinic staff is available to answer your questions during regular business hours.  Please dont hesitate to call and ask to speak to one of the nurses for clinical concerns.  If you have a medical emergency, go to the nearest emergency room or call 911.  A surgeon from [facility] is always on call at the hospital.  For further questions, please visit [email or web address]

## 2015-01-12 NOTE — Anesthesia Procedure Notes (Signed)
Procedure Name: LMA Insertion Date/Time: 01/12/2015 10:56 AM Performed by: Toula Moos L Pre-anesthesia Checklist: Patient identified, Emergency Drugs available, Suction available, Patient being monitored and Timeout performed Patient Re-evaluated:Patient Re-evaluated prior to inductionOxygen Delivery Method: Circle System Utilized Preoxygenation: Pre-oxygenation with 100% oxygen Intubation Type: IV induction Ventilation: Mask ventilation without difficulty LMA: LMA inserted LMA Size: 4.0 Number of attempts: 1 Airway Equipment and Method: Bite block Placement Confirmation: positive ETCO2 Tube secured with: Tape Dental Injury: Teeth and Oropharynx as per pre-operative assessment

## 2015-01-12 NOTE — Anesthesia Preprocedure Evaluation (Signed)
Anesthesia Evaluation  Patient identified by MRN, date of birth, ID band Patient awake    Reviewed: Allergy & Precautions, NPO status , Patient's Chart, lab work & pertinent test results  History of Anesthesia Complications (+) PONV  Airway Mallampati: II  TM Distance: >3 FB Neck ROM: full    Dental   Pulmonary former smoker,  breath sounds clear to auscultation        Cardiovascular negative cardio ROS  Rhythm:regular Rate:Normal     Neuro/Psych Anxiety    GI/Hepatic GERD-  ,  Endo/Other    Renal/GU      Musculoskeletal   Abdominal   Peds  Hematology   Anesthesia Other Findings   Reproductive/Obstetrics                             Anesthesia Physical  Anesthesia Plan  ASA: II  Anesthesia Plan: General   Post-op Pain Management:    Induction: Intravenous  Airway Management Planned: LMA  Additional Equipment:   Intra-op Plan:   Post-operative Plan: Extubation in OR  Informed Consent: I have reviewed the patients History and Physical, chart, labs and discussed the procedure including the risks, benefits and alternatives for the proposed anesthesia with the patient or authorized representative who has indicated his/her understanding and acceptance.     Plan Discussed with: CRNA  Anesthesia Plan Comments:         Anesthesia Quick Evaluation

## 2015-01-14 ENCOUNTER — Encounter (HOSPITAL_BASED_OUTPATIENT_CLINIC_OR_DEPARTMENT_OTHER): Payer: Self-pay | Admitting: General Surgery

## 2015-01-24 ENCOUNTER — Telehealth: Payer: Self-pay | Admitting: *Deleted

## 2015-01-24 NOTE — Telephone Encounter (Signed)
Received referral from [place].  Called pt and confirmed 02/03/15 appt w/ her.  Mailed before appt letter, calendar, welcoming packet & intake form to pt.  Emailed Engineer, civil (consulting) at [employer] to make her aware.  Placed a copy of records in Dr. Geralyn Flash box and took one to HIM to scan.

## 2015-02-03 ENCOUNTER — Encounter: Payer: Self-pay | Admitting: *Deleted

## 2015-02-03 ENCOUNTER — Encounter: Payer: Self-pay | Admitting: Hematology and Oncology

## 2015-02-03 ENCOUNTER — Ambulatory Visit (HOSPITAL_BASED_OUTPATIENT_CLINIC_OR_DEPARTMENT_OTHER): Payer: Medicare Other | Admitting: Hematology and Oncology

## 2015-02-03 ENCOUNTER — Ambulatory Visit: Payer: Medicare Other

## 2015-02-03 VITALS — BP 162/64 | HR 65 | Temp 98.1°F | Resp 16 | Ht 63.0 in | Wt 186.5 lb

## 2015-02-03 DIAGNOSIS — D0512 Intraductal carcinoma in situ of left breast: Secondary | ICD-10-CM | POA: Diagnosis not present

## 2015-02-03 DIAGNOSIS — C50912 Malignant neoplasm of unspecified site of left female breast: Secondary | ICD-10-CM

## 2015-02-03 NOTE — Addendum Note (Signed)
Addended by: Renford Dills on: 02/03/2015 01:44 PM   Modules accepted: Orders, Medications

## 2015-02-03 NOTE — Progress Notes (Signed)
[place] CONSULT NOTE  Patient Care Team: Levin Erp, MD as PCP - General (Internal Medicine)  CHIEF COMPLAINTS/PURPOSE OF CONSULTATION:  Left breast DCIS  HISTORY OF PRESENTING ILLNESS:  [name] 64 y.o. female is here because of recent diagnosis of left breast DCIS. Patient had a routine screening mammogram which revealed abnormalities that led to ultrasound guided biopsy that confirmed DCIS. She did undergo breast MRI that showed postbiopsy changes. She underwent a lumpectomy on 01/04/2015 that showed DCIS grade 2 that is ER 100% PR 0%, margins were close and so she underwent reresection for the margins which continued to show close margins of less than 1 mm. She was present for the multidisciplinary tumor board and she is here today to discuss the significance of these margins.  I reviewed her records extensively and collaborated the history with the patient.  SUMMARY OF ONCOLOGIC HISTORY:   Breast cancer, left breast   12/13/2014 Breast MRI Left breast patchy diffuse multifocal enhancement with postbiopsy changes   01/04/2015 Surgery Left lumpectomy: DCIS 3.3 cm, grade 2, ER 100%, PR 0%, margins were close underwent reexcision margins close less than 1 mm    In terms of breast cancer risk profile:  She menarched at early age of 1 and went to menopause at age 45  She had one pregnancy, her first child was born at age 20  She had used IUD She was never exposed to fertility medications or hormone replacement therapy.  She has family history of Breast/GYN/GI cancer Maternal aunt age 24 breast cancer, paternal grand aunt with colon cancer. Quite a few other family members in her father's side with cancers cannot remember all of their diagnoses are ages  MEDICAL HISTORY:  Past Medical History  Diagnosis Date  . Anxiety   . GERD (gastroesophageal reflux disease)   . Chronic kidney disease     states blood found in urine in past-tests done but could not find  reason for blood  . PONV (postoperative nausea and vomiting)     with cataract surgery  . Cancer     recent left breast cancer    SURGICAL HISTORY: Past Surgical History  Procedure Laterality Date  . Cataracts l eye    . Cataract extraction    . Dental surgery    . Cholecystectomy  09/17/2011    Procedure: LAPAROSCOPIC CHOLECYSTECTOMY WITH INTRAOPERATIVE CHOLANGIOGRAM;  Surgeon: [name], MD;  Location: [place];  Service: General;  Laterality: N/A;  carm   . Eye surgery    . Breast lumpectomy with radioactive seed localization Left 01/04/2015    Procedure: LEFT PARTIAL MASTECTOMY WITH RADIOACTIVE SEED LOCALIZATION;  Surgeon: Fanny Skates, MD;  Location: [place];  Service: General;  Laterality: Left;  . Re-excision of breast lumpectomy Left 01/12/2015    Procedure: LEFT  BREAST LUMPECTOMY RE-EXCISION OF MARGINS;  Surgeon: Fanny Skates, MD;  Location: [place];  Service: General;  Laterality: Left;    SOCIAL HISTORY: History   Social History  . Marital Status: Divorced    Spouse Name: N/A  . Number of Children: 1  . Years of Education: N/A   Occupational History  . Retired    Social History Main Topics  . Smoking status: Former Smoker -- 0.50 packs/day for 15 years    Types: Cigarettes    Quit date: 11/02/2014  . Smokeless tobacco: Never Used  . Alcohol Use: 0.6 oz/week    1 Glasses of wine per week  Comment: occaasonal  . Drug Use: No  . Sexual Activity: Not on file     Comment: Did not ask   Other Topics Concern  . Not on file   Social History Narrative    FAMILY HISTORY: Family History  Problem Relation Age of Onset  . Heart disease Mother   . Hypertension Mother   . Leukemia Father   . Heart disease Father   . Breast cancer Maternal Aunt   . Colon cancer      Father's aunt  . Cancer Brother     ALLERGIES:  has No Known Allergies.  MEDICATIONS:  Current Outpatient Prescriptions  Medication Sig Dispense Refill  . aspirin 81  MG EC tablet Take 81 mg by mouth daily.      . Calcium Carbonate-Vitamin D (CALCIUM 600 + D PO) Take 2 tablets by mouth daily.      . cholecalciferol (VITAMIN D) 1000 UNITS tablet Take 1,000 Units by mouth daily.      Marland Kitchen esomeprazole (NEXIUM) 20 MG capsule Take 22.5 mg by mouth daily at 12 noon.    Marland Kitchen HYDROcodone-acetaminophen (NORCO) 5-325 MG per tablet Take 1-2 tablets by mouth every 6 (six) hours as needed for moderate pain or severe pain. 30 tablet 0  . Multiple Vitamin (MULTI-VITAMIN) tablet Take 1 tablet by mouth daily.       No current facility-administered medications for this visit.    REVIEW OF SYSTEMS:   Constitutional: Denies fevers, chills or abnormal night sweats Eyes: Denies blurriness of vision, double vision or watery eyes Ears, nose, mouth, throat, and face: Denies mucositis or sore throat Respiratory: Denies cough, dyspnea or wheezes Cardiovascular: Denies palpitation, chest discomfort or lower extremity swelling Gastrointestinal:  Denies nausea, heartburn or change in bowel habits Skin: Denies abnormal skin rashes Lymphatics: Denies new lymphadenopathy or easy bruising Neurological:Denies numbness, tingling or new weaknesses Behavioral/Psych: Mood is stable, no new changes  Breast: Mild soreness from recent surgery All other systems were reviewed with the patient and are negative.  PHYSICAL EXAMINATION: ECOG PERFORMANCE STATUS: 0 - Asymptomatic  Filed Vitals:   02/03/15 1246  BP: 162/64  Pulse: 65  Temp: 98.1 F (36.7 C)  Resp: 16   Filed Weights   02/03/15 1246  Weight: 186 lb 8 oz (84.596 kg)    GENERAL:alert, no distress and comfortable SKIN: skin color, texture, turgor are normal, no rashes or significant lesions EYES: normal, conjunctiva are pink and non-injected, sclera clear OROPHARYNX:no exudate, no erythema and lips, buccal mucosa, and tongue normal  NECK: supple, thyroid normal size, non-tender, without nodularity LYMPH:  no palpable  lymphadenopathy in the cervical, axillary or inguinal LUNGS: clear to auscultation and percussion with normal breathing effort HEART: regular rate & rhythm and no murmurs and no lower extremity edema ABDOMEN:abdomen soft, non-tender and normal bowel sounds Musculoskeletal:no cyanosis of digits and no clubbing  PSYCH: alert & oriented x 3 with fluent speech NEURO: no focal motor/sensory deficits  LABORATORY DATA:  I have reviewed the data as listed Lab Results  Component Value Date   WBC 9.7 12/31/2014   HGB 13.5 12/31/2014   HCT 39.5 12/31/2014   MCV 92.5 12/31/2014   PLT 262 12/31/2014   Lab Results  Component Value Date   NA 143 12/31/2014   K 3.9 12/31/2014   CL 106 12/31/2014   CO2 27 12/31/2014    RADIOGRAPHIC STUDIES: I have personally reviewed the radiological reports and agreed with the findings in the report. Results are summarized  as above  ASSESSMENT AND PLAN:  Breast cancer, left breast Left lumpectomy 01/04/2015: DCIS 3.3 cm, grade 2, ER 100%, PR 0%, margins were close underwent reexcision margins negative  Pathology and radiology review: I discussed the pathology report in great detail explained the difference between DCIS and invasive breast cancer. We discussed the significance of being grade 2 as well as the ER and PR receptors and their implications for treatment.  Margins: Patient had reresection for close margins and was found to have still close margins and the resection. We discussed the case in the tumor board and the consensus was that she could undergo radiation with the boost to make up for the close margins. Patient has an appointment to see radiation oncology.  Recommendation: 1. Adjuvant radiation therapy 2. Followed by antiestrogen therapy with tamoxifen 20 mg daily for 5 years  Tamoxifen counseling: We discussed the risks and benefits of tamoxifen. These include but not limited to insomnia, hot flashes, mood changes, vaginal dryness, and weight  gain. Although rare, serious side effects including endometrial cancer, risk of blood clots were also discussed. We strongly believe that the benefits far outweigh the risks. Patient understands these risks and consented to starting treatment. Planned treatment duration is 5 years.  Return to clinic after radiation therapy is complete to start adjuvant tamoxifen.  All questions were answered. The patient knows to call the clinic with any problems, questions or concerns.    Rulon Eisenmenger, MD 1:15 PM

## 2015-02-03 NOTE — Progress Notes (Unsigned)
Met with pt during new pt appt with Dr. Lindi Adie. Gave navigation resources and contact information. Encourage pt to call with questions or concerns. Received verbal understanding.

## 2015-02-03 NOTE — Assessment & Plan Note (Addendum)
Left lumpectomy 01/04/2015: DCIS 3.3 cm, grade 2, ER 100%, PR 0%, margins were close underwent reexcision margins negative  Pathology and radiology review: I discussed the pathology report in great detail explained the difference between DCIS and invasive breast cancer. We discussed the significance of being grade 2 as well as the ER and PR receptors and their implications for treatment.  Margins: Patient had reresection for close margins and was found to have still close margins and the resection. We discussed the case in the tumor board and the consensus was that she could undergo radiation with the boost to make up for the close margins. Patient has an appointment to see radiation oncology.  Recommendation: 1. Adjuvant radiation therapy 2. Followed by antiestrogen therapy with tamoxifen 20 mg daily for 5 years  Tamoxifen counseling: We discussed the risks and benefits of tamoxifen. These include but not limited to insomnia, hot flashes, mood changes, vaginal dryness, and weight gain. Although rare, serious side effects including endometrial cancer, risk of blood clots were also discussed. We strongly believe that the benefits far outweigh the risks. Patient understands these risks and consented to starting treatment. Planned treatment duration is 5 years.  Return to clinic after radiation therapy is complete to start adjuvant tamoxifen.

## 2015-02-03 NOTE — Progress Notes (Signed)
Note created by Dr. Gudena during office visit. Copy to patient, original to scan. 

## 2015-02-03 NOTE — Progress Notes (Signed)
Checked in new pt with no financial concerns. °

## 2015-02-08 ENCOUNTER — Encounter: Payer: Self-pay | Admitting: Radiation Oncology

## 2015-02-08 NOTE — Progress Notes (Signed)
Location of Breast Cancer: Left Breast, Upper Outer Quadrant  Histology per Pathology Report:  01/12/15 Diagnosis 1. Breast, excision, re-excision left posterior margin - BENIGN FIBROFATTY SOFT TISSUE WITH FAT NECROSIS AND FIBROSIS. - BENIGN MUSCLE PRESENT. - NO ATYPIA OR TUMOR SEEN. 2. Breast, excision, re-excision left inferior margin - INTERMEDIATE GRADE DUCTAL CARCINOMA IN SITU. - DUCTAL CARCINOMA IN SITU IS EXTREMELY CLOSE (LESS THAN 0.1 CM) FROM INKED MARGIN. - SEE COMMENT.  01/04/15 Diagnosis Breast, lumpectomy, left - DUCTAL CARCINOMA IN SITU, SEE COMMENT. - IN SITU CARCINOMA IS 1 MM FROM NEAREST POSTERIOR MARGIN. - IN SITU CARCINOMA IS 0.5 MM FROM NEAREST INFERIOR MARGIN. - PREVIOUS BIOPSY SITE. - SEE TUMOR SYNOPTIC TEMPLATE BELOW.  09/08/08 Diagnosis Breast, left, needle core biopsy, upper outer quadrant - DUCTAL CARCINOMA IN SITU WITH CALCIFICATIONS. - SEE COMMENT. 1 of 2  Receptor Status: ER(100%), PR (0), Her2-neu ()  Ms. [name] had a routine screening mammogram which revealed abnormalities that led to ultrasound guided biopsy that confirmed DCIS. She did undergo breast MRI that showed postbiopsy changes. She underwent a lumpectomy on 01/04/2015 that showed DCIS grade 2 that is ER 100% PR 0%, margins were close and so she underwent reresection for the margins which continued to show close margins of less than 1 mm.   Past/Anticipated interventions by surgeon, if any: Dr. Fanny Skates: Left Breast Lumpectomy and Re-excision    Past/Anticipated interventions by medical oncology, if any: Dr. Nicholas Lose - Adjuvant radiation therapy followed by antiestrogen therapy with tamoxifen 20 mg daily for 5 years   Lymphedema issues, if any: None  Pain issues, if any:  Level 6 pain in her left breast since Rexcision on 5//11/16  SAFETY ISSUES:  Prior radiation? No  Pacemaker/ICD? No  Possible current pregnancy? No  Is the patient on methotrexate? No    Current Complaints / other details:    She menarched at early age of 41 and went to menopause at age 3  She had one pregnancy, her first child was born at age 28 BC x 2-3 years  She had used IUD over 10 years She was never exposed to fertility medications or hormone replacement therapy    [name], Ana Curb, RN 02/08/2015,7:33 PM

## 2015-02-09 ENCOUNTER — Ambulatory Visit
Admission: RE | Admit: 2015-02-09 | Discharge: 2015-02-09 | Disposition: A | Discharge status: Home / Self Care | Payer: Medicare Other | Source: Ambulatory Visit | Attending: Radiation Oncology | Admitting: Radiation Oncology

## 2015-02-09 ENCOUNTER — Encounter: Payer: Self-pay | Admitting: Radiation Oncology

## 2015-02-09 VITALS — BP 169/80 | HR 70 | Temp 97.8°F | Ht 63.0 in | Wt 183.7 lb

## 2015-02-09 DIAGNOSIS — Z79899 Other long term (current) drug therapy: Secondary | ICD-10-CM | POA: Diagnosis not present

## 2015-02-09 DIAGNOSIS — Z87891 Personal history of nicotine dependence: Secondary | ICD-10-CM | POA: Diagnosis not present

## 2015-02-09 DIAGNOSIS — D0512 Intraductal carcinoma in situ of left breast: Secondary | ICD-10-CM | POA: Insufficient documentation

## 2015-02-09 DIAGNOSIS — Z170 Estrogen receptor positive status [ER+]: Secondary | ICD-10-CM | POA: Diagnosis not present

## 2015-02-09 DIAGNOSIS — N189 Chronic kidney disease, unspecified: Secondary | ICD-10-CM | POA: Diagnosis not present

## 2015-02-09 DIAGNOSIS — Z7982 Long term (current) use of aspirin: Secondary | ICD-10-CM | POA: Insufficient documentation

## 2015-02-09 DIAGNOSIS — Z803 Family history of malignant neoplasm of breast: Secondary | ICD-10-CM | POA: Diagnosis not present

## 2015-02-09 DIAGNOSIS — K219 Gastro-esophageal reflux disease without esophagitis: Secondary | ICD-10-CM | POA: Insufficient documentation

## 2015-02-09 DIAGNOSIS — C50412 Malignant neoplasm of upper-outer quadrant of left female breast: Secondary | ICD-10-CM | POA: Diagnosis not present

## 2015-02-09 NOTE — Addendum Note (Signed)
Encounter addended by: Benn Moulder, RN on: 02/09/2015  6:32 PM<BR>     Documentation filed: Arn Medal VN, BPA Follow-up Actions, Charges VN

## 2015-02-09 NOTE — Progress Notes (Signed)
[facility] NEW PATIENT EVALUATION  Name: [name] MRN: [id number]  Date:   02/09/2015           DOB: 02-06-1951  Status: outpatient   CC: GREEN, Keenan Bachelor, MD  Fanny Skates, MD , Dr. Nicholas Lose   REFERRING PHYSICIAN: Fanny Skates, MD   DIAGNOSIS: Stage 0 (Tis N0 M0) DCIS of the left breast     HISTORY OF PRESENT ILLNESS:  [name] is a 64 y.o. female who is seen today for the courtesy of Dr. Dalbert Batman for consideration of radiation therapy following conservative surgery in the management of her DCIS of the left breast.  At the time of a screening mammogram on 12/01/2014 she was felt to have new calcifications within the left breast.  Additional views at the [facility] showed suspicious calcifications within the upper-outer quadrant of the breast which were biopsied on 12/08/2014.  She was felt  to have "high-grade" DCIS with calcifications.  This was strongly ER positive at 100% and PR negative at 0%.  She was seen by Dr. Dalbert Batman who performed a left partial mastectomy on 01/04/2015.  The DCIS measured 3.3 cm.  The closest margin was 0.5 mm from the inferior tumor bed and 1 mm from the posterior margin.  The DCIS was felt to be grade 2/3.  In view of the close margins she underwent reexcision on 01/12/2015.  Reexcision of the posterior margin was benign, and reexcision of the inferior margin revealed residual DCIS less than 0.1 cm from the inked margin.  The residual foci measured up to 0.2 cm in greatest dimensions and involves 0.9 cm worth of breast parenchyma.  She was seen by Dr. Lindi Adie who plans on giving her antiestrogen therapy following completion of radiation therapy.  She is without complaints today.  PREVIOUS RADIATION THERAPY: No   PAST MEDICAL HISTORY:  has a past medical history of Anxiety; GERD (gastroesophageal reflux disease); Chronic kidney disease; PONV (postoperative nausea and vomiting); and Ductal carcinoma in situ  (DCIS) of left breast (  12/08/14).     PAST SURGICAL HISTORY:  Past Surgical History  Procedure Laterality Date  . Cataracts l eye    . Cataract extraction    . Dental surgery    . Cholecystectomy  09/17/2011    Procedure: LAPAROSCOPIC CHOLECYSTECTOMY WITH INTRAOPERATIVE CHOLANGIOGRAM;  Surgeon: [name], MD;  Location: [place];  Service: General;  Laterality: N/A;  carm   . Eye surgery    . Breast lumpectomy with radioactive seed localization Left 01/04/2015    Procedure: LEFT PARTIAL MASTECTOMY WITH RADIOACTIVE SEED LOCALIZATION;  Surgeon: Fanny Skates, MD;  Location: [place];  Service: General;  Laterality: Left;  . Re-excision of breast lumpectomy Left 01/12/2015    Procedure: LEFT  BREAST LUMPECTOMY RE-EXCISION OF MARGINS;  Surgeon: Fanny Skates, MD;  Location: [place];  Service: General;  Laterality: Left;  . Left breast biopsy  12/08/14     FAMILY HISTORY: family history includes Breast cancer in her maternal aunt; Cancer in her brother; Colon cancer in an other family member; Heart disease in her father and mother; Hypertension in her mother; Leukemia in her father. Her father died from leukemia at 19.  Her mother is alive at age 84 with a pacemaker.  She lives with the patient.   SOCIAL HISTORY:  reports that she quit smoking about 3 months ago. Her smoking use included Cigarettes. She has a 7.5 pack-year smoking history.  She has never used smokeless tobacco. She reports that she drinks about 0.6 oz of alcohol per week. She reports that she does not use illicit drugs.  Divorced, one child.  Retired from Therapist, art.   ALLERGIES: Review of patient's allergies indicates no known allergies.   MEDICATIONS:  Current Outpatient Prescriptions  Medication Sig Dispense Refill  . aspirin 81 MG EC tablet Take 81 mg by mouth daily.      Marland Kitchen BIOTIN 5000 PO Take by mouth daily.    . Calcium Carbonate-Vitamin D (CALCIUM 600 + D PO) Take 2 tablets by mouth daily.       . Cholecalciferol (VITAMIN D3) 2000 UNITS TABS Take by mouth daily.    Marland Kitchen esomeprazole (NEXIUM) 20 MG capsule Take 22.5 mg by mouth daily at 12 noon.    . Multiple Vitamins-Minerals (CENTRUM SILVER PO) Take by mouth daily.    . vitamin C (ASCORBIC ACID) 500 MG tablet Take 500 mg by mouth daily.    . vitamin E 400 UNIT capsule Take 400 Units by mouth daily.     No current facility-administered medications for this encounter.     REVIEW OF SYSTEMS:  Pertinent items are noted in HPI.    PHYSICAL EXAM:  height is 5\' 3"  (1.6 m) and weight is 183 lb 11.2 oz (83.326 kg). Her temperature is 97.8 F (36.6 C). Her blood pressure is 169/80 and her pulse is 70.   Alert and oriented 64 year old white female appearing her stated age.  Head and neck examination: Grossly unremarkable.  Nodes: Without palpable cervical, supraclavicular, or axillary lymphadenopathy.  Chest: Lungs clear.  Breasts: There is a partial mastectomy wound along the upper-outer quadrant of the left breast extending from one to 3:00.  The wound is healing well.  There is a modest volume defect.  No masses are appreciated.  Right breast without masses or lesions.  Extremities: Without edema.   LABORATORY DATA:  Lab Results  Component Value Date   WBC 9.7 12/31/2014   HGB 13.5 12/31/2014   HCT 39.5 12/31/2014   MCV 92.5 12/31/2014   PLT 262 12/31/2014   Lab Results  Component Value Date   NA 143 12/31/2014   K 3.9 12/31/2014   CL 106 12/31/2014   CO2 27 12/31/2014   Lab Results  Component Value Date   ALT 50* 12/31/2014   AST 45* 12/31/2014   ALKPHOS 89 12/31/2014   BILITOT 0.8 12/31/2014      IMPRESSION: Stage 0 (Tis N0 M0) DCIS of the left breast.  In general terms, we discussed local management which includes mastectomy with or without reconstruction or breast conservation surgery followed by radiation therapy.  She desires breast conservation.  In view of her close margin and presentation with calcifications, I  think it would be worthwhile to obtain a pre-radiation therapy mammogram to confirm removal of all suspicious microcalcifications.  This be scheduled for next week.  We discussed the potential acute and late toxicities of radiation therapy.  We also discussed deep inspiration breath-hold technology to avoid cardiac irradiation.  We also discussed hypofractionated treatment versus standard fractionation, and she woould like to have hypofractionated treatment.  She will need a boost because of her close margin.  We also discussed treatment in [facility] versus treatment here and she prefers to visit [facility] for her treatment.  Consent is signed today.   PLAN: We will get her set up for mammography at the [facility] next week and then have her  return the week of June 20 for CT simulation.   I spent 45 minutes face to face with the patient and more than 50% of that time was spent in counseling and/or coordination of care.

## 2015-02-10 ENCOUNTER — Telehealth: Payer: Self-pay | Admitting: Radiation Oncology

## 2015-02-10 NOTE — Telephone Encounter (Signed)
LVM for patient to confirm [facility]14, Tuesday, at 9:00 and to bring insurance card and ID.

## 2015-02-14 ENCOUNTER — Telehealth: Payer: Self-pay | Admitting: *Deleted

## 2015-02-14 NOTE — Telephone Encounter (Signed)
Called patient to inform of mammogram and sim , lvm for a return call

## 2015-02-15 ENCOUNTER — Other Ambulatory Visit: Payer: Self-pay | Admitting: Radiation Oncology

## 2015-02-15 ENCOUNTER — Telehealth: Payer: Self-pay | Admitting: *Deleted

## 2015-02-15 ENCOUNTER — Ambulatory Visit
Admission: RE | Admit: 2015-02-15 | Discharge: 2015-02-15 | Disposition: A | Discharge status: Home / Self Care | Payer: Medicare Other | Source: Ambulatory Visit | Attending: Radiation Oncology | Admitting: Radiation Oncology

## 2015-02-15 DIAGNOSIS — R921 Mammographic calcification found on diagnostic imaging of breast: Secondary | ICD-10-CM | POA: Diagnosis not present

## 2015-02-15 DIAGNOSIS — C50412 Malignant neoplasm of upper-outer quadrant of left female breast: Secondary | ICD-10-CM

## 2015-02-15 DIAGNOSIS — Z853 Personal history of malignant neoplasm of breast: Secondary | ICD-10-CM | POA: Diagnosis not present

## 2015-02-15 NOTE — Telephone Encounter (Signed)
Called patient to give info., lvm for a return call. 

## 2015-02-17 ENCOUNTER — Encounter: Payer: Self-pay | Admitting: *Deleted

## 2015-02-17 NOTE — Progress Notes (Signed)
[place] Psychosocial Distress Screening Clinical Social Work  Clinical Social Work was referred by distress screening protocol.  The patient scored a 7 on the Psychosocial Distress Thermometer which indicates severe distress. Clinical Social Worker phoned pt to assess for distress and other psychosocial needs. Pt shared she was waiting to get more information from her surgeon before treatment. CSW discussed with pt common emotions and ways to cope. Pt seemed to be experiencing common emotions while trying to establish a treatment plan and denied extreme depression currently. CSW discussed [employer], support group and other ways to cope. Pt not interested currently. Pt agrees to reach out to [facility] team as needed.   ONCBCN DISTRESS SCREENING 02/09/2015  Screening Type Initial Screening  Distress experienced in past week (1-10) 7  Emotional problem type Depression;Adjusting to illness  Spiritual/Religous concerns type Loss of sense of purpose  Physical Problem type Pain;Constipation/diarrhea;Tingling hands/feet;Swollen arms/legs  Physician notified of physical symptoms Yes  Referral to clinical social work Yes  Other Depression is the most distressing item    Clinical Social Worker follow up needed: No.  If yes, follow up plan: [name], [place]  [facility] Phone: [phone] Fax: [phone]

## 2015-02-21 ENCOUNTER — Ambulatory Visit
Admission: RE | Admit: 2015-02-21 | Discharge: 2015-02-21 | Disposition: A | Discharge status: Home / Self Care | Payer: Medicare Other | Source: Ambulatory Visit | Attending: Radiation Oncology | Admitting: Radiation Oncology

## 2015-02-21 ENCOUNTER — Ambulatory Visit: Payer: Medicare Other | Admitting: Radiation Oncology

## 2015-02-21 ENCOUNTER — Other Ambulatory Visit: Payer: Self-pay | Admitting: *Deleted

## 2015-02-22 ENCOUNTER — Other Ambulatory Visit: Payer: Self-pay | Admitting: General Surgery

## 2015-02-22 DIAGNOSIS — R921 Mammographic calcification found on diagnostic imaging of breast: Secondary | ICD-10-CM

## 2015-02-28 ENCOUNTER — Other Ambulatory Visit: Payer: Self-pay

## 2015-03-01 ENCOUNTER — Ambulatory Visit
Admission: RE | Admit: 2015-03-01 | Discharge: 2015-03-01 | Disposition: A | Discharge status: Home / Self Care | Payer: Medicare Other | Source: Ambulatory Visit | Attending: General Surgery | Admitting: General Surgery

## 2015-03-01 ENCOUNTER — Other Ambulatory Visit (HOSPITAL_COMMUNITY): Payer: Self-pay | Admitting: Radiology

## 2015-03-01 DIAGNOSIS — Z853 Personal history of malignant neoplasm of breast: Secondary | ICD-10-CM | POA: Diagnosis not present

## 2015-03-01 DIAGNOSIS — D0512 Intraductal carcinoma in situ of left breast: Secondary | ICD-10-CM | POA: Diagnosis not present

## 2015-03-01 DIAGNOSIS — R921 Mammographic calcification found on diagnostic imaging of breast: Secondary | ICD-10-CM | POA: Diagnosis not present

## 2015-03-11 DIAGNOSIS — C50912 Malignant neoplasm of unspecified site of left female breast: Secondary | ICD-10-CM | POA: Diagnosis not present

## 2015-03-16 ENCOUNTER — Other Ambulatory Visit: Payer: Self-pay | Admitting: General Surgery

## 2015-03-16 DIAGNOSIS — C50912 Malignant neoplasm of unspecified site of left female breast: Secondary | ICD-10-CM

## 2015-04-01 ENCOUNTER — Encounter (HOSPITAL_COMMUNITY)
Admission: RE | Admit: 2015-04-01 | Discharge: 2015-04-01 | Disposition: A | Discharge status: Home / Self Care | Payer: Medicare Other | Source: Ambulatory Visit | Attending: General Surgery | Admitting: General Surgery

## 2015-04-01 ENCOUNTER — Encounter (HOSPITAL_COMMUNITY): Payer: Self-pay

## 2015-04-01 ENCOUNTER — Other Ambulatory Visit (HOSPITAL_COMMUNITY): Payer: Self-pay | Admitting: Plastic Surgery

## 2015-04-01 DIAGNOSIS — C50912 Malignant neoplasm of unspecified site of left female breast: Secondary | ICD-10-CM | POA: Insufficient documentation

## 2015-04-01 DIAGNOSIS — Z01812 Encounter for preprocedural laboratory examination: Secondary | ICD-10-CM | POA: Diagnosis not present

## 2015-04-01 HISTORY — DX: Cardiac murmur, unspecified: R01.1

## 2015-04-01 HISTORY — DX: Major depressive disorder, single episode, unspecified: F32.9

## 2015-04-01 LAB — COMPREHENSIVE METABOLIC PANEL
ALK PHOS: 92 U/L (ref 38–126)
ALT: 24 U/L (ref 14–54)
AST: 25 U/L (ref 15–41)
Albumin: 3.8 g/dL (ref 3.5–5.0)
Anion gap: 8 (ref 5–15)
BILIRUBIN TOTAL: 1.3 mg/dL — AB (ref 0.3–1.2)
BUN: 9 mg/dL (ref 6–20)
CHLORIDE: 109 mmol/L (ref 101–111)
CO2: 24 mmol/L (ref 22–32)
Calcium: 9.4 mg/dL (ref 8.9–10.3)
Creatinine, Ser: 0.75 mg/dL (ref 0.44–1.00)
GFR calc Af Amer: >60 mL/min (ref >60)
GFR calc non Af Amer: >60 mL/min (ref >60)
Glucose, Bld: 94 mg/dL (ref 65–99)
Potassium: 3.9 mmol/L (ref 3.5–5.1)
Sodium: 141 mmol/L (ref 135–145)
Total Protein: 6.5 g/dL (ref 6.5–8.1)

## 2015-04-01 LAB — CBC WITH DIFFERENTIAL/PLATELET
Basophils Absolute: 0.1 10*3/uL (ref 0.0–0.1)
Basophils Relative: 1 % (ref 0–1)
EOS PCT: 6 % — AB (ref 0–5)
Eosinophils Absolute: 0.6 10*3/uL (ref 0.0–0.7)
HCT: 40.1 % (ref 36.0–46.0)
Hemoglobin: 14 g/dL (ref 12.0–15.0)
LYMPHS ABS: 4.1 10*3/uL — AB (ref 0.7–4.0)
Lymphocytes Relative: 40 % (ref 12–46)
MCH: 31.9 pg (ref 26.0–34.0)
MCHC: 34.9 g/dL (ref 30.0–36.0)
MCV: 91.3 fL (ref 78.0–100.0)
MONO ABS: 0.7 10*3/uL (ref 0.1–1.0)
Monocytes Relative: 7 % (ref 3–12)
NEUTROS ABS: 5 10*3/uL (ref 1.7–7.7)
NEUTROS PCT: 48 % (ref 43–77)
PLATELETS: 280 10*3/uL (ref 150–400)
RBC: 4.39 MIL/uL (ref 3.87–5.11)
RDW: 12.9 % (ref 11.5–15.5)
WBC: 10.5 10*3/uL (ref 4.0–10.5)

## 2015-04-01 NOTE — Pre-Procedure Instructions (Addendum)
[name]  04/01/2015      [facility] - [place] ([place]), [place] [address]  ([place]) [place] [address] Phone: [phone] Fax: [phone]    Your procedure is scheduled on 04/07/15.  Report to [facility] short stay admitting at 730 A.M.  Call this number if you have problems the morning of surgery:  [phone]   Remember:  Do not eat food or drink liquids after midnight.  Take these medicines the morning of surgery with A SIP OF WATER nexium    STOP all herbel meds, nsaids (aleve,naproxen,advil,ibuprofen)starting today including aspirin, biotin, calcium, vit D, vit C, vit E, multi vit   Do not wear jewelry, make-up or nail polish.  Do not wear lotions, powders, or perfumes.  You may wear deodorant.  Do not shave 48 hours prior to surgery.  Men may shave face and neck.  Do not bring valuables to the hospital.  [facility] is not responsible for any belongings or valuables.  Contacts, dentures or bridgework may not be worn into surgery.  Leave your suitcase in the car.  After surgery it may be brought to your room.  For patients admitted to the hospital, discharge time will be determined by your treatment team.  Patients discharged the day of surgery will not be allowed to drive home.   Name and phone number of your driver:    Special instructions:   Special Instructions: [facility] - Preparing for Surgery  Before surgery, you can play an important role.  Because skin is not sterile, your skin needs to be as free of germs as possible.  You can reduce the number of germs on you skin by washing with CHG (chlorahexidine gluconate) soap before surgery.  CHG is an antiseptic cleaner which kills germs and bonds with the skin to continue killing germs even after washing.  Please DO NOT use if you have an allergy to CHG or antibacterial soaps.  If your skin becomes reddened/irritated stop using the CHG and inform your nurse when you  arrive at Short Stay.  Do not shave (including legs and underarms) for at least 48 hours prior to the first CHG shower.  You may shave your face.  Please follow these instructions carefully:   1.  Shower with CHG Soap the night before surgery and the morning of Surgery.  2.  If you choose to wash your hair, wash your hair first as usual with your normal shampoo.  3.  After you shampoo, rinse your hair and body thoroughly to remove the Shampoo.  4.  Use CHG as you would any other liquid soap.  You can apply chg directly  to the skin and wash gently with scrungie or a clean washcloth.  5.  Apply the CHG Soap to your body ONLY FROM THE NECK DOWN.  Do not use on open wounds or open sores.  Avoid contact with your eyes ears, mouth and genitals (private parts).  Wash genitals (private parts)       with your normal soap.  6.  Wash thoroughly, paying special attention to the area where your surgery will be performed.  7.  Thoroughly rinse your body with warm water from the neck down.  8.  DO NOT shower/wash with your normal soap after using and rinsing off the CHG Soap.  9.  Pat yourself dry with a clean towel.            10.  Wear clean pajamas.            11.  Place clean sheets on your bed the night of your first shower and do not sleep with pets.  Day of Surgery  Do not apply any lotions/deodorants the morning of surgery.  Please wear clean clothes to the hospital/surgery center.  Please read over the following fact sheets that you were given. Pain Booklet, Coughing and Deep Breathing and Surgical Site Infection Prevention

## 2015-04-05 NOTE — H&P (Signed)
[name]  Location: [facility] Surgery Patient #: [id number] DOB: [birth year] Divorced / Language: English / Race: White Female      History of Present Illness   The patient is a 64 year old female who presents with breast cancer. This is a 64 year old female who returns for further discussion and final decision making regarding her intermediate grade DCIS left breast, which is ER positive and PR negative. On Jan 04, 2015 showed left partial mastectomy and she had very close posterior and very close inferior margins. On May 11 she underwent a second left partial mastectomy with reexcision of posterior and reexcision of inferior margin. The inferior margin was again very close to the posterior margin looked fine. Subsequent mammogram showed residual calcifications in [place] anteriorly. Dr. Glennon Mac performed another biopsy fairly close to the lumpectomy cavity and this again showed DCIS. She has seen Crissie Reese. She was interested in lumpectomy. We have described a generous radial ellipse, almost a quadrantectomy which may require subsequent revision after radiation therapy. We have described nipple sparing mastectomy which we think is reasonable as well as standard mastectomy. After thinking through all these options she wants to proceed with left nipple sparing mastectomy with immediate reconstruction. She knows that she may lose the nipple because of circulation problems or if we find cancer on the biopsy. She knows that her nipple will be insensate. She does not want to have a fourth operation. She is happy about the probability of not having radiation therapy and she likes the idea of having immediate symmetry without subsequent operations. She understands that she might need second surgery for any kind of complication we discussed and is comfortable with that. I have reviewed her histology and all of her imaging findings today with Dr. Crissie Reese and Dr. Margarette Canada. She'll be scheduled for left nipple sparing mastectomy, left sentinel lymph node biopsy. We will coordinate with Dr. Harlow Mares. I discussed the indications, details, techniques, and numerous risk of the surgery with her. She is aware of the risk of bleeding, infection, nipple necrosis, seroma formation, scar contracture, reoperation for complications, low risk for swelling or numbness. She understands all these issues. All of her questions were answered. She agrees with this plan.   Allergies  No Known Drug Allergies04/21/2016  Medication History Biotin (5000MCG Tablet, Oral daily) Active. Vitamin D3 (2000UNIT Tablet, Oral daily) Active. NexIUM (20MG Capsule DR, Oral prn) Active. Vitamin E (400UNIT Capsule, Oral daily) Active. Aspirin (81MG Tablet, Oral daily) Active. Multi Vitamin Daily (Oral) Active. (Centrum Silver) Multivitamins (Oral daily) Active. Omeprazole (40MG Capsule DR, Oral daily) Active. Medications Reconciled  Vitals  Weight: 186 lb Height: 63in Body Surface Area: 1.94 m Body Mass Index: 32.95 kg/m Temp.: 98.74F(Temporal)  Pulse: 79 (Regular)  BP: 130/80 (Sitting, Left Arm, Standard)    Physical Exam  General Note: Pleasant. Cooperative. Asks good questions. No distress.   Chest and Lung Exam Note: Clear to auscultation bilaterally   Breast Note: Breast are medium size. 34C bra size. Left breast minimally smaller than right. Well-healed scar upper outer quadrant. No infection drainage or significant mass. No axillary adenopathy. No hematoma.   Cardiovascular Note: Regular rate and rhythm. No murmur. No ectopy.     Assessment & Plan   PRIMARY CANCER OF UPPER OUTER QUADRANT OF LEFT FEMALE BREAST (174.4  C50.412) Impression: Ductal carcinoma in situ. Intermediate grade. ER positive. PR negative. Residual disease following 2 lumpectomies and one subsequent core biopsy..   Schedule for Surgery You have  met with  Dr. Harlow Mares and discussed options for quadrantectomy and mastectomy. We have talked about the pros and cons of lumpectomy versus standard mastectomy versus nipple sparing mastectomy. With this option almost certainly avoid radiation therapy, and hopefully you will get good symmetry from the start. After consideration of all of these options you have requested that we proceed with a left nipple sparing mastectomy which includes an immediate reconstruction. I think that is a reasonable option. We have talked about the risks of the surgery including having to remove the nipple later because of circulation problems or cancer in the nipple. Because we are doing a mastectomy plan to do a sentinel lymph node biopsy which is just taking one or 2 lymph nodes under the arm. This may or may not require a second incision you will be scheduled for a left nipple sparing mastectomy, left axillary sentinel node biopsy Please read the written information that we have given you.  Pt Education - Breast Removal (Mastectomy): breast  CHRONIC GERD (530.81  K21.9) FORMER SMOKER (V15.82  Z87.891) HISTORY OF CHOLECYSTECTOMY (V45.79  Z90.49) ANXIETY, MILD (300.00  F41.9) HISTORY OF CATARACT (V12.49  Z86.69)   Edsel Petrin. Dalbert Batman, M.D., [facility] General and Minimally invasive Surgery Breast and Colorectal Surgery Office:   [phone] Pager:   [phone]

## 2015-04-07 ENCOUNTER — Encounter (HOSPITAL_COMMUNITY)
Admission: RE | Disposition: A | Discharge status: Home / Self Care | Payer: Self-pay | Source: Ambulatory Visit | Attending: General Surgery

## 2015-04-07 ENCOUNTER — Ambulatory Visit (HOSPITAL_COMMUNITY): Payer: Medicare Other | Admitting: Anesthesiology

## 2015-04-07 ENCOUNTER — Encounter (HOSPITAL_COMMUNITY)
Admission: RE | Admit: 2015-04-07 | Discharge: 2015-04-07 | Disposition: A | Discharge status: Home / Self Care | Payer: Medicare Other | Source: Ambulatory Visit | Attending: General Surgery | Admitting: General Surgery

## 2015-04-07 ENCOUNTER — Encounter (HOSPITAL_COMMUNITY): Payer: Self-pay | Admitting: *Deleted

## 2015-04-07 ENCOUNTER — Ambulatory Visit (HOSPITAL_COMMUNITY)
Admission: RE | Admit: 2015-04-07 | Discharge: 2015-04-08 | Disposition: A | Discharge status: Home / Self Care | Payer: Medicare Other | Source: Ambulatory Visit | Attending: General Surgery | Admitting: General Surgery

## 2015-04-07 DIAGNOSIS — C50912 Malignant neoplasm of unspecified site of left female breast: Secondary | ICD-10-CM | POA: Diagnosis not present

## 2015-04-07 DIAGNOSIS — G8918 Other acute postprocedural pain: Secondary | ICD-10-CM | POA: Diagnosis not present

## 2015-04-07 DIAGNOSIS — K219 Gastro-esophageal reflux disease without esophagitis: Secondary | ICD-10-CM | POA: Insufficient documentation

## 2015-04-07 DIAGNOSIS — Z170 Estrogen receptor positive status [ER+]: Secondary | ICD-10-CM | POA: Diagnosis not present

## 2015-04-07 DIAGNOSIS — D0512 Intraductal carcinoma in situ of left breast: Secondary | ICD-10-CM | POA: Diagnosis not present

## 2015-04-07 DIAGNOSIS — F419 Anxiety disorder, unspecified: Secondary | ICD-10-CM | POA: Diagnosis not present

## 2015-04-07 DIAGNOSIS — Z7982 Long term (current) use of aspirin: Secondary | ICD-10-CM | POA: Diagnosis not present

## 2015-04-07 DIAGNOSIS — Z87891 Personal history of nicotine dependence: Secondary | ICD-10-CM | POA: Diagnosis not present

## 2015-04-07 DIAGNOSIS — Z79899 Other long term (current) drug therapy: Secondary | ICD-10-CM | POA: Diagnosis not present

## 2015-04-07 HISTORY — PX: NIPPLE SPARING MASTECTOMY/SENTINAL LYMPH NODE BIOPSY/RECONSTRUCTION/PLACEMENT OF TISSUE EXPANDER: SHX6484

## 2015-04-07 HISTORY — PX: BREAST RECONSTRUCTION WITH PLACEMENT OF TISSUE EXPANDER AND FLEX HD (ACELLULAR HYDRATED DERMIS): SHX6295

## 2015-04-07 SURGERY — NIPPLE SPARING MASTECTOMY WITH SENTINAL LYMPH NODE BIOPSY AND  RECONSTRUCTION WITH PLACEMENT OF TISSUE EXPANDER
Anesthesia: Regional | Laterality: Left

## 2015-04-07 MED ORDER — DEXAMETHASONE SODIUM PHOSPHATE 10 MG/ML IJ SOLN
INTRAMUSCULAR | Status: DC | PRN
  Administered 2015-04-07: 10 mg via INTRAVENOUS

## 2015-04-07 MED ORDER — LACTATED RINGERS IV SOLN
INTRAVENOUS | Status: DC
  Administered 2015-04-07 (×2): via INTRAVENOUS

## 2015-04-07 MED ORDER — ENOXAPARIN SODIUM 40 MG/0.4ML ~~LOC~~ SOLN
40.0000 mg | SUBCUTANEOUS | Status: DC
  Administered 2015-04-08: 40 mg via SUBCUTANEOUS
  Filled 2015-04-07: qty 0.4

## 2015-04-07 MED ORDER — GENTAMICIN SULFATE 40 MG/ML IJ SOLN
INTRAMUSCULAR | Status: DC
  Filled 2015-04-07: qty 1

## 2015-04-07 MED ORDER — OXYCODONE HCL 5 MG PO TABS: 5.0000 mg | ORAL_TABLET | Freq: Once | ORAL | Status: DC | PRN

## 2015-04-07 MED ORDER — FENTANYL CITRATE (PF) 250 MCG/5ML IJ SOLN
INTRAMUSCULAR | Status: AC
  Filled 2015-04-07: qty 5

## 2015-04-07 MED ORDER — BUPIVACAINE-EPINEPHRINE (PF) 0.5% -1:200000 IJ SOLN
INTRAMUSCULAR | Status: DC | PRN
  Administered 2015-04-07: 25 mL

## 2015-04-07 MED ORDER — METHOCARBAMOL 500 MG PO TABS
500.0000 mg | ORAL_TABLET | Freq: Four times a day (QID) | ORAL | Status: DC
  Administered 2015-04-07 (×2): 500 mg via ORAL
  Filled 2015-04-07 (×3): qty 1

## 2015-04-07 MED ORDER — HYDROMORPHONE HCL 2 MG PO TABS
ORAL_TABLET | ORAL | Status: AC
  Filled 2015-04-07: qty 2

## 2015-04-07 MED ORDER — CEFAZOLIN SODIUM-DEXTROSE 2-3 GM-% IV SOLR
2.0000 g | INTRAVENOUS | Status: AC
  Administered 2015-04-07 (×2): 2 g via INTRAVENOUS
  Filled 2015-04-07: qty 50

## 2015-04-07 MED ORDER — SODIUM CHLORIDE 0.9 % IV SOLN
INTRAVENOUS | Status: DC | PRN
  Administered 2015-04-07: 1000 mL

## 2015-04-07 MED ORDER — MIDAZOLAM HCL 2 MG/2ML IJ SOLN
2.0000 mg | Freq: Once | INTRAMUSCULAR | Status: AC
  Administered 2015-04-07: 2 mg via INTRAVENOUS
  Filled 2015-04-07: qty 2

## 2015-04-07 MED ORDER — DEXAMETHASONE SODIUM PHOSPHATE 10 MG/ML IJ SOLN
INTRAMUSCULAR | Status: AC
  Filled 2015-04-07: qty 1

## 2015-04-07 MED ORDER — METHYLENE BLUE 1 % INJ SOLN
INTRAMUSCULAR | Status: AC
  Filled 2015-04-07: qty 10

## 2015-04-07 MED ORDER — CEFAZOLIN SODIUM 1-5 GM-% IV SOLN
1.0000 g | Freq: Four times a day (QID) | INTRAVENOUS | Status: DC
  Administered 2015-04-07 – 2015-04-08 (×2): 1 g via INTRAVENOUS
  Filled 2015-04-07 (×4): qty 50

## 2015-04-07 MED ORDER — LIDOCAINE HCL (CARDIAC) 20 MG/ML IV SOLN
INTRAVENOUS | Status: AC
  Filled 2015-04-07: qty 5

## 2015-04-07 MED ORDER — SUCCINYLCHOLINE CHLORIDE 20 MG/ML IJ SOLN
INTRAMUSCULAR | Status: AC
  Filled 2015-04-07: qty 1

## 2015-04-07 MED ORDER — PROMETHAZINE HCL 25 MG/ML IJ SOLN: 6.2500 mg | INTRAMUSCULAR | Status: DC | PRN

## 2015-04-07 MED ORDER — ONDANSETRON HCL 4 MG/2ML IJ SOLN
INTRAMUSCULAR | Status: AC
  Filled 2015-04-07: qty 2

## 2015-04-07 MED ORDER — OXYCODONE HCL 5 MG/5ML PO SOLN: 5.0000 mg | Freq: Once | ORAL | Status: DC | PRN

## 2015-04-07 MED ORDER — 0.9 % SODIUM CHLORIDE (POUR BTL) OPTIME
TOPICAL | Status: DC | PRN
  Administered 2015-04-07: 4000 mL

## 2015-04-07 MED ORDER — HYDROMORPHONE HCL 1 MG/ML IJ SOLN
0.2500 mg | INTRAMUSCULAR | Status: DC | PRN
  Administered 2015-04-07: 1 mg via INTRAVENOUS

## 2015-04-07 MED ORDER — MIDAZOLAM HCL 5 MG/5ML IJ SOLN
INTRAMUSCULAR | Status: DC | PRN
  Administered 2015-04-07 (×2): 1 mg via INTRAVENOUS

## 2015-04-07 MED ORDER — FENTANYL CITRATE (PF) 100 MCG/2ML IJ SOLN
INTRAMUSCULAR | Status: DC | PRN
  Administered 2015-04-07 (×2): 50 ug via INTRAVENOUS
  Administered 2015-04-07 (×2): 100 ug via INTRAVENOUS
  Administered 2015-04-07 (×4): 50 ug via INTRAVENOUS

## 2015-04-07 MED ORDER — ROCURONIUM BROMIDE 50 MG/5ML IV SOLN
INTRAVENOUS | Status: AC
  Filled 2015-04-07: qty 1

## 2015-04-07 MED ORDER — ONDANSETRON HCL 4 MG/2ML IJ SOLN
INTRAMUSCULAR | Status: DC | PRN
  Administered 2015-04-07 (×2): 4 mg via INTRAVENOUS

## 2015-04-07 MED ORDER — GLYCOPYRROLATE 0.2 MG/ML IJ SOLN
INTRAMUSCULAR | Status: DC | PRN
  Administered 2015-04-07: 0.6 mg via INTRAVENOUS

## 2015-04-07 MED ORDER — PANTOPRAZOLE SODIUM 40 MG PO TBEC: 40.0000 mg | DELAYED_RELEASE_TABLET | Freq: Every day | ORAL | Status: DC

## 2015-04-07 MED ORDER — ARTIFICIAL TEARS OP OINT
TOPICAL_OINTMENT | OPHTHALMIC | Status: AC
  Filled 2015-04-07: qty 3.5

## 2015-04-07 MED ORDER — GLYCOPYRROLATE 0.2 MG/ML IJ SOLN
INTRAMUSCULAR | Status: AC
  Filled 2015-04-07: qty 3

## 2015-04-07 MED ORDER — NEOSTIGMINE METHYLSULFATE 10 MG/10ML IV SOLN
INTRAVENOUS | Status: AC
  Filled 2015-04-07: qty 1

## 2015-04-07 MED ORDER — SUCCINYLCHOLINE CHLORIDE 20 MG/ML IJ SOLN
INTRAMUSCULAR | Status: DC | PRN
  Administered 2015-04-07: 100 mg via INTRAVENOUS

## 2015-04-07 MED ORDER — HYDROMORPHONE HCL 1 MG/ML IJ SOLN
0.5000 mg | INTRAMUSCULAR | Status: DC | PRN
  Administered 2015-04-07: 1 mg via INTRAVENOUS
  Filled 2015-04-07: qty 1

## 2015-04-07 MED ORDER — SCOPOLAMINE 1 MG/3DAYS TD PT72
1.0000 | MEDICATED_PATCH | TRANSDERMAL | Status: DC
  Administered 2015-04-07: 1.5 mg via TRANSDERMAL
  Filled 2015-04-07: qty 1

## 2015-04-07 MED ORDER — CEFAZOLIN SODIUM-DEXTROSE 2-3 GM-% IV SOLR
INTRAVENOUS | Status: AC
  Filled 2015-04-07: qty 50

## 2015-04-07 MED ORDER — HYDROMORPHONE HCL 2 MG PO TABS
2.0000 mg | ORAL_TABLET | ORAL | Status: DC | PRN
  Administered 2015-04-07 (×2): 4 mg via ORAL
  Administered 2015-04-08 (×2): 2 mg via ORAL
  Filled 2015-04-07: qty 1
  Filled 2015-04-07: qty 2
  Filled 2015-04-07: qty 1

## 2015-04-07 MED ORDER — DOCUSATE SODIUM 100 MG PO CAPS
100.0000 mg | ORAL_CAPSULE | Freq: Every day | ORAL | Status: DC
  Administered 2015-04-07: 100 mg via ORAL
  Filled 2015-04-07: qty 1

## 2015-04-07 MED ORDER — MIDAZOLAM HCL 2 MG/2ML IJ SOLN
INTRAMUSCULAR | Status: AC
  Filled 2015-04-07: qty 2

## 2015-04-07 MED ORDER — HYDROMORPHONE HCL 1 MG/ML IJ SOLN
INTRAMUSCULAR | Status: AC
  Filled 2015-04-07: qty 1

## 2015-04-07 MED ORDER — PROPOFOL 10 MG/ML IV BOLUS
INTRAVENOUS | Status: AC
  Filled 2015-04-07: qty 20

## 2015-04-07 MED ORDER — ACETAMINOPHEN 325 MG PO TABS: 650.0000 mg | ORAL_TABLET | Freq: Four times a day (QID) | ORAL | Status: DC | PRN

## 2015-04-07 MED ORDER — SODIUM CHLORIDE 0.9 % IJ SOLN
INTRAMUSCULAR | Status: AC
  Filled 2015-04-07: qty 10

## 2015-04-07 MED ORDER — ROCURONIUM BROMIDE 100 MG/10ML IV SOLN
INTRAVENOUS | Status: DC | PRN
  Administered 2015-04-07: 50 mg via INTRAVENOUS
  Administered 2015-04-07 (×2): 10 mg via INTRAVENOUS

## 2015-04-07 MED ORDER — LIDOCAINE HCL (CARDIAC) 20 MG/ML IV SOLN
INTRAVENOUS | Status: DC | PRN
  Administered 2015-04-07: 20 mg via INTRAVENOUS

## 2015-04-07 MED ORDER — PROPOFOL 10 MG/ML IV BOLUS
INTRAVENOUS | Status: DC | PRN
  Administered 2015-04-07: 20 mg via INTRAVENOUS
  Administered 2015-04-07: 30 mg via INTRAVENOUS
  Administered 2015-04-07: 150 mg via INTRAVENOUS

## 2015-04-07 MED ORDER — HEPARIN SODIUM (PORCINE) 5000 UNIT/ML IJ SOLN
5000.0000 [IU] | Freq: Once | INTRAMUSCULAR | Status: AC
  Administered 2015-04-07: 5000 [IU] via SUBCUTANEOUS
  Filled 2015-04-07: qty 1

## 2015-04-07 MED ORDER — DEXTROSE-NACL 5-0.45 % IV SOLN
INTRAVENOUS | Status: DC
  Administered 2015-04-07 – 2015-04-08 (×2): via INTRAVENOUS

## 2015-04-07 MED ORDER — NEOSTIGMINE METHYLSULFATE 10 MG/10ML IV SOLN
INTRAVENOUS | Status: DC | PRN
  Administered 2015-04-07: 4 mg via INTRAVENOUS

## 2015-04-07 MED ORDER — ARTIFICIAL TEARS OP OINT
TOPICAL_OINTMENT | OPHTHALMIC | Status: DC | PRN
  Administered 2015-04-07: 1 via OPHTHALMIC

## 2015-04-07 MED ORDER — FENTANYL CITRATE (PF) 100 MCG/2ML IJ SOLN
100.0000 ug | Freq: Once | INTRAMUSCULAR | Status: AC
  Administered 2015-04-07: 100 ug via INTRAVENOUS
  Filled 2015-04-07: qty 2

## 2015-04-07 MED ORDER — SCOPOLAMINE 1 MG/3DAYS TD PT72
MEDICATED_PATCH | TRANSDERMAL | Status: AC
Start: 2015-04-07 — End: 2015-04-07
  Filled 2015-04-07: qty 1

## 2015-04-07 MED ORDER — TECHNETIUM TC 99M SULFUR COLLOID FILTERED
1.0000 | Freq: Once | INTRAVENOUS | Status: AC | PRN
  Administered 2015-04-07: 1 via INTRADERMAL

## 2015-04-07 MED ORDER — PNEUMOCOCCAL VAC POLYVALENT 25 MCG/0.5ML IJ INJ: 0.5000 mL | INJECTION | INTRAMUSCULAR | Status: DC

## 2015-04-07 SURGICAL SUPPLY — 84 items
ADH SKN CLS APL DERMABOND .7 (GAUZE/BANDAGES/DRESSINGS) ×2
APPLIER CLIP 9.375 MED OPEN (MISCELLANEOUS) ×2
APR CLP MED 9.3 20 MLT OPN (MISCELLANEOUS) ×1
ATCH SMKEVC FLXB CAUT HNDSWH (FILTER) ×1 IMPLANT
BAG DECANTER FOR FLEXI CONT (MISCELLANEOUS) ×2 IMPLANT
BINDER BREAST LRG (GAUZE/BANDAGES/DRESSINGS) IMPLANT
BINDER BREAST XLRG (GAUZE/BANDAGES/DRESSINGS) ×1 IMPLANT
BIOPATCH RED 1 DISK 7.0 (GAUZE/BANDAGES/DRESSINGS) ×4 IMPLANT
BLADE 10 SAFETY STRL DISP (BLADE) ×2 IMPLANT
BLADE SURG 15 STRL LF DISP TIS (BLADE) IMPLANT
BLADE SURG 15 STRL SS (BLADE) ×2
CANISTER SUCTION 2500CC (MISCELLANEOUS) ×4 IMPLANT
CHLORAPREP W/TINT 26ML (MISCELLANEOUS) ×4 IMPLANT
CLIP APPLIE 9.375 MED OPEN (MISCELLANEOUS) ×1 IMPLANT
CONT SPEC 4OZ CLIKSEAL STRL BL (MISCELLANEOUS) ×2 IMPLANT
COVER PROBE W GEL 5X96 (DRAPES) ×2 IMPLANT
COVER SURGICAL LIGHT HANDLE (MISCELLANEOUS) ×4 IMPLANT
DERMABOND ADVANCED (GAUZE/BANDAGES/DRESSINGS) ×2
DERMABOND ADVANCED .7 DNX12 (GAUZE/BANDAGES/DRESSINGS) ×2 IMPLANT
DEVICE DISSECT PLASMABLAD 3.0S (MISCELLANEOUS) ×1 IMPLANT
DRAIN CHANNEL 19F RND (DRAIN) ×5 IMPLANT
DRAPE ORTHO SPLIT 77X108 STRL (DRAPES) ×4
DRAPE PROXIMA HALF (DRAPES) ×7 IMPLANT
DRAPE SURG 17X23 STRL (DRAPES) ×4 IMPLANT
DRAPE SURG ORHT 6 SPLT 77X108 (DRAPES) ×2 IMPLANT
DRAPE UTILITY XL STRL (DRAPES) ×4 IMPLANT
DRAPE WARM FLUID 44X44 (DRAPE) ×2 IMPLANT
DRSG PAD ABDOMINAL 8X10 ST (GAUZE/BANDAGES/DRESSINGS) ×3 IMPLANT
DRSG SORBAVIEW 3.5X5-5/16 MED (GAUZE/BANDAGES/DRESSINGS) ×4 IMPLANT
ELECT BLADE 4.0 EZ CLEAN MEGAD (MISCELLANEOUS) ×2
ELECT BLADE 6.5 EXT (BLADE) IMPLANT
ELECT CAUTERY BLADE 6.4 (BLADE) ×4 IMPLANT
ELECT REM PT RETURN 9FT ADLT (ELECTROSURGICAL) ×4
ELECTRODE BLDE 4.0 EZ CLN MEGD (MISCELLANEOUS) ×1 IMPLANT
ELECTRODE REM PT RTRN 9FT ADLT (ELECTROSURGICAL) ×2 IMPLANT
EVACUATOR SILICONE 100CC (DRAIN) ×5 IMPLANT
EVACUATOR SMOKE ACCUVAC VALLEY (FILTER) ×1
GLOVE BIO SURGEON STRL SZ7.5 (GLOVE) ×3 IMPLANT
GLOVE BIOGEL PI IND STRL 7.5 (GLOVE) IMPLANT
GLOVE BIOGEL PI IND STRL 8 (GLOVE) ×1 IMPLANT
GLOVE BIOGEL PI INDICATOR 7.5 (GLOVE) ×1
GLOVE BIOGEL PI INDICATOR 8 (GLOVE) ×1
GLOVE ECLIPSE 7.5 STRL STRAW (GLOVE) ×1 IMPLANT
GLOVE EUDERMIC 7 POWDERFREE (GLOVE) ×2 IMPLANT
GOWN STRL REUS W/ TWL LRG LVL3 (GOWN DISPOSABLE) ×3 IMPLANT
GOWN STRL REUS W/ TWL XL LVL3 (GOWN DISPOSABLE) ×2 IMPLANT
GOWN STRL REUS W/TWL LRG LVL3 (GOWN DISPOSABLE) ×6
GOWN STRL REUS W/TWL XL LVL3 (GOWN DISPOSABLE) ×4
GRAFT FLEX HD 8X16 THICK (Tissue Mesh) ×1 IMPLANT
ILLUMINATOR WAVEGUIDE N/F (MISCELLANEOUS) ×1 IMPLANT
IMPL BREAST SAL HP 560CC (Breast) IMPLANT
IMPLANT BREAST SAL HP 560CC (Breast) ×2 IMPLANT
KIT BASIN OR (CUSTOM PROCEDURE TRAY) ×4 IMPLANT
KIT MARKER MARGIN INK (KITS) ×4 IMPLANT
KIT ROOM TURNOVER OR (KITS) ×4 IMPLANT
MARKER SKIN DUAL TIP RULER LAB (MISCELLANEOUS) ×2 IMPLANT
NDL 18GX1X1/2 (RX/OR ONLY) (NEEDLE) ×1 IMPLANT
NDL HYPO 25GX1X1/2 BEV (NEEDLE) ×1 IMPLANT
NEEDLE 18GX1X1/2 (RX/OR ONLY) (NEEDLE) ×2 IMPLANT
NEEDLE HYPO 25GX1X1/2 BEV (NEEDLE) ×2 IMPLANT
NS IRRIG 1000ML POUR BTL (IV SOLUTION) ×6 IMPLANT
PACK GENERAL/GYN (CUSTOM PROCEDURE TRAY) ×4 IMPLANT
PAD ARMBOARD 7.5X6 YLW CONV (MISCELLANEOUS) ×4 IMPLANT
PLASMABLADE 3.0S (MISCELLANEOUS) ×2
PREFILTER EVAC NS 1 1/3-3/8IN (MISCELLANEOUS) ×2 IMPLANT
RETRACTOR YANK SUCT EIGR SABER (INSTRUMENTS) ×1 IMPLANT
SPECIMEN JAR X LARGE (MISCELLANEOUS) ×2 IMPLANT
SPONGE LAP 18X18 X RAY DECT (DISPOSABLE) ×3 IMPLANT
STAPLER VISISTAT 35W (STAPLE) ×1 IMPLANT
SUT ETHILON 3 0 FSL (SUTURE) IMPLANT
SUT MNCRL AB 3-0 PS2 18 (SUTURE) ×8 IMPLANT
SUT MNCRL AB 4-0 PS2 18 (SUTURE) IMPLANT
SUT PDS AB 3-0 SH 27 (SUTURE) ×6 IMPLANT
SUT PDS AB 4-0 RB1 27 (SUTURE) IMPLANT
SUT PROLENE 3 0 PS 2 (SUTURE) ×6 IMPLANT
SUT SILK 2 0 FS (SUTURE) IMPLANT
SUT SILK 2 0 SH (SUTURE) ×4 IMPLANT
SUT VIC AB 3-0 SH 18 (SUTURE) ×4 IMPLANT
SYR BULB IRRIGATION 50ML (SYRINGE) ×2 IMPLANT
SYR CONTROL 10ML LL (SYRINGE) ×2 IMPLANT
TOWEL OR 17X24 6PK STRL BLUE (TOWEL DISPOSABLE) ×4 IMPLANT
TOWEL OR 17X26 10 PK STRL BLUE (TOWEL DISPOSABLE) ×4 IMPLANT
TRAY FOLEY CATH 16FR SILVER (SET/KITS/TRAYS/PACK) IMPLANT
TUBE CONNECTING 12X1/4 (SUCTIONS) ×3 IMPLANT

## 2015-04-07 NOTE — Anesthesia Preprocedure Evaluation (Addendum)
Anesthesia Evaluation  Patient identified by MRN, date of birth, ID band Patient awake    Reviewed: Allergy & Precautions, NPO status , Patient's Chart, lab work & pertinent test results  History of Anesthesia Complications (+) PONV  Airway Mallampati: II  TM Distance: >3 FB Neck ROM: full    Dental   Pulmonary former smoker,  breath sounds clear to auscultation        Cardiovascular negative cardio ROS  Rhythm:regular Rate:Normal     Neuro/Psych Anxiety negative neurological ROS     GI/Hepatic Neg liver ROS, GERD-  ,  Endo/Other  negative endocrine ROS  Renal/GU negative Renal ROS     Musculoskeletal   Abdominal   Peds  Hematology negative hematology ROS (+)   Anesthesia Other Findings   Reproductive/Obstetrics                            Anesthesia Physical  Anesthesia Plan  ASA: II  Anesthesia Plan: General and Regional   Post-op Pain Management:    Induction: Intravenous  Airway Management Planned: Oral ETT  Additional Equipment:   Intra-op Plan:   Post-operative Plan: Extubation in OR  Informed Consent: I have reviewed the patients History and Physical, chart, labs and discussed the procedure including the risks, benefits and alternatives for the proposed anesthesia with the patient or authorized representative who has indicated his/her understanding and acceptance.     Plan Discussed with: CRNA  Anesthesia Plan Comments:         Anesthesia Quick Evaluation

## 2015-04-07 NOTE — Op Note (Signed)
[name], [name] NO.:  [id number]  MEDICAL RECORD NO.:  [id number]  LOCATION:  [facility]                          FACILITY:  [facility]  PHYSICIAN:  Crissie Reese, M.D.     DATE OF BIRTH:  [birth year]  DATE OF PROCEDURE:  04/07/2015 DATE OF DISCHARGE:                              OPERATIVE REPORT   PREOPERATIVE DIAGNOSIS:  Left breast cancer.  POSTOPERATIVE DIAGNOSIS:  Left breast cancer.  PROCEDURES PERFORMED: 1. Immediate left breast reconstruction with saline implant. 2. Distinct procedure, chest wall reconstruction with acellular dermal     Matrix greater than 120 cm sq. due to inadequate muscle and resetting of inframammary fold.  SURGEON:  Crissie Reese, M.D.  ANESTHESIA:  General.  ESTIMATED BLOOD LOSS:  30 mL.  DRAINS:  Two 19-French.  CLINICAL NOTE:  A 64 year old woman who has left breast cancer and is having mastectomy.  She elected to have a nipple-sparing mastectomy. She did desire reconstruction and reconstruction with implant and acellular dermal matrix was discussed with her.  She understood the nature of this procedure.  She selected saline implants rather than silicone gel.  She understood the nature of acellular dermal matrix and the rationale for its use.  She understood convalescence as well as risks and possible complications, which included, but were not limited to, bleeding, infection, healing problems, scarring, loss of sensation, fluid accumulations, loss of sensation in nipple, loss of tissue, loss of nipple, loss of skin, pneumothorax, DVT, PE, contour deformities, contour deformities of the periphery of the reconstruction, asymmetry, chronic pain, failure of the device, capsular contracture, displacement of device, wrinkles and ripples, and overall disappointment as well as secondary procedures.  She understood all of this and wished to proceed.  DESCRIPTION OF PROCEDURE:  The patient was is in the operating room and the  nipple-sparing mastectomy had been completed.  The skin flaps appeared to be viable.  Thorough irrigation with saline as well as antibiotic solution.  The pectoralis muscle was lifted and the serratus anterior for short distance as well in an intramuscular dissection for the serratus anterior.  The acellular dermal matrix was soaked in saline for more than 10 minutes and then was soaked in antibiotic solution for 15 minutes.  It was pie-crusted.  Two 19-French drains were positioned, brought through separate stab wounds, one inferomedial and the other inferolateral and secured with 3-0 Prolene sutures.  The implant was prepared after thoroughly cleaning gloves.  This was a Mentor 675 mL maximum-fill high-profile implant.  It was prepared by placing 100 mL of sterile saline using a closed filling system and then returned to antibiotic solution to soak.  It soaked for well over 10 minutes in antibiotic solution.  The space was inspected and found to have excellent hemostasis.  Antibiotic solution was placed.  The ADM was then sutured in position on the inframammary fold and then laterally as well using 3-0 PDS interrupted simple sutures and sutures were taken also to recreate the inframammary fold as well.  Once this would be completed, these were placed and left untied along the closure for the superior edge of the ADM with the muscle.  After thoroughly cleaning gloves, implant  was positioned and then filled with a maximum 675 mL of saline using the closed filling system.  The remaining sutures were then tied securely.  The skin was then redraped and appeared to give her a good contour and fairly good symmetry with the opposite side in terms of volume.  The superior border of the skin incision was then excised due to the retraction that had been performed.  This edge of skin bled nicely and then the closure with 3-0 Monocryl interrupted inverted deep dermal sutures and few interspersed 3-0  Prolene simple sutures for reinforcement.  Dermabond applied.  Drains were dressed with Biopatch and SorbaView dressings and dry sterile dressing was then applied over the breast and the breast incision and the chest binder was then placed and avoiding excessive pressure and she was transferred to the recovery room stable having tolerated the procedure well.  She will be observed in the hospital overnight.     Crissie Reese, M.D.     DB/MEDQ  D:  04/07/2015  T:  04/07/2015  Job:  [id number]

## 2015-04-07 NOTE — Brief Op Note (Signed)
04/07/2015  2:18 PM  PATIENT:  [name]  64 y.o. female  PRE-OPERATIVE DIAGNOSIS:  cancer left breast  POST-OPERATIVE DIAGNOSIS:  cancer left breast  PROCEDURE:  Procedure(s): LEFT NIPPLE SPARING MASTECTOMY WITH LEFT SENTINAL LYMPH NODE BIOPSY AND  RECONSTRUCTION WITH PLACEMENT OF TISSUE EXPANDER (Left) IMMEDIATE LEFT BREAST RECONSTRUCTION WITH PLACEMENT SALINE IMPLANT AND CHEST WALL RECONSTRUCTION WITH  ACELLULAR DERMAL MATRIX  (Left)  SURGEON:  Surgeon(s) and Role: Panel 1:    * Fanny Skates, MD - Primary  Panel 2:    * Crissie Reese, MD - Primary  PHYSICIAN ASSISTANT:   ASSISTANTS: none   ANESTHESIA:   general  EBL:  Total I/O In: 1800 [I.V.:1800] Out: 500 [Urine:400; Blood:100]  BLOOD ADMINISTERED:none  DRAINS: (2) Jackson-Pratt drain(s) with closed bulb suction in the left chest    LOCAL MEDICATIONS USED:  NONE  SPECIMEN:  No Specimen  DISPOSITION OF SPECIMEN:  N/A  COUNTS:  YES  TOURNIQUET:  * No tourniquets in log *  DICTATION: .Other Dictation: Dictation Number [id number]  PLAN OF CARE: Admit for overnight observation  PATIENT DISPOSITION:  PACU - hemodynamically stable.   Delay start of Pharmacological VTE agent (>24hrs) due to surgical blood loss or risk of bleeding: no

## 2015-04-07 NOTE — Transfer of Care (Addendum)
Immediate Anesthesia Transfer of Care Note  Patient: [name]  Procedure(s) Performed: Procedure(s): LEFT NIPPLE SPARING MASTECTOMY WITH LEFT SENTINAL LYMPH NODE BIOPSY AND  RECONSTRUCTION WITH PLACEMENT OF TISSUE EXPANDER (Left) IMMEDIATE LEFT BREAST RECONSTRUCTION WITH PLACEMENT SALINE IMPLANT AND CHEST WALL RECONSTRUCTION WITH  ACELLULAR DERMAL MATRIX  (Left)  Patient Location: PACU  Anesthesia Type:General  Level of Consciousness: awake, alert , oriented and patient cooperative  Airway & Oxygen Therapy: Patient Spontanous Breathing and Patient connected to nasal cannula oxygen  Post-op Assessment: Report given to RN and Post -op Vital signs reviewed and stable  Post vital signs: Reviewed and stable  Last Vitals:  Filed Vitals:   04/07/15 1600  BP:   Pulse: 65  Temp: 36.4 C  Resp: 11    Complications: No apparent anesthesia complications

## 2015-04-07 NOTE — Anesthesia Procedure Notes (Addendum)
Anesthesia Regional Block:  Pectoralis block  Pre-Anesthetic Checklist: ,, timeout performed, Correct Patient, Correct Site, Correct Laterality, Correct Procedure, Correct Position, site marked, Risks and benefits discussed,  Surgical consent,  Pre-op evaluation,  At surgeon's request and post-op pain management  Laterality: Left  Prep: chloraprep       Needles:  Injection technique: Single-shot  Needle Type: Echogenic Needle     Needle Length: 9cm 9 cm Needle Gauge: 21 and 21 G    Additional Needles: Pectoralis block Narrative:  Start time: 04/07/2015 8:45 AM End time: 04/07/2015 8:55 AM Injection made incrementally with aspirations every 5 mL.  Performed by: Personally  Anesthesiologist: Suzette Battiest  Additional Notes: Risks and benefits discussed. Pt tolerated well with no immediate complications.   Procedure Name: Intubation Date/Time: 04/07/2015 9:50 AM Performed by: Willeen Cass P Pre-anesthesia Checklist: Patient identified, Timeout performed, Emergency Drugs available, Suction available and Patient being monitored Patient Re-evaluated:Patient Re-evaluated prior to inductionOxygen Delivery Method: Circle system utilized Preoxygenation: Pre-oxygenation with 100% oxygen Intubation Type: IV induction Ventilation: Mask ventilation without difficulty Laryngoscope Size: Mac and 3 Grade View: Grade I Tube type: Oral Tube size: 7.0 mm Number of attempts: 1 Airway Equipment and Method: Stylet Placement Confirmation: ETT inserted through vocal cords under direct vision,  breath sounds checked- equal and bilateral and positive ETCO2 Secured at: 21 cm Tube secured with: Tape Dental Injury: Teeth and Oropharynx as per pre-operative assessment

## 2015-04-07 NOTE — Interval H&P Note (Signed)
History and Physical Interval Note:  04/07/2015 8:28 AM  [name]  has presented today for surgery, with the diagnosis of cancer left breast  The various methods of treatment have been discussed with the patient and family. After consideration of risks, benefits and other options for treatment, the patient has consented to  Procedure(s): LEFT NIPPLE SPARING MASTECTOMY WITH LEFT SENTINAL LYMPH NODE BIOPSY AND  RECONSTRUCTION WITH PLACEMENT OF TISSUE EXPANDER (Left) LEFT BREAST RECONSTRUCTION WITH PLACEMENT OF TISSUE EXPANDER OR POSSBILE IMPLANT AND ACELLULAR DERMAL MATRIX  (Left) as a surgical intervention .  The patient's history has been reviewed, patient examined, no change in status, stable for surgery.  I have reviewed the patient's chart and labs.  Questions were answered to the patient's satisfaction.     [name]

## 2015-04-07 NOTE — Op Note (Signed)
Patient Name:           [name]   Date of Surgery:        04/07/2015  Pre op Diagnosis:      Ductal carcinoma in situ left breast, upper outer quadrant, ER positive                                       Persistent positive margins, status post partial mastectomy 2  Post op Diagnosis:    Same  Procedure:                 Left nipple sparing mastectomy, left axillary sentinel node biopsy  Surgeon:                     Edsel Petrin. Dalbert Batman, M.D., FACS  Assistant:                      RNFA and Dr. Harlow Mares  Operative Indications:   . This is a 64 year old female who returns for further discussion and final decision making regarding her intermediate grade DCIS left breast, which is ER positive and PR negative. On Jan 04, 2015 she underwent left partial mastectomy and she had very close posterior and very close inferior margins. On May 11 she underwent a second left partial mastectomy with reexcision of posterior and reexcision of inferior margin. The inferior margin was again very close to the posterior margin looked fine. Subsequent mammogram showed residual calcifications in inferiorly and anteriorly.  anteriorly. Dr. Glennon Mac performed another biopsy fairly close to the lumpectomy cavity and this again showed DCIS. She has seen Crissie Reese. She was interested in lumpectomy. We have described a generous radial ellipse, almost a quadrantectomy which may require subsequent revision after radiation therapy. We have described nipple sparing mastectomy which we think is reasonable as well as standard mastectomy. After thinking through all these options she wants to proceed with left nipple sparing mastectomy with immediate reconstruction. She knows that she may lose the nipple because of circulation problems or if we find cancer on the biopsy. She knows that her nipple will be insensate. She does not want to have a fourth operation. She is happy about the probability of not having  radiation therapy and she likes the idea of having immediate symmetry without subsequent operations. She understands that she might need second surgery for any kind of complication we discussed and is comfortable with that. I have reviewed her histology and all of her imaging findings  with Dr. Crissie Reese and Dr. Margarette Canada. She'll be scheduled for left nipple sparing mastectomy, left sentinel lymph node biopsy. We will coordinate with Dr. Harlow Mares.   Operative Findings:       There was a lot of scarring in the upper outer quadrant from the previous lumpectomies.  There was no other palpable abnormality in the breast.  I found 2 sentinel lymph nodes which I was able to remove through the inframammary incision.  Procedure in Detail:          The patient underwent injection of radionuclide in the holding area by the nuclear medicine technician.  The patient was taken to the operating room and underwent general endotracheal anesthesia.  The neck and entire bilateral chest walls were prepped and draped in a sterile fashion.  Intravenous antibiotic's were given.  Surgical timeout was performed.  Using  a marking pin I marked the inframammary crease, midline and the presumed location of the second intercostal artery.      A 10 cm inframammary incision was made.  Dissection was carried down perpendicular to the anterior rectus sheath.  I raised the skin flaps superiorly using the plasma blade.  I took the dissection up to the areola.  I then mobilized the breast off of the pectoralis major muscle centrally, medially, and laterally so that it would pull down .  I then carefully took the dissection under the nipple and areola.  I biopsied the posterior aspect of the nipple and sent that as a separate permanent histology.  I raised the skin flaps medially to the parasternal area being careful to avoid the second intercostal artery, superiorly to the  infraclavicular area, laterally to latissimus dorsi muscle.  I  continued the dissection posteriorly and  the breast was amputated.  I marked the superior and lateral skin margins with silk sutures and sent that specimen to the lab separately.  Using the neoprobe I found 2 sentinel lymph nodes in the level I axilla and sent that separately.  This seemed to be all the sentinel nodes.  Hemostasis was excellent and achieved with  electrocautery as well as the marker clips.  Dr. Harlow Mares and  I irrigated the wound copiously and the did not appear to be any bleeding.  At this point in the case estimated blood loss was 50 mL, counts correct.  Complications none.  Rest of the reconstructive procedure will be performed by Dr. Harlow Mares he will dictate that separately.     Edsel Petrin. Dalbert Batman, M.D., FACS General and Minimally Invasive Surgery Breast and Colorectal Surgery  04/07/2015 12:15 PM

## 2015-04-08 ENCOUNTER — Encounter (HOSPITAL_COMMUNITY): Payer: Self-pay | Admitting: General Surgery

## 2015-04-08 DIAGNOSIS — D0512 Intraductal carcinoma in situ of left breast: Secondary | ICD-10-CM | POA: Diagnosis not present

## 2015-04-08 MED ORDER — ENOXAPARIN (LOVENOX) PATIENT EDUCATION KIT
PACK | Freq: Once | Status: DC
  Filled 2015-04-08: qty 1

## 2015-04-08 MED ORDER — SULFAMETHOXAZOLE-TRIMETHOPRIM 800-160 MG PO TABS
1.0000 | ORAL_TABLET | Freq: Two times a day (BID) | ORAL | Status: DC
  Administered 2015-04-08: 1 via ORAL
  Filled 2015-04-08: qty 1

## 2015-04-08 MED ORDER — ENOXAPARIN SODIUM 40 MG/0.4ML ~~LOC~~ SOLN: 40.0000 mg | SUBCUTANEOUS | Status: DC

## 2015-04-08 MED ORDER — METHOCARBAMOL 500 MG PO TABS
500.0000 mg | ORAL_TABLET | Freq: Four times a day (QID) | ORAL | Status: DC
Start: 2015-04-08 — End: 2015-10-10

## 2015-04-08 MED ORDER — HYDROMORPHONE HCL 2 MG PO TABS: 2.0000 mg | ORAL_TABLET | ORAL | Status: DC | PRN

## 2015-04-08 MED ORDER — SULFAMETHOXAZOLE-TRIMETHOPRIM 800-160 MG PO TABS: 1.0000 | ORAL_TABLET | Freq: Two times a day (BID) | ORAL | Status: DC

## 2015-04-08 MED ORDER — DOCUSATE SODIUM 100 MG PO CAPS: 100.0000 mg | ORAL_CAPSULE | Freq: Every day | ORAL | Status: DC

## 2015-04-08 NOTE — Discharge Summary (Signed)
Physician Discharge Summary  Patient ID: [name] MRN: [id number] DOB/AGE: April 29, 1951 64 y.o.  Admit date: 04/07/2015 Discharge date: 04/08/2015  Admission Diagnoses: Left breast cancer  Discharge Diagnoses: Same Active Problems:   Breast cancer, left breast   Discharged Condition: good  Hospital Course: On the day of admission the patient was taken to surgery and had left nipple sparing mastectomy and sentinel node and immediate breast reconstruction with saline implant and ADM.Marland Kitchen The patient tolerated the procedures well. Postoperatively, the mastectomy flap maintained good color and capillary refill.There is no evidence of vascular compromise at present. The patient was ambulatory and tolerating diet on the first postoperative day. She is ready for discharge.  Treatments: antibiotics: Ancef, anticoagulation: LMW heparin and surgery: left nipple sparing mastectomy and reconstruction.  Discharge Exam: Blood pressure 106/44, pulse 76, temperature 98.5 F (36.9 C), temperature source Oral, resp. rate 20, weight 191 lb 3 oz (86.722 kg), SpO2 95 %.  Operative sites: Mastectomy flaps viable. Implant in good position. Drains functioning. Drainage thin. There is no evidence of bleeding or infection.  Disposition: 01-Home or Self Care     Medication List    STOP taking these medications        aspirin 81 MG EC tablet     BIOTIN 5000 PO     vitamin C 500 MG tablet  Commonly known as:  ASCORBIC ACID     Vitamin D3 2000 UNITS Tabs     vitamin E 400 UNIT capsule      TAKE these medications        CALCIUM 600 + D PO  Take 2 tablets by mouth daily.     CENTRUM SILVER PO  Take 1 tablet by mouth daily.     docusate sodium 100 MG capsule  Commonly known as:  COLACE  Take 1 capsule (100 mg total) by mouth daily.     enoxaparin 40 MG/0.4ML injection  Commonly known as:  LOVENOX  Inject 0.4 mLs (40 mg total) into the skin daily.     esomeprazole 20 MG capsule  Commonly  known as:  NEXIUM  Take 20 mg by mouth daily at 12 noon.     HYDROmorphone 2 MG tablet  Commonly known as:  DILAUDID  Take 1-2 tablets (2-4 mg total) by mouth every 4 (four) hours as needed for moderate pain.     methocarbamol 500 MG tablet  Commonly known as:  ROBAXIN  Take 1 tablet (500 mg total) by mouth 4 (four) times daily.     sulfamethoxazole-trimethoprim 800-160 MG per tablet  Commonly known as:  BACTRIM DS,SEPTRA DS  Take 1 tablet by mouth every 12 (twelve) hours.           Follow-up Information    Follow up with [name], MD On 05/05/2015.   Specialty:  General Surgery   Contact information:   [address] [place] [address] [phone]       Signed: Macon Large 04/08/2015, 8:36 AM

## 2015-04-08 NOTE — Progress Notes (Signed)
DC home with daughter. DC instructions verbally understood. Daughter gave Lovenox injection this am without difficulty.Drain instructions given to pt and daughter. Both stated they understood drain care. No verbal questions at this time.

## 2015-04-08 NOTE — Discharge Instructions (Addendum)
No lifting for 6 weeks No vigorous activity for 6 weeks (including outdoor walks) No driving for 4 weeks OK to walk up stairs slowly Stay propped up Use incentive spirometer at home every hour while awake No shower while drains are in place Empty drains at least three times a day and record the amounts separately Change drain dressings every third day if instructed to do so by Dr. Harlow Mares (no need to do this yet)  Apply Bacitracin antibiotic ointment to the drain sites  Place gauze dressing over drains  Secure the gauze with tape Take an over-the-counter Probiotic while on antibiotics Take an over-the-counter stool softener (such as Colace) while on pain medication See Dr. Harlow Mares next week. For questions call [phone] or [phone]

## 2015-04-08 NOTE — Progress Notes (Signed)
1 Day Post-Op  Subjective: Alert.  Stable. Tolerating diet.  Ambulating to bathroom. She reports pain is fairly well controlled with oral analgesics.  Objective: Vital signs in last 24 hours: Temp:  [96.8 F (36 C)-98.2 F (36.8 C)] 98.2 F (36.8 C) (08/05 0120) Pulse Rate:  [54-83] 75 (08/05 0120) Resp:  [8-22] 16 (08/05 0120) BP: (110-160)/(50-90) 110/50 mmHg (08/05 0120) SpO2:  [94 %-100 %] 94 % (08/05 0120) Weight:  [86.722 kg (191 lb 3 oz)] 86.722 kg (191 lb 3 oz) (08/04 0806) Last BM Date: 04/06/15  Intake/Output from previous day: 08/04 0701 - 08/05 0700 In: 3568.3 [P.O.:360; I.V.:3108.3; IV Piggyback:100] Out: 1980 [Urine:1700; Drains:180; Blood:100] Intake/Output this shift: Total I/O In: 1590 [P.O.:360; I.V.:1130; IV Piggyback:100] Out: 1440 [Urine:1300; Drains:140]  General appearance: Alert.  Cooperative.  Mental status normal.  No distress. Breasts: Left nipple sparing mastectomy skin flaps looked good.  Some ecchymoses centrally, periareolar.  Possible venous congestion.  No hematoma or bleeding.  Lab Results:  No results found for this or any previous visit (from the past 24 hour(s)).   Studies/Results: Nm Sentinel Node Inj-no Rpt (breast)  04/07/2015   CLINICAL DATA: cancer left breast   Sulfur colloid was injected intradermally by the nuclear medicine  technologist for breast cancer sentinel node localization.     Marland Kitchen  ceFAZolin (ANCEF) IV  1 g Intravenous 4 times per day  . docusate sodium  100 mg Oral Daily  . enoxaparin (LOVENOX) injection  40 mg Subcutaneous Q24H  . methocarbamol  500 mg Oral QID  . pantoprazole  40 mg Oral Daily  . pneumococcal 23 valent vaccine  0.5 mL Intramuscular Tomorrow-1000  . scopolamine  1 patch Transdermal Q72H     Assessment/Plan: s/p Procedure(s): LEFT NIPPLE SPARING MASTECTOMY WITH LEFT SENTINAL LYMPH NODE BIOPSY AND  RECONSTRUCTION WITH PLACEMENT OF TISSUE EXPANDER IMMEDIATE LEFT BREAST RECONSTRUCTION WITH  PLACEMENT SALINE IMPLANT AND CHEST WALL RECONSTRUCTION WITH  ACELLULAR DERMAL MATRIX   POD #1.  Left nipple sparing mastectomy and sentinel node biopsy and saline implant and acellular dermal matrix reconstruction. Stable I told her that the pathology would be out Monday or Tuesday and I would discuss that with her She has an appointment to see me in the office on September 1 Wound care, drain care, and discharge per Dr. Harlow Mares.  @PROBHOSP @     Jameson Tormey M 04/08/2015  . .prob

## 2015-04-10 NOTE — Anesthesia Postprocedure Evaluation (Signed)
  Anesthesia Post-op Note  Patient: [name]  Procedure(s) Performed: Procedure(s): LEFT NIPPLE SPARING MASTECTOMY WITH LEFT SENTINAL LYMPH NODE BIOPSY AND  RECONSTRUCTION WITH PLACEMENT OF TISSUE EXPANDER (Left) IMMEDIATE LEFT BREAST RECONSTRUCTION WITH PLACEMENT SALINE IMPLANT AND CHEST WALL RECONSTRUCTION WITH  ACELLULAR DERMAL MATRIX  (Left)  Patient Location: PACU  Anesthesia Type:General and GA combined with regional for post-op pain  Level of Consciousness: awake, alert  and oriented  Airway and Oxygen Therapy: Patient Spontanous Breathing  Post-op Pain: mild  Post-op Assessment: Post-op Vital signs reviewed              Post-op Vital Signs: Reviewed  Last Vitals:  Filed Vitals:   04/08/15 0615  BP: 106/44  Pulse: 76  Temp: 36.9 C  Resp: 20    Complications: No apparent anesthesia complications

## 2015-04-11 NOTE — Progress Notes (Signed)
Quick Note:  Inform patient of Pathology report,.tell her that the cancer was completely removed with a negative margin. The lymph nodes are negative. The nipple biopsy is negative. This is good news. Let me know that you spoke to her.  hmi ______

## 2015-04-25 ENCOUNTER — Encounter: Payer: Self-pay | Admitting: Hematology and Oncology

## 2015-04-25 ENCOUNTER — Ambulatory Visit (HOSPITAL_BASED_OUTPATIENT_CLINIC_OR_DEPARTMENT_OTHER): Payer: Medicare Other | Admitting: Hematology and Oncology

## 2015-04-25 ENCOUNTER — Encounter: Payer: Self-pay | Admitting: *Deleted

## 2015-04-25 ENCOUNTER — Telehealth: Payer: Self-pay | Admitting: Hematology and Oncology

## 2015-04-25 VITALS — BP 142/74 | HR 74 | Temp 97.5°F | Resp 18 | Ht 63.0 in | Wt 190.7 lb

## 2015-04-25 DIAGNOSIS — C50412 Malignant neoplasm of upper-outer quadrant of left female breast: Secondary | ICD-10-CM

## 2015-04-25 DIAGNOSIS — D0512 Intraductal carcinoma in situ of left breast: Secondary | ICD-10-CM | POA: Diagnosis not present

## 2015-04-25 MED ORDER — TAMOXIFEN CITRATE 20 MG PO TABS: 20.0000 mg | ORAL_TABLET | Freq: Every day | ORAL | Status: DC

## 2015-04-25 NOTE — Progress Notes (Signed)
Patient Care Team: Levin Erp, MD as PCP - General (Internal Medicine)  DIAGNOSIS: Breast cancer of upper-outer quadrant of left female breast   Staging form: Breast, AJCC 7th Edition     Clinical: Stage 0 (Tis (DCIS), N0, M0) - Unsigned   SUMMARY OF ONCOLOGIC HISTORY:   Breast cancer of upper-outer quadrant of left female breast   12/13/2014 Breast MRI Left breast patchy diffuse multifocal enhancement with postbiopsy changes   01/04/2015 Surgery Left lumpectomy: DCIS 3.3 cm, grade 2, ER 100%, PR 0%, margins were close underwent reexcision margins close less than 1 mm   04/07/2015 Surgery left mastectomy: DCIS with calcifications, ALH, LCIS, 0/2 lymph nodes negative, grade 2, tumor size 3 cm, margins 3 mm, ER 100%, PR 10% Tis N0 stage 0    CHIEF COMPLIANT: follow-up after left mastectomy  INTERVAL HISTORY: [name] is a 64 year old with above-mentioned history of left breast DCIS who underwent lumpectomy surgeries but still had close margins. She is to have residual disease and finally underwent left mastectomy on 04/07/2015. She underwent immediate reconstruction and still has 2 drains in place.she is sore from surgery and reconstruction. She is hoping to have one of the drains taken out today. The other drain is still having 20 mL of output.  REVIEW OF SYSTEMS:   Constitutional: Denies fevers, chills or abnormal weight loss Eyes: Denies blurriness of vision Ears, nose, mouth, throat, and face: Denies mucositis or sore throat Respiratory: Denies cough, dyspnea or wheezes Cardiovascular: Denies palpitation, chest discomfort or lower extremity swelling Gastrointestinal:  Denies nausea, heartburn or change in bowel habits Skin: Denies abnormal skin rashes Lymphatics: Denies new lymphadenopathy or easy bruising Neurological:Denies numbness, tingling or new weaknesses Behavioral/Psych: Mood is stable, no new changes  Breast: soreness in the left breast from recent surgery All other  systems were reviewed with the patient and are negative.  I have reviewed the past medical history, past surgical history, social history and family history with the patient and they are unchanged from previous note.  ALLERGIES:  has No Known Allergies.  MEDICATIONS:  Current Outpatient Prescriptions  Medication Sig Dispense Refill  . Calcium Carbonate-Vitamin D (CALCIUM 600 + D PO) Take 2 tablets by mouth daily.      Marland Kitchen docusate sodium (COLACE) 100 MG capsule Take 1 capsule (100 mg total) by mouth daily. 10 capsule 0  . enoxaparin (LOVENOX) 40 MG/0.4ML injection Inject 0.4 mLs (40 mg total) into the skin daily. 12 Syringe 0  . esomeprazole (NEXIUM) 20 MG capsule Take 20 mg by mouth daily at 12 noon.     Marland Kitchen HYDROmorphone (DILAUDID) 2 MG tablet Take 1-2 tablets (2-4 mg total) by mouth every 4 (four) hours as needed for moderate pain. 50 tablet 0  . methocarbamol (ROBAXIN) 500 MG tablet Take 1 tablet (500 mg total) by mouth 4 (four) times daily. 40 tablet 1  . Multiple Vitamins-Minerals (CENTRUM SILVER PO) Take 1 tablet by mouth daily.     . tamoxifen (NOLVADEX) 20 MG tablet Take 1 tablet (20 mg total) by mouth daily. 90 tablet 3   No current facility-administered medications for this visit.    PHYSICAL EXAMINATION: ECOG PERFORMANCE STATUS: 1 - Symptomatic but completely ambulatory  Filed Vitals:   04/25/15 1135  BP: 142/74  Pulse: 74  Temp: 97.5 F (36.4 C)  Resp: 18   Filed Weights   04/25/15 1135  Weight: 190 lb 11.2 oz (86.501 kg)    GENERAL:alert, no distress and comfortable SKIN: skin color,  texture, turgor are normal, no rashes or significant lesions EYES: normal, Conjunctiva are pink and non-injected, sclera clear OROPHARYNX:no exudate, no erythema and lips, buccal mucosa, and tongue normal  NECK: supple, thyroid normal size, non-tender, without nodularity LYMPH:  no palpable lymphadenopathy in the cervical, axillary or inguinal LUNGS: clear to auscultation and  percussion with normal breathing effort HEART: regular rate & rhythm and no murmurs and no lower extremity edema ABDOMEN:abdomen soft, non-tender and normal bowel sounds Musculoskeletal:no cyanosis of digits and no clubbing  NEURO: alert & oriented x 3 with fluent speech, no focal motor/sensory deficits  LABORATORY DATA:  I have reviewed the data as listed   Chemistry      Component Value Date/Time   NA 141 04/01/2015 1429   K 3.9 04/01/2015 1429   CL 109 04/01/2015 1429   CO2 24 04/01/2015 1429   BUN 9 04/01/2015 1429   CREATININE 0.75 04/01/2015 1429      Component Value Date/Time   CALCIUM 9.4 04/01/2015 1429   ALKPHOS 92 04/01/2015 1429   AST 25 04/01/2015 1429   ALT 24 04/01/2015 1429   BILITOT 1.3* 04/01/2015 1429       Lab Results  Component Value Date   WBC 10.5 04/01/2015   HGB 14.0 04/01/2015   HCT 40.1 04/01/2015   MCV 91.3 04/01/2015   PLT 280 04/01/2015   NEUTROABS 5.0 04/01/2015   ASSESSMENT & PLAN:  Breast cancer of upper-outer quadrant of left female breast Left lumpectomy 01/04/2015: DCIS 3.3 cm, grade 2, ER 100%, PR 0%, margins were close underwent reexcision margins negative, repeat mammogram showed calcifications and biopsy showed DCIS  left mastectomy with immediate reconstruction 04/07/2015: DCIS with calcifications, ALH, LCIS, 0/2 lymph nodes negative, grade 2, tumor size 3 cm, margins 3 mm, ER 100%, PR 10% Tis N0 stage 0  Recommendation: 1. Adjuvant tamoxifen 20 mg daily 5 years to start mid September 2016. I encouraged her to wait for the breast to heal before starting on antiestrogen therapy.  Tamoxifen counseling:We discussed the risks and benefits of tamoxifen. These include but not limited to insomnia, hot flashes, mood changes, vaginal dryness, and weight gain. Although rare, serious side effects including endometrial cancer, risk of blood clots were also discussed. We strongly believe that the benefits far outweigh the risks. Patient  understands these risks and consented to starting treatment. Planned treatment duration is 5 years.  Return to clinic mid-October for toxicity evaluation.    No orders of the defined types were placed in this encounter.   The patient has a good understanding of the overall plan. she agrees with it. she will call with any problems that may develop before the next visit here.   Rulon Eisenmenger, MD

## 2015-04-25 NOTE — Telephone Encounter (Signed)
Appointments made and avs printed for patient °

## 2015-04-25 NOTE — Assessment & Plan Note (Signed)
Left lumpectomy 01/04/2015: DCIS 3.3 cm, grade 2, ER 100%, PR 0%, margins were close underwent reexcision margins negative, repeat mammogram showed calcifications and biopsy showed DCIS  left mastectomy with immediate reconstruction 04/07/2015: DCIS with calcifications, ALH, LCIS, 0/2 lymph nodes negative, grade 2, tumor size 3 cm, margins 3 mm, ER 100%, PR 10% Tis N0 stage 0  Recommendation: 1. Adjuvant tamoxifen 20 mg daily 5 years to start mid September 2016. I encouraged her to wait for the breast to heal before starting on antiestrogen therapy.  Tamoxifen counseling:We discussed the risks and benefits of tamoxifen. These include but not limited to insomnia, hot flashes, mood changes, vaginal dryness, and weight gain. Although rare, serious side effects including endometrial cancer, risk of blood clots were also discussed. We strongly believe that the benefits far outweigh the risks. Patient understands these risks and consented to starting treatment. Planned treatment duration is 5 years.  Return to clinic mid-October for toxicity evaluation.

## 2015-05-31 ENCOUNTER — Ambulatory Visit: Payer: Medicare Other | Admitting: Physical Therapy

## 2015-05-31 ENCOUNTER — Other Ambulatory Visit: Payer: Self-pay | Admitting: Plastic Surgery

## 2015-05-31 DIAGNOSIS — T8589XA Other specified complication of internal prosthetic devices, implants and grafts, not elsewhere classified, initial encounter: Secondary | ICD-10-CM | POA: Diagnosis not present

## 2015-05-31 DIAGNOSIS — Z853 Personal history of malignant neoplasm of breast: Secondary | ICD-10-CM | POA: Diagnosis not present

## 2015-05-31 DIAGNOSIS — C50912 Malignant neoplasm of unspecified site of left female breast: Secondary | ICD-10-CM | POA: Diagnosis not present

## 2015-05-31 DIAGNOSIS — L98499 Non-pressure chronic ulcer of skin of other sites with unspecified severity: Secondary | ICD-10-CM | POA: Diagnosis not present

## 2015-05-31 DIAGNOSIS — S21002A Unspecified open wound of left breast, initial encounter: Secondary | ICD-10-CM | POA: Diagnosis not present

## 2015-06-16 NOTE — Assessment & Plan Note (Signed)
Left lumpectomy 01/04/2015: DCIS 3.3 cm, grade 2, ER 100%, PR 0%, margins were close underwent reexcision margins negative, repeat mammogram showed calcifications and biopsy showed DCIS  left mastectomy with immediate reconstruction 04/07/2015: DCIS with calcifications, ALH, LCIS, 0/2 lymph nodes negative, grade 2, tumor size 3 cm, margins 3 mm, ER 100%, PR 10% Tis N0 stage 0  Recommendation: 1. Adjuvant tamoxifen 20 mg daily 5 years started mid September 2016.  Tamoxifen Toxicities:  Return to clinic 3 months for follow up

## 2015-06-17 ENCOUNTER — Telehealth: Payer: Self-pay | Admitting: Hematology and Oncology

## 2015-06-17 ENCOUNTER — Ambulatory Visit (HOSPITAL_BASED_OUTPATIENT_CLINIC_OR_DEPARTMENT_OTHER): Payer: Medicare Other | Admitting: Hematology and Oncology

## 2015-06-17 ENCOUNTER — Encounter: Payer: Self-pay | Admitting: Hematology and Oncology

## 2015-06-17 VITALS — BP 134/64 | HR 57 | Temp 98.0°F | Resp 20 | Ht 63.0 in | Wt 190.1 lb

## 2015-06-17 DIAGNOSIS — Z170 Estrogen receptor positive status [ER+]: Secondary | ICD-10-CM | POA: Diagnosis not present

## 2015-06-17 DIAGNOSIS — Z79810 Long term (current) use of selective estrogen receptor modulators (SERMs): Secondary | ICD-10-CM

## 2015-06-17 DIAGNOSIS — D0512 Intraductal carcinoma in situ of left breast: Secondary | ICD-10-CM

## 2015-06-17 DIAGNOSIS — C50412 Malignant neoplasm of upper-outer quadrant of left female breast: Secondary | ICD-10-CM

## 2015-06-17 NOTE — Telephone Encounter (Signed)
Appointments made and avs printed for patient °

## 2015-06-17 NOTE — Progress Notes (Signed)
Patient Care Team: Levin Erp, MD as PCP - General (Internal Medicine)  DIAGNOSIS: Breast cancer of upper-outer quadrant of left female breast [facility])   Staging form: Breast, AJCC 7th Edition     Clinical: Stage 0 (Tis (DCIS), N0, M0) - Unsigned   SUMMARY OF ONCOLOGIC HISTORY:   Breast cancer of upper-outer quadrant of left female breast ([place])   12/13/2014 Breast MRI Left breast patchy diffuse multifocal enhancement with postbiopsy changes   01/04/2015 Surgery Left lumpectomy: DCIS 3.3 cm, grade 2, ER 100%, PR 0%, margins were close underwent reexcision margins close less than 1 mm   04/07/2015 Surgery left mastectomy: DCIS with calcifications, ALH, LCIS, 0/2 lymph nodes negative, grade 2, tumor size 3 cm, margins 3 mm, ER 100%, PR 10% Tis N0 stage 0   05/24/2015 -  Anti-estrogen oral therapy  tamoxifen 20 mg daily 5 years    CHIEF COMPLIANT:  Follow-up on tamoxifen  INTERVAL HISTORY: [name] is a  64 year old with above-mentioned history of left breast cancer currently on tamoxifen adjuvant therapy. She is currently undergoing breast reconstruction is extremely sore in the left breast and chest wall. She tolerating tamoxifen very well. She has occasional night sweats. Mild tiredness.  REVIEW OF SYSTEMS:   Constitutional: Denies fevers, chills or abnormal weight loss Eyes: Denies blurriness of vision Ears, nose, mouth, throat, and face: Denies mucositis or sore throat Respiratory: Denies cough, dyspnea or wheezes Cardiovascular: Denies palpitation, chest discomfort or lower extremity swelling Gastrointestinal:  Denies nausea, heartburn or change in bowel habits Skin: Denies abnormal skin rashes Lymphatics: Denies new lymphadenopathy or easy bruising Neurological:Denies numbness, tingling or new weaknesses Behavioral/Psych: Mood is stable, no new changes  Breast:  Soreness in the breast surgery and reconstruction All other systems were reviewed with the patient and are  negative.  I have reviewed the past medical history, past surgical history, social history and family history with the patient and they are unchanged from previous note.  ALLERGIES:  has No Known Allergies.  MEDICATIONS:  Current Outpatient Prescriptions  Medication Sig Dispense Refill  . Calcium Carbonate-Vitamin D (CALCIUM 600 + D PO) Take 2 tablets by mouth daily.      Marland Kitchen docusate sodium (COLACE) 100 MG capsule Take 1 capsule (100 mg total) by mouth daily. 10 capsule 0  . enoxaparin (LOVENOX) 40 MG/0.4ML injection Inject 0.4 mLs (40 mg total) into the skin daily. 12 Syringe 0  . esomeprazole (NEXIUM) 20 MG capsule Take 20 mg by mouth daily at 12 noon.     Marland Kitchen HYDROmorphone (DILAUDID) 2 MG tablet Take 1-2 tablets (2-4 mg total) by mouth every 4 (four) hours as needed for moderate pain. 50 tablet 0  . methocarbamol (ROBAXIN) 500 MG tablet Take 1 tablet (500 mg total) by mouth 4 (four) times daily. 40 tablet 1  . Multiple Vitamins-Minerals (CENTRUM SILVER PO) Take 1 tablet by mouth daily.     . tamoxifen (NOLVADEX) 20 MG tablet Take 1 tablet (20 mg total) by mouth daily. 90 tablet 3   No current facility-administered medications for this visit.    PHYSICAL EXAMINATION: ECOG PERFORMANCE STATUS: 1 - Symptomatic but completely ambulatory  Filed Vitals:   06/17/15 1100  BP: 134/64  Pulse: 57  Temp: 98 F (36.7 C)  Resp: 20   Filed Weights   06/17/15 1100  Weight: 190 lb 1.6 oz (86.229 kg)    GENERAL:alert, no distress and comfortable SKIN: skin color, texture, turgor are normal, no rashes or significant lesions EYES:  normal, Conjunctiva are pink and non-injected, sclera clear OROPHARYNX:no exudate, no erythema and lips, buccal mucosa, and tongue normal  NECK: supple, thyroid normal size, non-tender, without nodularity LYMPH:  no palpable lymphadenopathy in the cervical, axillary or inguinal LUNGS: clear to auscultation and percussion with normal breathing effort HEART: regular  rate & rhythm and no murmurs and no lower extremity edema ABDOMEN:abdomen soft, non-tender and normal bowel sounds Musculoskeletal:no cyanosis of digits and no clubbing  NEURO: alert & oriented x 3 with fluent speech, no focal motor/sensory deficits BREAST: No palpable masses or nodules in either right or left breasts. No palpable axillary supraclavicular or infraclavicular adenopathy no breast tenderness or nipple discharge. (exam performed in the presence of a chaperone)  LABORATORY DATA:  I have reviewed the data as listed   Chemistry      Component Value Date/Time   NA 141 04/01/2015 1429   K 3.9 04/01/2015 1429   CL 109 04/01/2015 1429   CO2 24 04/01/2015 1429   BUN 9 04/01/2015 1429   CREATININE 0.75 04/01/2015 1429      Component Value Date/Time   CALCIUM 9.4 04/01/2015 1429   ALKPHOS 92 04/01/2015 1429   AST 25 04/01/2015 1429   ALT 24 04/01/2015 1429   BILITOT 1.3* 04/01/2015 1429       Lab Results  Component Value Date   WBC 10.5 04/01/2015   HGB 14.0 04/01/2015   HCT 40.1 04/01/2015   MCV 91.3 04/01/2015   PLT 280 04/01/2015   NEUTROABS 5.0 04/01/2015   ASSESSMENT & PLAN:  Breast cancer of upper-outer quadrant of left female breast Left lumpectomy 01/04/2015: DCIS 3.3 cm, grade 2, ER 100%, PR 0%, margins were close underwent reexcision margins negative, repeat mammogram showed calcifications and biopsy showed DCIS  left mastectomy with immediate reconstruction 04/07/2015: DCIS with calcifications, ALH, LCIS, 0/2 lymph nodes negative, grade 2, tumor size 3 cm, margins 3 mm, ER 100%, PR 10% Tis N0 stage 0  Recommendation: 1. Adjuvant tamoxifen 20 mg daily 5 years started mid September 2016.  Tamoxifen Toxicities: very occasional night sweats otherwise doing well I encouraged her participation in the [place] and live strong program through [facility].  Return to clinic 6 months for follow up  No orders of the defined types were placed in this encounter.    The patient has a good understanding of the overall plan. she agrees with it. she will call with any problems that may develop before the next visit here.   Rulon Eisenmenger, MD 06/17/2015

## 2015-06-20 ENCOUNTER — Telehealth: Payer: Self-pay | Admitting: *Deleted

## 2015-06-20 DIAGNOSIS — C50412 Malignant neoplasm of upper-outer quadrant of left female breast: Secondary | ICD-10-CM

## 2015-06-20 NOTE — Telephone Encounter (Signed)
  Oncology Nurse Navigator Documentation    Navigator Encounter Type: Telephone (06/20/15 1400)         Interventions: Referrals (06/20/15 1400) Referrals: Survivorship (06/20/15 1400)          Time Spent with Patient: 15 (06/20/15 1400)

## 2015-06-21 ENCOUNTER — Telehealth: Payer: Self-pay | Admitting: Hematology and Oncology

## 2015-06-21 NOTE — Telephone Encounter (Signed)
Appointments declined for survivorship

## 2015-07-07 ENCOUNTER — Ambulatory Visit: Payer: Medicare Other | Attending: Plastic Surgery | Admitting: Physical Therapy

## 2015-07-07 DIAGNOSIS — M79602 Pain in left arm: Secondary | ICD-10-CM | POA: Insufficient documentation

## 2015-07-07 DIAGNOSIS — M25612 Stiffness of left shoulder, not elsewhere classified: Secondary | ICD-10-CM | POA: Insufficient documentation

## 2015-07-07 NOTE — Therapy (Signed)
[facility], [place], [address] Phone: [phone]   Fax:  [phone]  Physical Therapy Evaluation  Patient Details  Name: [name] MRN: [id number] Date of Birth: May 22, 1951 Referring Provider: Crissie Reese   Encounter Date: 07/07/2015      PT End of Session - 07/07/15 1731    Visit Number 1   Number of Visits 13   Date for PT Re-Evaluation 08/18/15   PT Start Time 1100   PT Stop Time 1145   PT Time Calculation (min) 45 min   Activity Tolerance Patient tolerated treatment well   Behavior During Therapy [facility] for tasks assessed/performed      Past Medical History  Diagnosis Date  . Anxiety   . GERD (gastroesophageal reflux disease)   . Ductal carcinoma in situ (DCIS) of left breast   12/08/14    Left Upper Outer Quadrant  . Heart murmur     baby  . Depression   . Chronic kidney disease     released from [facility] feb 16 states blood found in urine in past-tests done but could not find reason for blood  . Cancer (HCC)     breast  . PONV (postoperative nausea and vomiting)     with cataract surgery and ist lumpectomy req patch and iv med for nausea    Past Surgical History  Procedure Laterality Date  . Cataracts l eye Left   . Cataract extraction    . Dental surgery      rt upper tooth removed  and crown? placed  . Cholecystectomy  09/17/2011    Procedure: LAPAROSCOPIC CHOLECYSTECTOMY WITH INTRAOPERATIVE CHOLANGIOGRAM;  Surgeon: [name], MD;  Location: [facility];  Service: General;  Laterality: N/A;  carm   . Eye surgery    . Breast lumpectomy with radioactive seed localization Left 01/04/2015    Procedure: LEFT PARTIAL MASTECTOMY WITH RADIOACTIVE SEED LOCALIZATION;  Surgeon: Fanny Skates, MD;  Location: [facility];  Service: General;  Laterality: Left;  . Re-excision of breast lumpectomy Left 01/12/2015    Procedure: LEFT  BREAST LUMPECTOMY RE-EXCISION OF MARGINS;  Surgeon: Fanny Skates,  MD;  Location: [facility];  Service: General;  Laterality: Left;  . Left breast biopsy  12/08/14  . Mastectomy w/ sentinel node biopsy Left 04/07/2015    nipple sparing   . Nipple sparing mastectomy/sentinal lymph node biopsy/reconstruction/placement of tissue expander Left 04/07/2015    Procedure: LEFT NIPPLE SPARING MASTECTOMY WITH LEFT SENTINAL LYMPH NODE BIOPSY AND  RECONSTRUCTION WITH PLACEMENT OF TISSUE EXPANDER;  Surgeon: Fanny Skates, MD;  Location: [facility];  Service: General;  Laterality: Left;  . Breast reconstruction with placement of tissue expander and flex hd (acellular hydrated dermis) Left 04/07/2015    Procedure: IMMEDIATE LEFT BREAST RECONSTRUCTION WITH PLACEMENT SALINE IMPLANT AND CHEST WALL RECONSTRUCTION WITH  ACELLULAR DERMAL MATRIX ;  Surgeon: Crissie Reese, MD;  Location: [facility];  Service: Plastics;  Laterality: Left;    There were no vitals filed for this visit.  Visit Diagnosis:  Shoulder joint stiffness, left - Plan: PT plan of care cert/re-cert  Arm pain, left - Plan: PT plan of care cert/re-cert      Subjective Assessment - 07/07/15 1109    Subjective I 've had 4 surgeries and didn't start having severe problem with her arm until the last surgery.    Pertinent History left Breast cancer in March 2016, and had 2 lumpecetomies, then opted for mastectomy with  nipple sparing reconstruction on April 07, 2015 with another surgery for wound repair on Sept 27, 2016 She hopes she is finished with her surgeries.   She has had 2 lymph nodes removed She has not had or needs to have chemo or radiation therapy  During this process she has gained 60 pounds   Patient Stated Goals She needs to be able to maneuver her arm so that she can take care of herself and her home    Currently in Pain? Yes   Pain Score 9    Pain Location Arm   Pain Orientation Left;Upper;Lateral   Pain Descriptors / Indicators Aching;Sharp   Pain Radiating Towards radiates down lateral side of arm  to lateral 2 finger.    Pain Onset 1 to 4 weeks ago   Pain Frequency Intermittent   Aggravating Factors  movement up or high behind her back    Pain Relieving Factors nothing help,    Effect of Pain on Daily Activities limits daily activities and self care             [facility] PT Assessment - 07/07/15 0001    Assessment   Medical Diagnosis breast cancer   Referring Provider Crissie Reese    Onset Date/Surgical Date 11/17/14   Hand Dominance Right   Precautions   Precautions Other (comment)   Precaution Comments no massage pt has lifting precautions,   Restrictions   Weight Bearing Restrictions No   Balance Screen   Has the patient fallen in the past 6 months No   Has the patient had a decrease in activity level because of a fear of falling?  No   Is the patient reluctant to leave their home because of a fear of falling?  No   Home Environment   Living Environment Assisted living   Home Equipment None   Additional Comments takes care of her mother who needs occasional physical assist.  she has help from her daughter   Prior Function   Level of Independence Needs assistance with homemaking;Other (comment)  MD restrictions    Vocation Retired   Leisure likes to [place], wants to return to exericse and out and about activities    Cognition   Overall Cognitive Status Within Functional Limits for tasks assessed   Observation/Other Assessments   Observations left breast and upper chest appear to be full, with tight and shiny area at breast.  She has stitched healing area at lower breast area.    Skin Integrity appoximated with stitches at lower breast    Sensation   Additional Comments pt reports numbness along left lateral arm to fingers 4 and 5.  She states she had this previously after mastectomy from too much pressure at elbow, but then it went away and is now returned    Coordination   Gross Motor Movements are Fluid and Coordinated --  pt appears to be protective of  movement of left shoulder    Posture/Postural Control   Posture/Postural Control Postural limitations   Postural Limitations Rounded Shoulders;Forward head   Posture Comments assymetry of movement of scapula with shoulder shrugs and arm forward flexion.   AROM   Overall AROM Comments  cervical range of motion is within normal limits,  she has decreased scapular excursion on the left side with assymetry of movment.    Right Shoulder Flexion 162 Degrees   Right Shoulder ABduction 157 Degrees   Right Shoulder Internal Rotation 65 Degrees   Right Shoulder External  Rotation 84 Degrees   Left Shoulder Flexion 108 Degrees  mesured in supine    Left Shoulder ABduction 70 Degrees   Left Shoulder Internal Rotation 60 Degrees  measured in supine    Left Shoulder External Rotation 26 Degrees  meaured in supine    Strength   Overall Strength Comments left shoulder strength ~ 2/5 limited by pain            LYMPHEDEMA/ONCOLOGY QUESTIONNAIRE - 07/07/15 1146    Right Upper Extremity Lymphedema   10 cm Proximal to Olecranon Process 33.5 cm   Olecranon Process 26 cm   10 cm Proximal to Ulnar Styloid Process 22.4 cm   Just Proximal to Ulnar Styloid Process 15 cm   Across Hand at [employer] 18.9 cm   At [place] of 2nd Digit 5.5 cm   Left Upper Extremity Lymphedema   10 cm Proximal to Olecranon Process 33 cm   Olecranon Process 26.5 cm   10 cm Proximal to Ulnar Styloid Process 21.4 cm   Just Proximal to Ulnar Styloid Process 15 cm   Across Hand at [employer] 18.5 cm   At [place] of 2nd Digit 5.5 cm           Quick Dash - 07/07/15 0001    Open a tight or new jar Moderate difficulty   Do heavy household chores (wash walls, wash floors) Unable   Carry a shopping bag or briefcase No difficulty   Wash your back Unable   Use a knife to cut food No difficulty   Recreational activities in which you take some force or impact through your arm, shoulder, or hand (golf, hammering, tennis)  Unable   During the past week, to what extent has your arm, shoulder or hand problem interfered with your normal social activities with family, friends, neighbors, or groups? Modererately   During the past week, to what extent has your arm, shoulder or hand problem limited your work or other regular daily activities Extremely   Arm, shoulder, or hand pain. Severe   Tingling (pins and needles) in your arm, shoulder, or hand Moderate   Difficulty Sleeping No difficulty   DASH Score 56.82 %             [facility] Adult PT Treatment/Exercise - 07/07/15 0001    Shoulder Exercises: Supine   Other Supine Exercises supine dowel rod flexion                PT Education - 07/07/15 1730    Education provided Yes   Education Details supine dowel rod flexion   Person(s) Educated Patient   Methods Explanation;Demonstration;Handout   Comprehension Verbalized understanding           Short Term Clinic Goals - 07/07/15 1735    CC Short Term Goal  #1   Title Patient will be independent in a basic home exercise program ( 07/28/2015   Time 3   Period Weeks   Status New   CC Short Term Goal  #2   Title Patient will report a decrease in pain to a "6 "  so they can perform daily activities with greater ease   Time 3   Period Weeks   Status [facility] Goals - 07/07/15 1737    CC Long Term Goal  #1   Title Patient will improve left shoulder abduction to 120 degrees so that she  can get dressed with greater ease .   Time 6   Period Weeks   Status New   CC Long Term Goal  #2   Title Patient will report a decrease in pain  to a level 3  so they can perform daily activities with greater ease   Time 6   Period Weeks   Status New   CC Long Term Goal  #3   Title Patient will decrease the DASH score to < 40   to demonstrate increased functional use of upper extremity   Time 6   Period Weeks   Status New            Plan - 08/04/15 1712    Clinical  Impression Statement 64 yo female with multiple left breast surgeries including reconstruction with limited left shoulder range of motion, especially in external rotation.She has much pain and limited strength due to pain. She also has delayed healing at breast site with soft tissue involvement that will not be addressed.    Pt will benefit from skilled therapeutic intervention in order to improve on the following deficits Decreased range of motion;Pain;Decreased skin integrity;Increased edema;Impaired UE functional use;Impaired perceived functional ability;Decreased strength;Decreased endurance   Rehab Potential Good   Clinical Impairments Affecting Rehab Potential Restrictions include "no massage" and pt has activity restrictions   PT Frequency 2x / week   PT Duration 6 weeks   PT Treatment/Interventions ADLs/Self Care Home Management;Passive range of motion;Therapeutic exercise;Therapeutic activities   PT Next Visit Plan  try Codman exercie, UE ranger in multiple planes  elbow, hand , neck range motion with deep breathing. Review supine cane exercise.  do supine external rotation A/P ROM    Consulted and Agree with Plan of Care Patient          G-Codes - August 04, 2015 1728    Functional Assessment Tool Used DASH   Functional Limitation Carrying, moving and handling objects   Carrying, Moving and Handling Objects Current Status [phone]) At least 40 percent but less than 60 percent impaired, limited or restricted   Carrying, Moving and Handling Objects Goal Status ([id number]) At least 1 percent but less than 20 percent impaired, limited or restricted       Problem List Patient Active Problem List   Diagnosis Date Noted  . Breast cancer, left breast ([employer]) 04/07/2015  . Breast cancer of upper-outer quadrant of left female breast ([place]) 01/04/2015  . Gall stones 08/14/2011  . GERD (gastroesophageal reflux disease) 08/14/2011  . Abdominal gas pain 08/14/2011  . Anxiety disorder 08/14/2011   Donato Heinz. Owens Shark, [place]  04-Aug-2015, 5:40 PM  [facility] [place], [place], [address] Phone: [phone]   Fax:  [phone]  Name: [name] MRN: [id number] Date of Birth: 1950/11/19

## 2015-07-07 NOTE — Patient Instructions (Signed)
Cane Overhead - Supine  Hold cane at thighs with both hands, extend arms straight over head. Hold _1-2__ seconds. Repeat _5__ times. Do _3__ times per day.     

## 2015-07-12 ENCOUNTER — Ambulatory Visit: Payer: Medicare Other

## 2015-07-12 DIAGNOSIS — Z8669 Personal history of other diseases of the nervous system and sense organs: Secondary | ICD-10-CM | POA: Diagnosis not present

## 2015-07-12 DIAGNOSIS — M25612 Stiffness of left shoulder, not elsewhere classified: Secondary | ICD-10-CM | POA: Diagnosis not present

## 2015-07-12 DIAGNOSIS — Z87891 Personal history of nicotine dependence: Secondary | ICD-10-CM | POA: Diagnosis not present

## 2015-07-12 DIAGNOSIS — K219 Gastro-esophageal reflux disease without esophagitis: Secondary | ICD-10-CM | POA: Diagnosis not present

## 2015-07-12 DIAGNOSIS — F419 Anxiety disorder, unspecified: Secondary | ICD-10-CM | POA: Diagnosis not present

## 2015-07-12 DIAGNOSIS — Z9049 Acquired absence of other specified parts of digestive tract: Secondary | ICD-10-CM | POA: Diagnosis not present

## 2015-07-12 DIAGNOSIS — M79602 Pain in left arm: Secondary | ICD-10-CM

## 2015-07-12 DIAGNOSIS — D0512 Intraductal carcinoma in situ of left breast: Secondary | ICD-10-CM | POA: Diagnosis not present

## 2015-07-12 NOTE — Therapy (Signed)
[facility] [place], [place], [address] Phone: [phone]   Fax:  [phone]  Physical Therapy Treatment  Patient Details  Name: [name] MRN: [id number] Date of Birth: 1951-04-29 Referring Provider: Crissie Reese   Encounter Date: 07/12/2015      PT End of Session - 07/12/15 1016    Visit Number 2   Number of Visits 13   Date for PT Re-Evaluation 08/18/15   PT Start Time 0935   PT Stop Time 1017   PT Time Calculation (min) 42 min   Activity Tolerance Patient tolerated treatment well;Patient limited by pain   Behavior During Therapy [facility] for tasks assessed/performed      Past Medical History  Diagnosis Date  . Anxiety   . GERD (gastroesophageal reflux disease)   . Ductal carcinoma in situ (DCIS) of left breast   12/08/14    Left Upper Outer Quadrant  . Heart murmur     baby  . Depression   . Chronic kidney disease     released from [facility] dr. feb 16 states blood found in urine in past-tests done but could not find reason for blood  . Cancer (HCC)     breast  . PONV (postoperative nausea and vomiting)     with cataract surgery and ist lumpectomy req patch and iv med for nausea    Past Surgical History  Procedure Laterality Date  . Cataracts l eye Left   . Cataract extraction    . Dental surgery      rt upper tooth removed  and crown? placed  . Cholecystectomy  09/17/2011    Procedure: LAPAROSCOPIC CHOLECYSTECTOMY WITH INTRAOPERATIVE CHOLANGIOGRAM;  Surgeon: [name], MD;  Location: [facility];  Service: General;  Laterality: N/A;  carm   . Eye surgery    . Breast lumpectomy with radioactive seed localization Left 01/04/2015    Procedure: LEFT PARTIAL MASTECTOMY WITH RADIOACTIVE SEED LOCALIZATION;  Surgeon: Fanny Skates, MD;  Location: [facility];  Service: General;  Laterality: Left;  . Re-excision of breast lumpectomy Left 01/12/2015    Procedure: LEFT  BREAST LUMPECTOMY RE-EXCISION OF MARGINS;   Surgeon: Fanny Skates, MD;  Location: [facility];  Service: General;  Laterality: Left;  . Left breast biopsy  12/08/14  . Mastectomy w/ sentinel node biopsy Left 04/07/2015    nipple sparing   . Nipple sparing mastectomy/sentinal lymph node biopsy/reconstruction/placement of tissue expander Left 04/07/2015    Procedure: LEFT NIPPLE SPARING MASTECTOMY WITH LEFT SENTINAL LYMPH NODE BIOPSY AND  RECONSTRUCTION WITH PLACEMENT OF TISSUE EXPANDER;  Surgeon: Fanny Skates, MD;  Location: [facility];  Service: General;  Laterality: Left;  . Breast reconstruction with placement of tissue expander and flex hd (acellular hydrated dermis) Left 04/07/2015    Procedure: IMMEDIATE LEFT BREAST RECONSTRUCTION WITH PLACEMENT SALINE IMPLANT AND CHEST WALL RECONSTRUCTION WITH  ACELLULAR DERMAL MATRIX ;  Surgeon: Crissie Reese, MD;  Location: [facility];  Service: Plastics;  Laterality: Left;    There were no vitals filed for this visit.  Visit Diagnosis:  Shoulder joint stiffness, left  Arm pain, left      Subjective Assessment - 07/12/15 0939    Subjective Doing okay, been doing the exercises she gave me last time and they have been helping. I feel tight when I start them, but after 4 or 5 reps I feel looser. I still have a small opening in my incision that I'm seeing the Dr. Harlow Mares about on  Monday. Not having any pain, just my regular tightness.    Currently in Pain? No/denies                         [facility] Adult PT Treatment/Exercise - 07/12/15 0001    Shoulder Exercises: Stretch   External Rotation Stretch Other (comment)  10 reps, 5 second holds with UE ranger   Wall Stretch - Flexion Other (comment)  10 reps, 5 second holds with UE ranger   Other Shoulder Stretches Codman Pendulum exercises leaning on counter and seated EOB leaning on leg with Lt arm for Rt scapular retraction and protraction reaching towards floor; pt had trouble relaxing for Codmans   Manual Therapy   Passive ROM  In Supine: Rt shoulder flexion and external rotation just to pts tolerance.                    Short Term Clinic Goals - 07/07/15 1735    CC Short Term Goal  #1   Title Patient will be independent in a basic home exercise program ( 07/28/2015   Time 3   Period Weeks   Status New   CC Short Term Goal  #2   Title Patient will report a decrease in pain to a "6 "  so they can perform daily activities with greater ease   Time 3   Period Weeks   Status [facility] Goals - 07/07/15 1737    CC Long Term Goal  #1   Title Patient will improve left shoulder abduction to 120 degrees so that she can get dressed with greater ease .   Time 6   Period Weeks   Status New   CC Long Term Goal  #2   Title Patient will report a decrease in pain  to a level 3  so they can perform daily activities with greater ease   Time 6   Period Weeks   Status New   CC Long Term Goal  #3   Title Patient will decrease the DASH score to < 40   to demonstrate increased functional use of upper extremity   Time 6   Period Weeks   Status New            Plan - 07/12/15 1017    Clinical Impression Statement Pt overall tolerated treatment well though was limited by pain at her end ROM's with AAROM and PROM. Started having trouble relaxing the longer we stretched so ended with the Pendulum exercises which pt will cont trying at home.    Pt will benefit from skilled therapeutic intervention in order to improve on the following deficits Decreased range of motion;Pain;Decreased skin integrity;Increased edema;Impaired UE functional use;Impaired perceived functional ability;Decreased strength;Decreased endurance   Rehab Potential Good   Clinical Impairments Affecting Rehab Potential Restrictions include "no massage" and pt has activity restrictions   PT Frequency 2x / week   PT Duration 6 weeks   PT Treatment/Interventions ADLs/Self Care Home Management;Passive range of  motion;Therapeutic exercise;Therapeutic activities   PT Next Visit Plan Cont Codman exercise/review with pt; cont UE ranger; add neck ROM stretching with deep breathing. Review cane exercisees prn, cont manual stretching per MD orders.    PT Home Exercise Plan Codman pendulum exercises   Consulted and Agree with Plan of Care Patient        Problem List Patient Active  Problem List   Diagnosis Date Noted  . Breast cancer, left breast ([place]) 04/07/2015  . Breast cancer of upper-outer quadrant of left female breast ([place]) 01/04/2015  . Gall stones 08/14/2011  . GERD (gastroesophageal reflux disease) 08/14/2011  . Abdominal gas pain 08/14/2011  . Anxiety disorder 08/14/2011    Otelia Limes, PTA 07/12/2015, 10:23 AM  [place], [place], [address] Phone: [phone]   Fax:  [phone]  Name: [name] MRN: [id number] Date of Birth: [birth year]

## 2015-07-14 ENCOUNTER — Encounter: Payer: Self-pay | Admitting: Physical Therapy

## 2015-07-14 ENCOUNTER — Ambulatory Visit: Payer: Medicare Other | Admitting: Physical Therapy

## 2015-07-14 DIAGNOSIS — M79602 Pain in left arm: Secondary | ICD-10-CM

## 2015-07-14 DIAGNOSIS — M25612 Stiffness of left shoulder, not elsewhere classified: Secondary | ICD-10-CM | POA: Diagnosis not present

## 2015-07-14 NOTE — Patient Instructions (Signed)
Extension (Isometric)    Place left bent elbow and back of arm against wall. Press elbow against wall. Hold _5_ seconds. Repeat _5___ times. Do __2__ sessions per day.  http://gt2.exer.us/112   Copyright  VHI. All rights reserved.  Flexion (Isometric)    Press right fist against wall. Hold _5___ seconds. Repeat __5__ times. Do __2__ sessions per day.  http://gt2.exer.us/114   Copyright  VHI. All rights reserved.  SHOULDER: Abduction (Isometric)    Use wall as resistance. Press arm against pillow. Keep elbow straight. Hold _5__ seconds. _5__ reps per set, 2__ days per week  Copyright  VHI. All rights reserved.

## 2015-07-14 NOTE — Therapy (Signed)
[place], [place], [address] Phone: [phone]   Fax:  [phone]  Physical Therapy Treatment  Patient Details  Name: [name] MRN: [id number] Date of Birth: 11-15-1950 Referring Provider: Crissie Reese   Encounter Date: 07/14/2015      PT End of Session - 07/14/15 0935    Visit Number 3   Number of Visits 13   Date for PT Re-Evaluation 08/18/15   PT Start Time 0845   PT Stop Time 0932   PT Time Calculation (min) 47 min   Activity Tolerance Patient tolerated treatment well;Patient limited by pain   Behavior During Therapy [facility] for tasks assessed/performed      Past Medical History  Diagnosis Date  . Anxiety   . GERD (gastroesophageal reflux disease)   . Ductal carcinoma in situ (DCIS) of left breast   12/08/14    Left Upper Outer Quadrant  . Heart murmur     baby  . Depression   . Chronic kidney disease     released from [facility] dr[facility] feb 16 states blood found in urine in past-tests done but could not find reason for blood  . Cancer (HCC)     breast  . PONV (postoperative nausea and vomiting)     with cataract surgery and ist lumpectomy req patch and iv med for nausea    Past Surgical History  Procedure Laterality Date  . Cataracts l eye Left   . Cataract extraction    . Dental surgery      rt upper tooth removed  and crown? placed  . Cholecystectomy  09/17/2011    Procedure: LAPAROSCOPIC CHOLECYSTECTOMY WITH INTRAOPERATIVE CHOLANGIOGRAM;  Surgeon: [name], MD;  Location: [place];  Service: General;  Laterality: N/A;  carm   . Eye surgery    . Breast lumpectomy with radioactive seed localization Left 01/04/2015    Procedure: LEFT PARTIAL MASTECTOMY WITH RADIOACTIVE SEED LOCALIZATION;  Surgeon: Fanny Skates, MD;  Location: [place];  Service: General;  Laterality: Left;  . Re-excision of breast lumpectomy Left 01/12/2015    Procedure: LEFT  BREAST LUMPECTOMY RE-EXCISION OF MARGINS;   Surgeon: Fanny Skates, MD;  Location: [place];  Service: General;  Laterality: Left;  . Left breast biopsy  12/08/14  . Mastectomy w/ sentinel node biopsy Left 04/07/2015    nipple sparing   . Nipple sparing mastectomy/sentinal lymph node biopsy/reconstruction/placement of tissue expander Left 04/07/2015    Procedure: LEFT NIPPLE SPARING MASTECTOMY WITH LEFT SENTINAL LYMPH NODE BIOPSY AND  RECONSTRUCTION WITH PLACEMENT OF TISSUE EXPANDER;  Surgeon: Fanny Skates, MD;  Location: [place];  Service: General;  Laterality: Left;  . Breast reconstruction with placement of tissue expander and flex hd (acellular hydrated dermis) Left 04/07/2015    Procedure: IMMEDIATE LEFT BREAST RECONSTRUCTION WITH PLACEMENT SALINE IMPLANT AND CHEST WALL RECONSTRUCTION WITH  ACELLULAR DERMAL MATRIX ;  Surgeon: Crissie Reese, MD;  Location: [place];  Service: Plastics;  Laterality: Left;    There were no vitals filed for this visit.  Visit Diagnosis:  Shoulder joint stiffness, left  Arm pain, left      Subjective Assessment - 07/14/15 0849    Subjective I'm seeing Dr. Veverly Fells an orthopedist 07/20/15 because Dr. Dalbert Batman wants me to see him.  I saw Dr. Dalbert Batman this week and I'm doing ok.  My wound is still a small opening and I see Dr. Harlow Mares 07/18/15.   Pertinent History left Breast cancer in March  2016, and had 2 lumpecetomies, then opted for mastectomy with nipple sparing reconstruction on April 07, 2015 with another surgery for wound repair on Sept 27, 2016 She hopes she is finished with her surgeries.   She has had 2 lymph nodes removed She has not had or needs to have chemo or radiation therapy  During this process she has gained 60 pounds   Patient Stated Goals She needs to be able to maneuver her arm so that she can take care of herself and her home    Pain Score 9    Pain Location Shoulder   Pain Orientation Left   Pain Descriptors / Indicators Sharp   Pain Type Acute pain   Pain Onset More than a  month ago   Pain Frequency Intermittent                         [facility] Adult PT Treatment/Exercise - 07/14/15 0001    Shoulder Exercises: Pulleys   Flexion 3 minutes   ABduction 1 minute   ABduction Limitations Stopped due to pain   Shoulder Exercises: Therapy Ball   Flexion 10 reps   Shoulder Exercises: Isometric Strengthening   Flexion 5X5"   Extension 5X5"   ABduction 5X5"   ABduction Limitations All isometrics after PT demo with verbal instruction for peoper technique; no c/o pain   Manual Therapy   Manual Therapy Soft tissue mobilization   Soft tissue mobilization Soft tissue work to left lateral deltoid with biofreeze in supine with left UE supported on towel roll   Passive ROM PROM to left shoulder all planes to pt tolerance                PT Education - 07/14/15 0902    Education provided Yes   Education Details shoulder isometrics   Person(s) Educated Patient   Methods Explanation;Demonstration;Handout   Comprehension Verbalized understanding;Returned demonstration           Short Term Clinic Goals - 07/07/15 1735    CC Short Term Goal  #1   Title Patient will be independent in a basic home exercise program ( 07/28/2015   Time 3   Period Weeks   Status New   CC Short Term Goal  #2   Title Patient will report a decrease in pain to a "6 "  so they can perform daily activities with greater ease   Time 3   Period [facility] Goals - 07/07/15 1737    CC Long Term Goal  #1   Title Patient will improve left shoulder abduction to 120 degrees so that she can get dressed with greater ease .   Time 6   Period Weeks   Status New   CC Long Term Goal  #2   Title Patient will report a decrease in pain  to a level 3  so they can perform daily activities with greater ease   Time 6   Period Weeks   Status New   CC Long Term Goal  #3   Title Patient will decrease the DASH score to < 40   to demonstrate  increased functional use of upper extremity   Time 6   Period Weeks   Status New            Plan - 07/14/15 0936    Clinical Impression Statement She  continues to be very limited by pain but made good progress throughout PROM after biofreeze was applied and gained possibly 20 degrees of abduction.  She sees an orthopedist next week so plan may be altered based on his recommendations.     Pt will benefit from skilled therapeutic intervention in order to improve on the following deficits Decreased range of motion;Pain;Decreased skin integrity;Increased edema;Impaired UE functional use;Impaired perceived functional ability;Decreased strength;Decreased endurance   Rehab Potential Good   Clinical Impairments Affecting Rehab Potential Restrictions include "no massage" and pt has activity restrictions   PT Frequency 2x / week   PT Duration 6 weeks   PT Treatment/Interventions ADLs/Self Care Home Management;Passive range of motion;Therapeutic exercise;Therapeutic activities   PT Next Visit Plan Continue PROM as tolerated, gentle soft tissue work to lateral deltoid; AAROM exercises; assess response to isometrics   Consulted and Agree with Plan of Care Patient        Problem List Patient Active Problem List   Diagnosis Date Noted  . Breast cancer, left breast ([place]) 04/07/2015  . Breast cancer of upper-outer quadrant of left female breast ([place]) 01/04/2015  . Gall stones 08/14/2011  . GERD (gastroesophageal reflux disease) 08/14/2011  . Abdominal gas pain 08/14/2011  . Anxiety disorder 08/14/2011    Annia Friendly, PT 07/14/2015 9:44 AM   [place], [place], [address] Phone: [phone]   Fax:  [phone]  Name: [name] MRN: [id number] Date of Birth: Jan 29, 1951

## 2015-07-19 ENCOUNTER — Ambulatory Visit: Payer: Medicare Other

## 2015-07-19 DIAGNOSIS — M25612 Stiffness of left shoulder, not elsewhere classified: Secondary | ICD-10-CM | POA: Diagnosis not present

## 2015-07-19 DIAGNOSIS — M79602 Pain in left arm: Secondary | ICD-10-CM | POA: Diagnosis not present

## 2015-07-19 NOTE — Therapy (Signed)
[place], [place], [address] Phone: [phone]   Fax:  [phone]  Physical Therapy Treatment  Patient Details  Name: [name] MRN: [id number] Date of Birth: 19-Mar-1951 Referring Provider: Crissie Reese   Encounter Date: 07/19/2015      PT End of Session - 07/19/15 1023    Visit Number 4   Number of Visits 13   Date for PT Re-Evaluation 08/18/15   PT Start Time 0940   PT Stop Time 1023   PT Time Calculation (min) 43 min   Activity Tolerance Patient tolerated treatment well   Behavior During Therapy [facility] for tasks assessed/performed      Past Medical History  Diagnosis Date  . Anxiety   . GERD (gastroesophageal reflux disease)   . Ductal carcinoma in situ (DCIS) of left breast   12/08/14    Left Upper Outer Quadrant  . Heart murmur     baby  . Depression   . Chronic kidney disease     released from [facility] dr[facility] feb 16 states blood found in urine in past-tests done but could not find reason for blood  . Cancer (HCC)     breast  . PONV (postoperative nausea and vomiting)     with cataract surgery and ist lumpectomy req patch and iv med for nausea    Past Surgical History  Procedure Laterality Date  . Cataracts l eye Left   . Cataract extraction    . Dental surgery      rt upper tooth removed  and crown? placed  . Cholecystectomy  09/17/2011    Procedure: LAPAROSCOPIC CHOLECYSTECTOMY WITH INTRAOPERATIVE CHOLANGIOGRAM;  Surgeon: [name], MD;  Location: [place];  Service: General;  Laterality: N/A;  carm   . Eye surgery    . Breast lumpectomy with radioactive seed localization Left 01/04/2015    Procedure: LEFT PARTIAL MASTECTOMY WITH RADIOACTIVE SEED LOCALIZATION;  Surgeon: Fanny Skates, MD;  Location: [place];  Service: General;  Laterality: Left;  . Re-excision of breast lumpectomy Left 01/12/2015    Procedure: LEFT  BREAST LUMPECTOMY RE-EXCISION OF MARGINS;  Surgeon: Fanny Skates,  MD;  Location: [place];  Service: General;  Laterality: Left;  . Left breast biopsy  12/08/14  . Mastectomy w/ sentinel node biopsy Left 04/07/2015    nipple sparing   . Nipple sparing mastectomy/sentinal lymph node biopsy/reconstruction/placement of tissue expander Left 04/07/2015    Procedure: LEFT NIPPLE SPARING MASTECTOMY WITH LEFT SENTINAL LYMPH NODE BIOPSY AND  RECONSTRUCTION WITH PLACEMENT OF TISSUE EXPANDER;  Surgeon: Fanny Skates, MD;  Location: [place];  Service: General;  Laterality: Left;  . Breast reconstruction with placement of tissue expander and flex hd (acellular hydrated dermis) Left 04/07/2015    Procedure: IMMEDIATE LEFT BREAST RECONSTRUCTION WITH PLACEMENT SALINE IMPLANT AND CHEST WALL RECONSTRUCTION WITH  ACELLULAR DERMAL MATRIX ;  Surgeon: Crissie Reese, MD;  Location: [place];  Service: Plastics;  Laterality: Left;    There were no vitals filed for this visit.  Visit Diagnosis:  Shoulder joint stiffness, left  Arm pain, left      Subjective Assessment - 07/19/15 0945    Subjective That massage she gave me last time really helped, I feel like my ROM is even better now than when I left last week. I saw Dr. Harlow Mares yesterday and he removed my stistches and cleared me for walking and slowly getting back into my house cleaning, slowly!. I see the orthopedist  tomorrow. He is going to start me on a yeast medication he said bc of all the antibiotics I was on.   Currently in Pain? No/denies            [facility] PT Assessment - 07/19/15 0001    AROM   Left Shoulder Flexion 131 Degrees  In sitting after manual therapy   Left Shoulder ABduction 108 Degrees  No pain at end ranges, just tight in sitting   Left Shoulder Internal Rotation 54 Degrees  In sitting with UE in 90 degrees abduction   Left Shoulder External Rotation 59 Degrees  In sitting with UE in 90 degrees abduction                     [facility] Adult PT Treatment/Exercise - 07/19/15 0001     Shoulder Exercises: Pulleys   Flexion 2 minutes   ABduction 2 minutes   ABduction Limitations No Pain with this today and had increased ROM.   Shoulder Exercises: Therapy Ball   Flexion 10 reps   Manual Therapy   Soft tissue mobilization Soft tissue work to left lateral deltoid with biofreeze in supine with left UE supported on towel roll   Passive ROM PROM to left shoulder all planes to pt tolerance                   Short Term Clinic Goals - 07/19/15 1025    CC Short Term Goal  #1   Title Patient will be independent in a basic home exercise program ( 07/28/2015   CC Short Term Goal  #2   Title Patient will report a decrease in pain to a "6 "  so they can perform daily activities with greater ease   Status Achieved             [place] [facility] Goals - 07/19/15 1025    CC Long Term Goal  #1   Title Patient will improve left shoulder abduction to 120 degrees so that she can get dressed with greater ease .  108 degrees attained thus far 07/19/15   Status On-going   CC Long Term Goal  #2   Title Patient will report a decrease in pain  to a level 3  so they can perform daily activities with greater ease  Only hurts with certain end range reaching now   Status On-going   CC Long Term Goal  #3   Title Patient will decrease the DASH score to < 40   to demonstrate increased functional use of upper extremity   Status On-going            Plan - 07/19/15 1023    Clinical Impression Statement Pt did have c/o pain today with stretching reporting the massage last time really helped and her AROM measurements have all improved except IR but was able to measure this in sittin gtoday and was only a 6 degree difference. Pt also with no pain at end ROMs any longer. She is progressing very well. Sees the orthopedist today for her shoulder.    Pt will benefit from skilled therapeutic intervention in order to improve on the following deficits Decreased range of  motion;Pain;Decreased skin integrity;Increased edema;Impaired UE functional use;Impaired perceived functional ability;Decreased strength;Decreased endurance   Rehab Potential Good   Clinical Impairments Affecting Rehab Potential Restrictions include "no massage" and pt has activity restrictions   PT Frequency 2x / week   PT Duration 6 weeks   PT  Treatment/Interventions ADLs/Self Care Home Management;Passive range of motion;Therapeutic exercise;Therapeutic activities   PT Next Visit Plan Continue PROM as tolerated, gentle soft tissue work to lateral deltoid; AAROM exercises; assess response to isometrics   Consulted and Agree with Plan of Care Patient        Problem List Patient Active Problem List   Diagnosis Date Noted  . Breast cancer, left breast ([place]) 04/07/2015  . Breast cancer of upper-outer quadrant of left female breast ([place]) 01/04/2015  . Gall stones 08/14/2011  . GERD (gastroesophageal reflux disease) 08/14/2011  . Abdominal gas pain 08/14/2011  . Anxiety disorder 08/14/2011    Otelia Limes, PTA 07/19/2015, 10:27 AM  [place], [place], [address] Phone: [phone]   Fax:  [phone]  Name: JOUD BUSTILLO MRN: [id number] Date of Birth: 1951-08-31

## 2015-07-20 DIAGNOSIS — M7502 Adhesive capsulitis of left shoulder: Secondary | ICD-10-CM | POA: Diagnosis not present

## 2015-07-21 ENCOUNTER — Ambulatory Visit: Payer: Medicare Other

## 2015-07-21 DIAGNOSIS — M79602 Pain in left arm: Secondary | ICD-10-CM | POA: Diagnosis not present

## 2015-07-21 DIAGNOSIS — M25612 Stiffness of left shoulder, not elsewhere classified: Secondary | ICD-10-CM | POA: Diagnosis not present

## 2015-07-21 NOTE — Therapy (Signed)
[place], [place], [address] Phone: [phone]   Fax:  [phone]  Physical Therapy Treatment  Patient Details  Name: [name] MRN: [id number] Date of Birth: [birth year] Referring Provider: Crissie Reese   Encounter Date: 07/21/2015      PT End of Session - 07/21/15 1033    Visit Number 5   Number of Visits 13   Date for PT Re-Evaluation 08/18/15   PT Start Time 0940   PT Stop Time 1020   PT Time Calculation (min) 40 min   Activity Tolerance Patient tolerated treatment well   Behavior During Therapy [facility] for tasks assessed/performed      Past Medical History  Diagnosis Date  . Anxiety   . GERD (gastroesophageal reflux disease)   . Ductal carcinoma in situ (DCIS) of left breast   12/08/14    Left Upper Outer Quadrant  . Heart murmur     baby  . Depression   . Chronic kidney disease     released from [facility] dr. feb 16 states blood found in urine in past-tests done but could not find reason for blood  . Cancer (HCC)     breast  . PONV (postoperative nausea and vomiting)     with cataract surgery and ist lumpectomy req patch and iv med for nausea    Past Surgical History  Procedure Laterality Date  . Cataracts l eye Left   . Cataract extraction    . Dental surgery      rt upper tooth removed  and crown? placed  . Cholecystectomy  09/17/2011    Procedure: LAPAROSCOPIC CHOLECYSTECTOMY WITH INTRAOPERATIVE CHOLANGIOGRAM;  Surgeon: [name], MD;  Location: [place];  Service: General;  Laterality: N/A;  carm   . Eye surgery    . Breast lumpectomy with radioactive seed localization Left 01/04/2015    Procedure: LEFT PARTIAL MASTECTOMY WITH RADIOACTIVE SEED LOCALIZATION;  Surgeon: Fanny Skates, MD;  Location: [place];  Service: General;  Laterality: Left;  . Re-excision of breast lumpectomy Left 01/12/2015    Procedure: LEFT  BREAST LUMPECTOMY RE-EXCISION OF MARGINS;  Surgeon: Fanny Skates,  MD;  Location: [place];  Service: General;  Laterality: Left;  . Left breast biopsy  12/08/14  . Mastectomy w/ sentinel node biopsy Left 04/07/2015    nipple sparing   . Nipple sparing mastectomy/sentinal lymph node biopsy/reconstruction/placement of tissue expander Left 04/07/2015    Procedure: LEFT NIPPLE SPARING MASTECTOMY WITH LEFT SENTINAL LYMPH NODE BIOPSY AND  RECONSTRUCTION WITH PLACEMENT OF TISSUE EXPANDER;  Surgeon: Fanny Skates, MD;  Location: [place];  Service: General;  Laterality: Left;  . Breast reconstruction with placement of tissue expander and flex hd (acellular hydrated dermis) Left 04/07/2015    Procedure: IMMEDIATE LEFT BREAST RECONSTRUCTION WITH PLACEMENT SALINE IMPLANT AND CHEST WALL RECONSTRUCTION WITH  ACELLULAR DERMAL MATRIX ;  Surgeon: Crissie Reese, MD;  Location: [place];  Service: Plastics;  Laterality: Left;    There were no vitals filed for this visit.  Visit Diagnosis:  Shoulder joint stiffness, left  Arm pain, left      Subjective Assessment - 07/21/15 0943    Subjective Saw the orthopedist yesterday and he gave me a cortisone shot in my Lt shoulder and told me it was a frozen shoulder and could take up to a year to heal. I didn't like hearing htat but I feel like the shot has helped some. The massage you guys have been  doing has been helping the most alot too.    Currently in Pain? Yes   Pain Score 4    Pain Location Shoulder   Pain Orientation Left   Pain Descriptors / Indicators Aching   Pain Type Chronic pain   Pain Onset More than a month ago   Aggravating Factors  moving arm too quick, trying to reach high out to side   Pain Relieving Factors the massage has been helping and the shot the doctor gave me yesterday seems to have helped some too                         [facility] Adult PT Treatment/Exercise - 07/21/15 0001    Manual Therapy   Soft tissue mobilization Soft tissue to Lt uper arm only today, avoided deltoid area  ast his is where pt had a cortisone shot yesterday almost 24 hours ago, used biotone   Passive ROM PROM to left shoulder all planes to pt tolerance                   Short Term Clinic Goals - 07/19/15 1025    CC Short Term Goal  #1   Title Patient will be independent in a basic home exercise program ( 07/28/2015   CC Short Term Goal  #2   Title Patient will report a decrease in pain to a "6 "  so they can perform daily activities with greater ease   Status Achieved             [facility] Goals - 07/19/15 1025    CC Long Term Goal  #1   Title Patient will improve left shoulder abduction to 120 degrees so that she can get dressed with greater ease .  108 degrees attained thus far 07/19/15   Status On-going   CC Long Term Goal  #2   Title Patient will report a decrease in pain  to a level 3  so they can perform daily activities with greater ease  Only hurts with certain end range reaching now   Status On-going   CC Long Term Goal  #3   Title Patient will decrease the DASH score to < 40   to demonstrate increased functional use of upper extremity   Status On-going            Plan - 07/21/15 1033    Clinical Impression Statement Orthopedist doctor she saw yesterday diagnosed pt with frozen Lt shoulder and gave her a cortisone shot. She reports continuing to feel improvement since starting therapy and feels like the shot has already improved her pain a little more.    Pt will benefit from skilled therapeutic intervention in order to improve on the following deficits Decreased range of motion;Pain;Decreased skin integrity;Increased edema;Impaired UE functional use;Impaired perceived functional ability;Decreased strength;Decreased endurance   Rehab Potential Good   Clinical Impairments Affecting Rehab Potential Restrictions include "no massage" ; diagnosed with frozen shoulder of Lt UE 07/20/15   PT Frequency 2x / week   PT Duration 6 weeks   PT  Treatment/Interventions ADLs/Self Care Home Management;Passive range of motion;Therapeutic exercise;Therapeutic activities   PT Next Visit Plan Continue PROM as tolerated, gentle soft tissue work to lateral deltoid; AAROM exercises; assess response to isometrics and cont if okay   Consulted and Agree with Plan of Care Patient        Problem List Patient Active Problem List   Diagnosis Date  Noted  . Breast cancer, left breast ([place]) 04/07/2015  . Breast cancer of upper-outer quadrant of left female breast ([place]) 01/04/2015  . Gall stones 08/14/2011  . GERD (gastroesophageal reflux disease) 08/14/2011  . Abdominal gas pain 08/14/2011  . Anxiety disorder 08/14/2011    Otelia Limes, PTA 07/21/2015, 10:36 AM  [place], [place], [address] Phone: [phone]   Fax:  [phone]  Name: CAREL JAILLET MRN: [id number] Date of Birth: 12-28-1950

## 2015-07-25 ENCOUNTER — Ambulatory Visit: Payer: Medicare Other

## 2015-07-25 DIAGNOSIS — M79602 Pain in left arm: Secondary | ICD-10-CM | POA: Diagnosis not present

## 2015-07-25 DIAGNOSIS — M25612 Stiffness of left shoulder, not elsewhere classified: Secondary | ICD-10-CM | POA: Diagnosis not present

## 2015-07-25 NOTE — Therapy (Signed)
[place], [place], [address] Phone: [phone]   Fax:  [phone]  Physical Therapy Treatment  Patient Details  Name: [name] MRN: [id number] Date of Birth: 07-25-1951 Referring Provider: Crissie Reese   Encounter Date: 07/25/2015      PT End of Session - 07/25/15 1019    Visit Number 6   Number of Visits 13   Date for PT Re-Evaluation 08/18/15   PT Start Time 0935   PT Stop Time 1018   PT Time Calculation (min) 43 min   Activity Tolerance Patient tolerated treatment well   Behavior During Therapy [facility] for tasks assessed/performed      Past Medical History  Diagnosis Date  . Anxiety   . GERD (gastroesophageal reflux disease)   . Ductal carcinoma in situ (DCIS) of left breast   12/08/14    Left Upper Outer Quadrant  . Heart murmur     baby  . Depression   . Chronic kidney disease     released from [facility] dr[facility] feb 16 states blood found in urine in past-tests done but could not find reason for blood  . Cancer (HCC)     breast  . PONV (postoperative nausea and vomiting)     with cataract surgery and ist lumpectomy req patch and iv med for nausea    Past Surgical History  Procedure Laterality Date  . Cataracts l eye Left   . Cataract extraction    . Dental surgery      rt upper tooth removed  and crown? placed  . Cholecystectomy  09/17/2011    Procedure: LAPAROSCOPIC CHOLECYSTECTOMY WITH INTRAOPERATIVE CHOLANGIOGRAM;  Surgeon: [name], MD;  Location: [place];  Service: General;  Laterality: N/A;  carm   . Eye surgery    . Breast lumpectomy with radioactive seed localization Left 01/04/2015    Procedure: LEFT PARTIAL MASTECTOMY WITH RADIOACTIVE SEED LOCALIZATION;  Surgeon: Fanny Skates, MD;  Location: [place];  Service: General;  Laterality: Left;  . Re-excision of breast lumpectomy Left 01/12/2015    Procedure: LEFT  BREAST LUMPECTOMY RE-EXCISION OF MARGINS;  Surgeon: Fanny Skates,  MD;  Location: [place];  Service: General;  Laterality: Left;  . Left breast biopsy  12/08/14  . Mastectomy w/ sentinel node biopsy Left 04/07/2015    nipple sparing   . Nipple sparing mastectomy/sentinal lymph node biopsy/reconstruction/placement of tissue expander Left 04/07/2015    Procedure: LEFT NIPPLE SPARING MASTECTOMY WITH LEFT SENTINAL LYMPH NODE BIOPSY AND  RECONSTRUCTION WITH PLACEMENT OF TISSUE EXPANDER;  Surgeon: Fanny Skates, MD;  Location: [place];  Service: General;  Laterality: Left;  . Breast reconstruction with placement of tissue expander and flex hd (acellular hydrated dermis) Left 04/07/2015    Procedure: IMMEDIATE LEFT BREAST RECONSTRUCTION WITH PLACEMENT SALINE IMPLANT AND CHEST WALL RECONSTRUCTION WITH  ACELLULAR DERMAL MATRIX ;  Surgeon: Crissie Reese, MD;  Location: [place];  Service: Plastics;  Laterality: Left;    There were no vitals filed for this visit.  Visit Diagnosis:  Shoulder joint stiffness, left  Arm pain, left      Subjective Assessment - 07/25/15 0939    Subjective My Lt soulder feels even better this week than it was starting to last week! And my motion is so much better too. No pain at all right now, just really stiff but that's always worse in the morning.    Currently in Pain? No/denies  [facility] PT Assessment - 07/25/15 0001    AROM   Left Shoulder Flexion 141 Degrees   Left Shoulder ABduction 112 Degrees   Left Shoulder Internal Rotation 60 Degrees   Left Shoulder External Rotation 72 Degrees                     [facility] Adult PT Treatment/Exercise - 07/25/15 0001    Shoulder Exercises: Standing   External Rotation Strengthening;Left;10 reps;Theraband   Theraband Level (Shoulder External Rotation) Level 1 (Yellow)   Internal Rotation Strengthening;Left;Both;10 reps;Theraband   Theraband Level (Shoulder Internal Rotation) Level 1 (Yellow)   Flexion Strengthening;Left;10 reps;Theraband   Theraband Level  (Shoulder Flexion) Level 1 (Yellow)   Extension Strengthening;Left;10 reps;Theraband   Theraband Level (Shoulder Extension) Level 1 (Yellow)   Shoulder Exercises: Pulleys   Flexion 2 minutes   ABduction 2 minutes   Shoulder Exercises: Therapy Ball   Flexion 10 reps  With 3-5 second hold at top   Manual Therapy   Soft tissue mobilization Soft tissue to Lt uper arm only today, avoided deltoid area ast his is where pt had a cortisone shot yesterday almost 24 hours ago, used biotone   Passive ROM PROM to left shoulder all planes to pt tolerance                PT Education - 07/25/15 0951    Education provided Yes   Education Details [place] with yellow theraband   Person(s) Educated Patient   Methods Explanation;Demonstration;Handout   Comprehension Verbalized understanding;Returned demonstration           Short Term Clinic Goals - 07/25/15 1001    CC Short Term Goal  #1   Title Patient will be independent in a basic home exercise program ( 07/28/2015   Status Achieved   CC Short Term Goal  #2   Title Patient will report a decrease in pain to a "6 "  so they can perform daily activities with greater ease   Status Achieved             Long Term Clinic Goals - 07/25/15 1001    CC Long Term Goal  #1   Title Patient will improve left shoulder abduction to 120 degrees so that she can get dressed with greater ease .  112 degrees attained today 07/25/15   Status On-going   CC Long Term Goal  #2   Title Patient will report a decrease in pain  to a level 3  so they can perform daily activities with greater ease   Status Achieved   CC Long Term Goal  #3   Title Patient will decrease the DASH score to < 40   to demonstrate increased functional use of upper extremity   Status On-going            Plan - 07/25/15 1019    Clinical Impression Statement Pt comtinues to make great progress with her ROM showing increases each week. Her pain also continues to improve and  progressed her HEP to begin gentle strengthening with theraband every other day and cont stretching every day to tolerance.    Pt will benefit from skilled therapeutic intervention in order to improve on the following deficits Decreased range of motion;Pain;Decreased skin integrity;Increased edema;Impaired UE functional use;Impaired perceived functional ability;Decreased strength;Decreased endurance   Clinical Impairments Affecting Rehab Potential Restrictions include "no massage" ; diagnosed with frozen shoulder of Lt UE 07/20/15   PT Frequency 2x / week  PT Duration 6 weeks   PT Treatment/Interventions ADLs/Self Care Home Management;Passive range of motion;Therapeutic exercise;Therapeutic activities   PT Next Visit Plan Continue PROM as tolerated, gentle soft tissue work to lateral deltoid; AAROM exercises; review HEP prn   PT Home Exercise Plan [place] with yellow theraband q other day   Consulted and Agree with Plan of Care Patient        Problem List Patient Active Problem List   Diagnosis Date Noted  . Breast cancer, left breast ([place]) 04/07/2015  . Breast cancer of upper-outer quadrant of left female breast ([place]) 01/04/2015  . Gall stones 08/14/2011  . GERD (gastroesophageal reflux disease) 08/14/2011  . Abdominal gas pain 08/14/2011  . Anxiety disorder 08/14/2011    Otelia Limes, PTA  07/25/2015, 10:22 AM  [place], [place], [address] Phone: [phone]   Fax:  [phone]  Name: JOCEYLN CHASTAIN MRN: [id number] Date of Birth: [birth year]

## 2015-07-25 NOTE — Patient Instructions (Signed)
Cancer Rehab [phone] Strengthening: Resisted Internal Rotation   Hold tubing in left hand, elbow at side and forearm out. Rotate forearm in across body. Repeat __10__ times per set. Do __1__ sets per session. Do _1___ sessions every other day.  http://orth.exer.us/830   Copyright  VHI. All rights reserved.  Strengthening: Resisted External Rotation   Hold tubing in right hand, elbow at side and forearm across body. Rotate forearm out. Repeat _10___ times per set. Do __1__ sets per session. Do __1__ sessions every other day.  http://orth.exer.us/828   Copyright  VHI. All rights reserved.  Strengthening: Resisted Flexion   Hold tubing with left arm at side. Pull forward and up. Move shoulder through pain-free range of motion. Repeat _10___ times per set. Do __1__ sets per session. Do _1___ sessions every other day.  http://orth.exer.us/824   Copyright  VHI. All rights reserved.  Strengthening: Resisted Extension   Hold tubing in left hand, arm forward. Pull arm back, elbow straight. Repeat _10___ times per set. Do _1___ sets per session. Do __1__ sessions every other day.  http://orth.exer.us/832   Copyright  VHI. All rights reserved.

## 2015-08-02 ENCOUNTER — Ambulatory Visit: Payer: Medicare Other

## 2015-08-02 DIAGNOSIS — M79602 Pain in left arm: Secondary | ICD-10-CM

## 2015-08-02 DIAGNOSIS — M25612 Stiffness of left shoulder, not elsewhere classified: Secondary | ICD-10-CM

## 2015-08-02 NOTE — Therapy (Signed)
[name], [place], [address] Phone: [phone]   Fax:  [phone]  Physical Therapy Treatment  Patient Details  Name: [name] MRN: [id number] Date of Birth: 09/06/1950 Referring Provider: Crissie Sanchez   Encounter Date: 08/02/2015      PT End of Session - 08/02/15 1019    Visit Number 7   Number of Visits 13   Date for PT Re-Evaluation 08/18/15   PT Start Time 0937   PT Stop Time 1018   PT Time Calculation (min) 41 min   Activity Tolerance Patient tolerated treatment well   Behavior During Therapy [facility] for tasks assessed/performed      Past Medical History  Diagnosis Date  . Anxiety   . GERD (gastroesophageal reflux disease)   . Ductal carcinoma in situ (DCIS) of left breast   12/08/14    Left Upper Outer Quadrant  . Heart murmur     baby  . Depression   . Chronic kidney disease     released from [facility] dr[facility] feb 16 states blood found in urine in past-tests done but could not find reason for blood  . Cancer (HCC)     breast  . PONV (postoperative nausea and vomiting)     with cataract surgery and ist lumpectomy req patch and iv med for nausea    Past Surgical History  Procedure Laterality Date  . Cataracts l eye Left   . Cataract extraction    . Dental surgery      rt upper tooth removed  and crown? placed  . Cholecystectomy  09/17/2011    Procedure: LAPAROSCOPIC CHOLECYSTECTOMY WITH INTRAOPERATIVE CHOLANGIOGRAM;  Surgeon: Ana Hector, MD;  Location: [place];  Service: General;  Laterality: N/A;  carm   . Eye surgery    . Breast lumpectomy with radioactive seed localization Left 01/04/2015    Procedure: LEFT PARTIAL MASTECTOMY WITH RADIOACTIVE SEED LOCALIZATION;  Surgeon: Ana Skates, MD;  Location: [place];  Service: General;  Laterality: Left;  . Re-excision of breast lumpectomy Left 01/12/2015    Procedure: LEFT  BREAST LUMPECTOMY RE-EXCISION OF MARGINS;  Surgeon: Ana Skates,  MD;  Location: [place];  Service: General;  Laterality: Left;  . Left breast biopsy  12/08/14  . Mastectomy w/ sentinel node biopsy Left 04/07/2015    nipple sparing   . Nipple sparing mastectomy/sentinal lymph node biopsy/reconstruction/placement of tissue expander Left 04/07/2015    Procedure: LEFT NIPPLE SPARING MASTECTOMY WITH LEFT SENTINAL LYMPH NODE BIOPSY AND  RECONSTRUCTION WITH PLACEMENT OF TISSUE EXPANDER;  Surgeon: Ana Skates, MD;  Location: [place];  Service: General;  Laterality: Left;  . Breast reconstruction with placement of tissue expander and flex hd (acellular hydrated dermis) Left 04/07/2015    Procedure: IMMEDIATE LEFT BREAST RECONSTRUCTION WITH PLACEMENT SALINE IMPLANT AND CHEST WALL RECONSTRUCTION WITH  ACELLULAR DERMAL MATRIX ;  Surgeon: Ana Reese, MD;  Location: [place];  Service: Plastics;  Laterality: Left;    There were no vitals filed for this visit.  Visit Diagnosis:  Shoulder joint stiffness, left  Arm pain, left      Subjective Assessment - 08/02/15 0944    Subjective My shoulder feels good today, still have tightness in the morning though this has improved greatly. I was really sore after the theraband exercises last vist so I haven't done those again.    Currently in Pain? No/denies  [place] Adult PT Treatment/Exercise - 08/02/15 0001    Manual Therapy   Soft tissue mobilization Soft tissue to Lt shoulder/deltoid area briefly before and after PROM   Passive ROM PROM to left shoulder all planes to pt tolerance                   Short Term Clinic Goals - 07/25/15 1001    CC Short Term Goal  #1   Title Patient will be independent in a basic home exercise program ( 07/28/2015   Status Achieved   CC Short Term Goal  #2   Title Patient will report a decrease in pain to a "6 "  so they can perform daily activities with greater ease   Status Achieved             Long Term Clinic  Goals - 07/25/15 1001    CC Long Term Goal  #1   Title Patient will improve left shoulder abduction to 120 degrees so that she can get dressed with greater ease .  112 degrees attained today 07/25/15   Status On-going   CC Long Term Goal  #2   Title Patient will report a decrease in pain  to a level 3  so they can perform daily activities with greater ease   Status Achieved   CC Long Term Goal  #3   Title Patient will decrease the DASH score to < 40   to demonstrate increased functional use of upper extremity   Status On-going            Plan - 08/02/15 1019    Clinical Impression Statement Pt tolerated stretching very well today, her pain has greatly reduced to where she reports hardly feeling it any more. Only tightness at her end range now as well, no c/o pain. Pt to hold off a little onger on HEP and when she feels ready will start with only 5 reps each and increase as tolerated.    Pt will benefit from skilled therapeutic intervention in order to improve on the following deficits Decreased range of motion;Pain;Decreased skin integrity;Increased edema;Impaired UE functional use;Impaired perceived functional ability;Decreased strength;Decreased endurance   Rehab Potential Good   Clinical Impairments Affecting Rehab Potential Restrictions include "no massage" ; diagnosed with frozen shoulder of Lt UE 07/20/15   PT Frequency 2x / week   PT Duration 6 weeks   PT Treatment/Interventions ADLs/Self Care Home Management;Passive range of motion;Therapeutic exercise;Therapeutic activities   PT Next Visit Plan Measure ROM next visit for goal assess. Continue PROM and begin to progress to A/AAROM; review and progress HEP prn   Consulted and Agree with Plan of Care Patient        Problem List Patient Active Problem List   Diagnosis Date Noted  . Breast cancer, left breast ([place]) 04/07/2015  . Breast cancer of upper-outer quadrant of left female breast ([place]) 01/04/2015  . Gall stones  08/14/2011  . GERD (gastroesophageal reflux disease) 08/14/2011  . Abdominal gas pain 08/14/2011  . Anxiety disorder 08/14/2011    [name], PTA 08/02/2015, 10:23 AM  [place], [place], [address] Phone: [phone]   Fax:  [phone]  Name: [name] MRN: [id number] Date of Birth: 09-11-1950

## 2015-08-04 ENCOUNTER — Ambulatory Visit: Payer: Medicare Other | Attending: Plastic Surgery

## 2015-08-04 DIAGNOSIS — M25612 Stiffness of left shoulder, not elsewhere classified: Secondary | ICD-10-CM | POA: Diagnosis not present

## 2015-08-04 DIAGNOSIS — M79602 Pain in left arm: Secondary | ICD-10-CM | POA: Diagnosis not present

## 2015-08-04 NOTE — Therapy (Addendum)
[place], [place], [address] Phone: [phone]   Fax:  [phone]  Physical Therapy Treatment  Patient Details  Name: [name] MRN: [id number] Date of Birth: 01-Oct-1950 Referring Provider: Crissie Reese   Encounter Date: 08/04/2015      PT End of Session - 08/04/15 1043    Visit Number 8   Number of Visits 13   Date for PT Re-Evaluation 08/18/15   PT Start Time 0938   PT Stop Time 1022   PT Time Calculation (min) 44 min   Activity Tolerance Patient tolerated treatment well   Behavior During Therapy [facility] for tasks assessed/performed      Past Medical History  Diagnosis Date  . Anxiety   . GERD (gastroesophageal reflux disease)   . Ductal carcinoma in situ (DCIS) of left breast   12/08/14    Left Upper Outer Quadrant  . Heart murmur     baby  . Depression   . Chronic kidney disease     released from [facility] dr[facility] feb 16 states blood found in urine in past-tests done but could not find reason for blood  . Cancer (HCC)     breast  . PONV (postoperative nausea and vomiting)     with cataract surgery and ist lumpectomy req patch and iv med for nausea    Past Surgical History  Procedure Laterality Date  . Cataracts l eye Left   . Cataract extraction    . Dental surgery      rt upper tooth removed  and crown? placed  . Cholecystectomy  09/17/2011    Procedure: LAPAROSCOPIC CHOLECYSTECTOMY WITH INTRAOPERATIVE CHOLANGIOGRAM;  Surgeon: [name], MD;  Location: [place];  Service: General;  Laterality: N/A;  carm   . Eye surgery    . Breast lumpectomy with radioactive seed localization Left 01/04/2015    Procedure: LEFT PARTIAL MASTECTOMY WITH RADIOACTIVE SEED LOCALIZATION;  Surgeon: Fanny Skates, MD;  Location: [place];  Service: General;  Laterality: Left;  . Re-excision of breast lumpectomy Left 01/12/2015    Procedure: LEFT  BREAST LUMPECTOMY RE-EXCISION OF MARGINS;  Surgeon: Fanny Skates,  MD;  Location: [place];  Service: General;  Laterality: Left;  . Left breast biopsy  12/08/14  . Mastectomy w/ sentinel node biopsy Left 04/07/2015    nipple sparing   . Nipple sparing mastectomy/sentinal lymph node biopsy/reconstruction/placement of tissue expander Left 04/07/2015    Procedure: LEFT NIPPLE SPARING MASTECTOMY WITH LEFT SENTINAL LYMPH NODE BIOPSY AND  RECONSTRUCTION WITH PLACEMENT OF TISSUE EXPANDER;  Surgeon: Fanny Skates, MD;  Location: [place];  Service: General;  Laterality: Left;  . Breast reconstruction with placement of tissue expander and flex hd (acellular hydrated dermis) Left 04/07/2015    Procedure: IMMEDIATE LEFT BREAST RECONSTRUCTION WITH PLACEMENT SALINE IMPLANT AND CHEST WALL RECONSTRUCTION WITH  ACELLULAR DERMAL MATRIX ;  Surgeon: Crissie Reese, MD;  Location: [place];  Service: Plastics;  Laterality: Left;    There were no vitals filed for this visit.  Visit Diagnosis:  Arm pain, left  Shoulder joint stiffness, left      Subjective Assessment - 08/04/15 0939    Subjective I did 7 reps of each [place] exercise yesterday and I feel pretty good today.    Currently in Pain? No/denies            [facility] PT Assessment - 08/04/15 0001    AROM   Left Shoulder Flexion 154 Degrees  Left Shoulder ABduction 136 Degrees   Left Shoulder Internal Rotation 62 Degrees   Left Shoulder External Rotation 74 Degrees                     [facility] Adult PT Treatment/Exercise - 08/04/15 0001    Shoulder Exercises: Pulleys   Flexion 2 minutes   ABduction 2 minutes   Shoulder Exercises: Therapy Ball   Flexion 10 reps  With 3-5 second holds at top   Shoulder Exercises: ROM/Strengthening   Other ROM/Strengthening Exercises Modified downward dog on wall 5 reps for 5 seconds    Manual Therapy   Passive ROM In Supine: PROM to left shoulder all planes to pt tolerance                   Short Term Clinic Goals - 07/25/15 1001    CC Short  Term Goal  #1   Title Patient will be independent in a basic home exercise program ( 07/28/2015   Status Achieved   CC Short Term Goal  #2   Title Patient will report a decrease in pain to a "6 "  so they can perform daily activities with greater ease   Status Achieved             Long Term Clinic Goals - 08/04/15 1056    CC Long Term Goal  #1   Title Patient will improve left shoulder abduction to 120 degrees so that she can get dressed with greater ease .  136 degrees attained today 08/04/15   Status Achieved   CC Long Term Goal  #2   Title Patient will report a decrease in pain  to a level 3  so they can perform daily activities with greater ease   Status Achieved   CC Long Term Goal  #3   Title Patient will decrease the DASH score to < 40   to demonstrate increased functional use of upper extremity   Status On-going            Plan - 08/04/15 1044    Clinical Impression Statement Pt continues to do very well with therapy toleratin all exercises well without c/o pain any longer. She is very compliant wiht her HEP and her AROM measurements continue to improve making good progress with her goals.   Pt will benefit from skilled therapeutic intervention in order to improve on the following deficits Decreased range of motion;Pain;Decreased skin integrity;Increased edema;Impaired UE functional use;Impaired perceived functional ability;Decreased strength;Decreased endurance   Rehab Potential Good   Clinical Impairments Affecting Rehab Potential Restrictions include "no massage" ; diagnosed with frozen shoulder of Lt UE 07/20/15   PT Frequency 2x / week   PT Duration 6 weeks   PT Treatment/Interventions ADLs/Self Care Home Management;Passive range of motion;Therapeutic exercise;Therapeutic activities   PT Next Visit Plan Instruct in supine scapular stabs series and continue A/AA/PROM. Probable discharge next week per primary POC as pt has progressed well with her goals.    PT Home  Exercise Plan Cont with [place] and Lt shoulder stretching   Consulted and Agree with Plan of Care Patient        Problem List Patient Active Problem List   Diagnosis Date Noted  . Breast cancer, left breast ([place]) 04/07/2015  . Breast cancer of upper-outer quadrant of left female breast ([place]) 01/04/2015  . Gall stones 08/14/2011  . GERD (gastroesophageal reflux disease) 08/14/2011  . Abdominal gas pain 08/14/2011  . Anxiety disorder  08/14/2011    Otelia Limes, PTA 08/04/2015, 10:59 AM    PHYSICAL THERAPY DISCHARGE SUMMARY  Visits from Start of Care: 8 Current functional level related to goals / functional outcomes: Please see above    Remaining deficits: Decreased range of motion of left shoulder    Education / Equipment: Home exercise program  Plan: Patient agrees to discharge.  Patient goals were partially met. Patient is being discharged due to being pleased with the current functional level.  ?????       Maudry Diego, PT 08/09/2015 5:25 PM     [place], [place], [address] Phone: [phone]   Fax:  [phone]  Name: ATHENA BALTZ MRN: [id number] Date of Birth: 1951/05/08

## 2015-08-09 ENCOUNTER — Ambulatory Visit: Payer: Medicare Other

## 2015-08-11 ENCOUNTER — Ambulatory Visit: Payer: Medicare Other

## 2015-08-26 ENCOUNTER — Encounter: Payer: Self-pay | Admitting: Nurse Practitioner

## 2015-08-26 DIAGNOSIS — C50412 Malignant neoplasm of upper-outer quadrant of left female breast: Secondary | ICD-10-CM

## 2015-08-26 NOTE — Progress Notes (Signed)
The Survivorship Care Plan was mailed to [name] as she reported not being able to come in to the Survivorship Clinic for an in-person visit at this time. A letter was mailed to her outlining the purpose of the content of the care plan, as well as encouraging her to reach out to me with any questions or concerns.  My business card was included in the correspondence to the patient as well.  A copy of the care plan was also routed/faxed/mailed to [name], Ana Bachelor, [name], the patient's PCP.  I will not be placing any follow-up appointments to the Survivorship Clinic for [name], but I am happy to see her at any time in the future for any survivorship concerns that may arise. Thank you for allowing me to participate in her care!  Kenn File, [place] [phone]

## 2015-09-16 DIAGNOSIS — Z23 Encounter for immunization: Secondary | ICD-10-CM | POA: Diagnosis not present

## 2015-09-22 ENCOUNTER — Other Ambulatory Visit: Payer: Self-pay | Admitting: Obstetrics & Gynecology

## 2015-09-22 DIAGNOSIS — Z124 Encounter for screening for malignant neoplasm of cervix: Secondary | ICD-10-CM | POA: Diagnosis not present

## 2015-09-26 DIAGNOSIS — N950 Postmenopausal bleeding: Secondary | ICD-10-CM | POA: Diagnosis not present

## 2015-09-26 LAB — CYTOLOGY - PAP

## 2015-10-12 NOTE — H&P (Signed)
[name] is an 65 y.o. female. Presents with post menopausal bleeding  Pertinent Gynecological History:  Last mammogram: normal Date: 11/2014 Last pap: normal Date: 08/2015 OB History: G1, P1001   Menstrual History: No LMP recorded. Patient is postmenopausal.    Past Medical History  Diagnosis Date  . Anxiety   . GERD (gastroesophageal reflux disease)   . Ductal carcinoma in situ (DCIS) of left breast   12/08/14    Left Upper Outer Quadrant  . Heart murmur     baby  . Depression   . Chronic kidney disease     released from [facility] feb 16 states blood found in urine in past-tests done but could not find reason for blood  . Cancer (HCC)     breast  . PONV (postoperative nausea and vomiting)     with cataract surgery and ist lumpectomy req patch and iv med for nausea    Past Surgical History  Procedure Laterality Date  . Cataracts l eye Left   . Cataract extraction    . Dental surgery      rt upper tooth removed  and crown? placed  . Cholecystectomy  09/17/2011    Procedure: LAPAROSCOPIC CHOLECYSTECTOMY WITH INTRAOPERATIVE CHOLANGIOGRAM;  Surgeon: [name], MD;  Location: [place];  Service: General;  Laterality: N/A;  carm   . Eye surgery    . Breast lumpectomy with radioactive seed localization Left 01/04/2015    Procedure: LEFT PARTIAL MASTECTOMY WITH RADIOACTIVE SEED LOCALIZATION;  Surgeon: Fanny Skates, MD;  Location: [place];  Service: General;  Laterality: Left;  . Re-excision of breast lumpectomy Left 01/12/2015    Procedure: LEFT  BREAST LUMPECTOMY RE-EXCISION OF MARGINS;  Surgeon: Fanny Skates, MD;  Location: [place];  Service: General;  Laterality: Left;  . Left breast biopsy  12/08/14  . Mastectomy w/ sentinel node biopsy Left 04/07/2015    nipple sparing   . Nipple sparing mastectomy/sentinal lymph node biopsy/reconstruction/placement of tissue expander Left 04/07/2015    Procedure: LEFT NIPPLE SPARING MASTECTOMY WITH LEFT SENTINAL LYMPH NODE  BIOPSY AND  RECONSTRUCTION WITH PLACEMENT OF TISSUE EXPANDER;  Surgeon: Fanny Skates, MD;  Location: [place];  Service: General;  Laterality: Left;  . Breast reconstruction with placement of tissue expander and flex hd (acellular hydrated dermis) Left 04/07/2015    Procedure: IMMEDIATE LEFT BREAST RECONSTRUCTION WITH PLACEMENT SALINE IMPLANT AND CHEST WALL RECONSTRUCTION WITH  ACELLULAR DERMAL MATRIX ;  Surgeon: Crissie Reese, MD;  Location: [place];  Service: Plastics;  Laterality: Left;    Family History  Problem Relation Age of Onset  . Heart disease Mother   . Hypertension Mother   . Leukemia Father   . Heart disease Father   . Breast cancer Maternal Aunt   . Colon cancer      Father's aunt  . Cancer Brother     Social History:  reports that she quit smoking about 11 months ago. Her smoking use included Cigarettes. She has a 7.5 pack-year smoking history. She has never used smokeless tobacco. She reports that she drinks about 0.6 oz of alcohol per week. She reports that she does not use illicit drugs.  Allergies:  Allergies  Allergen Reactions  . Other Itching and Rash    Patient states that she was unable to use supportive wrap they provided at [facility] following a breast removal and reconstruction.    No prescriptions prior to admission    Review of Systems  Constitutional: Negative.  HENT: Negative.   Respiratory: Negative.   Cardiovascular: Negative.   Genitourinary: Negative.   Skin: Negative.   Neurological: Negative.   Psychiatric/Behavioral: Negative.     There were no vitals taken for this visit. Physical Exam  Constitutional: She appears well-developed.  HENT:  Head: Normocephalic.  Eyes: Pupils are equal, round, and reactive to light.  Neck: Normal range of motion.  Cardiovascular: Normal rate.   Respiratory: Effort normal.  Genitourinary: Vagina normal.  Neurological: She is alert.  Skin: Skin is warm.    Office ultrasound:  20 mm endometrial  thickness consistent with Tamoxiphene effect.  Assessment/Plan: PMB on Tamoxiphene for breast cancer.  Thickened endometrium.  Hysteroscopy, D&C, possible polypectomy  Mieczyslaw Stamas D 10/12/2015, 8:38 PM

## 2015-10-20 NOTE — Patient Instructions (Addendum)
Your procedure is scheduled on:  Friday, Feb. 24, 2017  Enter through the [employer] [facility] at:  9:00 A.M.  Pick up the phone at the desk and dial [phone].  Call this number if you have problems the morning of surgery: [phone].  Remember:  Do NOT eat food or  drink after:  Midnight Thursday, Feb. 23, 2017  Take these medicines the morning of surgery with a SIP OF WATER:  Nexium, Tamoxifen  Do NOT wear jewelry (body piercing), metal hair clips/bobby pins, make-up, or nail polish. Do NOT wear lotions, powders, or perfumes.  You may wear deoderant. Do NOT shave for 48 hours prior to surgery. Do NOT bring valuables to the hospital. Contacts, dentures, or bridgework may not be worn into surgery.  Have a responsible adult drive you home and remain with you for 24 hours after procedure.

## 2015-10-21 ENCOUNTER — Encounter (HOSPITAL_COMMUNITY): Payer: Self-pay

## 2015-10-21 ENCOUNTER — Encounter (HOSPITAL_COMMUNITY)
Admission: RE | Admit: 2015-10-21 | Discharge: 2015-10-21 | Disposition: A | Discharge status: Home / Self Care | Payer: Medicare Other | Source: Ambulatory Visit | Attending: Obstetrics & Gynecology | Admitting: Obstetrics & Gynecology

## 2015-10-21 DIAGNOSIS — C50912 Malignant neoplasm of unspecified site of left female breast: Secondary | ICD-10-CM | POA: Insufficient documentation

## 2015-10-21 DIAGNOSIS — F419 Anxiety disorder, unspecified: Secondary | ICD-10-CM | POA: Insufficient documentation

## 2015-10-21 DIAGNOSIS — F329 Major depressive disorder, single episode, unspecified: Secondary | ICD-10-CM | POA: Diagnosis not present

## 2015-10-21 DIAGNOSIS — K219 Gastro-esophageal reflux disease without esophagitis: Secondary | ICD-10-CM | POA: Insufficient documentation

## 2015-10-21 DIAGNOSIS — R011 Cardiac murmur, unspecified: Secondary | ICD-10-CM | POA: Insufficient documentation

## 2015-10-21 DIAGNOSIS — Z01812 Encounter for preprocedural laboratory examination: Secondary | ICD-10-CM | POA: Insufficient documentation

## 2015-10-21 DIAGNOSIS — N189 Chronic kidney disease, unspecified: Secondary | ICD-10-CM | POA: Insufficient documentation

## 2015-10-21 DIAGNOSIS — N950 Postmenopausal bleeding: Secondary | ICD-10-CM | POA: Insufficient documentation

## 2015-10-21 LAB — CBC
HCT: 40.1 % (ref 36.0–46.0)
Hemoglobin: 13.4 g/dL (ref 12.0–15.0)
MCH: 31.5 pg (ref 26.0–34.0)
MCHC: 33.4 g/dL (ref 30.0–36.0)
MCV: 94.1 fL (ref 78.0–100.0)
PLATELETS: 278 10*3/uL (ref 150–400)
RBC: 4.26 MIL/uL (ref 3.87–5.11)
RDW: 13.6 % (ref 11.5–15.5)
WBC: 9 10*3/uL (ref 4.0–10.5)

## 2015-10-28 ENCOUNTER — Encounter (HOSPITAL_COMMUNITY): Payer: Self-pay

## 2015-10-28 ENCOUNTER — Ambulatory Visit (HOSPITAL_COMMUNITY)
Admission: RE | Admit: 2015-10-28 | Discharge: 2015-10-28 | Disposition: A | Discharge status: Home / Self Care | Payer: Medicare Other | Source: Ambulatory Visit | Attending: Obstetrics & Gynecology | Admitting: Obstetrics & Gynecology

## 2015-10-28 ENCOUNTER — Ambulatory Visit (HOSPITAL_COMMUNITY): Payer: Medicare Other | Admitting: Anesthesiology

## 2015-10-28 ENCOUNTER — Encounter (HOSPITAL_COMMUNITY)
Admission: RE | Disposition: A | Discharge status: Home / Self Care | Payer: Self-pay | Source: Ambulatory Visit | Attending: Obstetrics & Gynecology

## 2015-10-28 DIAGNOSIS — N840 Polyp of corpus uteri: Secondary | ICD-10-CM | POA: Diagnosis not present

## 2015-10-28 DIAGNOSIS — N950 Postmenopausal bleeding: Secondary | ICD-10-CM | POA: Insufficient documentation

## 2015-10-28 DIAGNOSIS — R938 Abnormal findings on diagnostic imaging of other specified body structures: Secondary | ICD-10-CM | POA: Insufficient documentation

## 2015-10-28 HISTORY — PX: HYSTEROSCOPY WITH D & C: SHX1775

## 2015-10-28 SURGERY — DILATATION AND CURETTAGE /HYSTEROSCOPY
Anesthesia: General

## 2015-10-28 MED ORDER — MIDAZOLAM HCL 2 MG/2ML IJ SOLN
INTRAMUSCULAR | Status: AC
  Filled 2015-10-28: qty 2

## 2015-10-28 MED ORDER — ONDANSETRON HCL 4 MG/2ML IJ SOLN
INTRAMUSCULAR | Status: AC
  Filled 2015-10-28: qty 2

## 2015-10-28 MED ORDER — LIDOCAINE HCL (CARDIAC) 20 MG/ML IV SOLN
INTRAVENOUS | Status: DC | PRN
  Administered 2015-10-28: 80 mg via INTRAVENOUS

## 2015-10-28 MED ORDER — SCOPOLAMINE 1 MG/3DAYS TD PT72
MEDICATED_PATCH | TRANSDERMAL | Status: AC
  Filled 2015-10-28: qty 1

## 2015-10-28 MED ORDER — SCOPOLAMINE 1 MG/3DAYS TD PT72
MEDICATED_PATCH | TRANSDERMAL | Status: DC | PRN
  Administered 2015-10-28: 1 via TRANSDERMAL

## 2015-10-28 MED ORDER — LIDOCAINE HCL 2 % IJ SOLN
INTRAMUSCULAR | Status: AC
  Filled 2015-10-28: qty 20

## 2015-10-28 MED ORDER — LIDOCAINE HCL 2 % IJ SOLN
INTRAMUSCULAR | Status: DC | PRN
  Administered 2015-10-28: 10 mL

## 2015-10-28 MED ORDER — PROPOFOL 10 MG/ML IV BOLUS
INTRAVENOUS | Status: DC | PRN
  Administered 2015-10-28: 150 mg via INTRAVENOUS

## 2015-10-28 MED ORDER — DEXAMETHASONE SODIUM PHOSPHATE 4 MG/ML IJ SOLN
INTRAMUSCULAR | Status: AC
  Filled 2015-10-28: qty 1

## 2015-10-28 MED ORDER — KETOROLAC TROMETHAMINE 30 MG/ML IJ SOLN
INTRAMUSCULAR | Status: AC
  Filled 2015-10-28: qty 1

## 2015-10-28 MED ORDER — ONDANSETRON HCL 4 MG/2ML IJ SOLN
INTRAMUSCULAR | Status: DC | PRN
  Administered 2015-10-28: 4 mg via INTRAVENOUS

## 2015-10-28 MED ORDER — FENTANYL CITRATE (PF) 100 MCG/2ML IJ SOLN
INTRAMUSCULAR | Status: DC | PRN
  Administered 2015-10-28 (×2): 50 ug via INTRAVENOUS

## 2015-10-28 MED ORDER — PROPOFOL 10 MG/ML IV BOLUS
INTRAVENOUS | Status: AC
  Filled 2015-10-28: qty 20

## 2015-10-28 MED ORDER — LIDOCAINE HCL (CARDIAC) 20 MG/ML IV SOLN
INTRAVENOUS | Status: AC
  Filled 2015-10-28: qty 5

## 2015-10-28 MED ORDER — LACTATED RINGERS IV SOLN
INTRAVENOUS | Status: DC
  Administered 2015-10-28: 10 mL/h via INTRAVENOUS
  Administered 2015-10-28 (×3): via INTRAVENOUS

## 2015-10-28 MED ORDER — FENTANYL CITRATE (PF) 100 MCG/2ML IJ SOLN
INTRAMUSCULAR | Status: AC
  Filled 2015-10-28: qty 4

## 2015-10-28 MED ORDER — DEXAMETHASONE SODIUM PHOSPHATE 4 MG/ML IJ SOLN
INTRAMUSCULAR | Status: DC | PRN
  Administered 2015-10-28: 4 mg via INTRAVENOUS

## 2015-10-28 MED ORDER — MIDAZOLAM HCL 5 MG/5ML IJ SOLN
INTRAMUSCULAR | Status: DC | PRN
  Administered 2015-10-28: 2 mg via INTRAVENOUS

## 2015-10-28 MED ORDER — KETOROLAC TROMETHAMINE 30 MG/ML IJ SOLN
INTRAMUSCULAR | Status: DC | PRN
  Administered 2015-10-28: 30 mg via INTRAVENOUS

## 2015-10-28 SURGICAL SUPPLY — 20 items
CANISTER SUCT 3000ML (MISCELLANEOUS) ×3 IMPLANT
CATH ROBINSON RED A/P 16FR (CATHETERS) ×2 IMPLANT
CLOTH BEACON ORANGE TIMEOUT ST (SAFETY) ×3 IMPLANT
CONTAINER PREFILL 10% NBF 60ML (FORM) ×6 IMPLANT
DEVICE MYOSURE REACH (MISCELLANEOUS) ×2 IMPLANT
ELECT REM PT RETURN 9FT ADLT (ELECTROSURGICAL)
ELECTRODE REM PT RTRN 9FT ADLT (ELECTROSURGICAL) IMPLANT
GLOVE BIOGEL PI IND STRL 7.0 (GLOVE) ×1 IMPLANT
GLOVE BIOGEL PI INDICATOR 7.0 (GLOVE) ×2
GLOVE ECLIPSE 6.0 STRL STRAW (GLOVE) ×6 IMPLANT
GOWN STRL REUS W/TWL LRG LVL3 (GOWN DISPOSABLE) ×6 IMPLANT
LOOP ANGLED CUTTING 22FR (CUTTING LOOP) IMPLANT
PACK VAGINAL MINOR WOMEN LF (CUSTOM PROCEDURE TRAY) ×3 IMPLANT
PAD OB MATERNITY 4.3X12.25 (PERSONAL CARE ITEMS) ×3 IMPLANT
PAD PREP 24X48 CUFFED NSTRL (MISCELLANEOUS) ×3 IMPLANT
SEAL ROD LENS SCOPE MYOSURE (ABLATOR) ×2 IMPLANT
TOWEL OR 17X24 6PK STRL BLUE (TOWEL DISPOSABLE) ×6 IMPLANT
TUBING AQUILEX INFLOW (TUBING) ×3 IMPLANT
TUBING AQUILEX OUTFLOW (TUBING) ×3 IMPLANT
WATER STERILE IRR 1000ML POUR (IV SOLUTION) ×3 IMPLANT

## 2015-10-28 NOTE — Anesthesia Procedure Notes (Signed)
Procedure Name: LMA Insertion Date/Time: 10/28/2015 10:24 AM Performed by: Brock Ra Pre-anesthesia Checklist: Patient identified, Emergency Drugs available, Suction available, Patient being monitored and Timeout performed Patient Re-evaluated:Patient Re-evaluated prior to inductionOxygen Delivery Method: Circle system utilized Preoxygenation: Pre-oxygenation with 100% oxygen Intubation Type: IV induction Ventilation: Mask ventilation without difficulty LMA: LMA inserted LMA Size: 4.0 Grade View: Grade I Number of attempts: 1 Airway Equipment and Method: Patient positioned with wedge pillow Placement Confirmation: ETT inserted through vocal cords under direct vision,  positive ETCO2 and breath sounds checked- equal and bilateral Dental Injury: Teeth and Oropharynx as per pre-operative assessment

## 2015-10-28 NOTE — Anesthesia Preprocedure Evaluation (Addendum)
Anesthesia Evaluation  Patient identified by MRN, date of birth, ID band Patient awake    Reviewed: Allergy & Precautions, NPO status , Patient's Chart, lab work & pertinent test results  History of Anesthesia Complications (+) PONV and history of anesthetic complications  Airway Mallampati: II  TM Distance: >3 FB Neck ROM: Full    Dental no notable dental hx. (+) Dental Advisory Given   Pulmonary former smoker,    Pulmonary exam normal breath sounds clear to auscultation       Cardiovascular negative cardio ROS Normal cardiovascular exam+ Valvular Problems/Murmurs  Rhythm:Regular Rate:Normal     Neuro/Psych PSYCHIATRIC DISORDERS Anxiety Depression negative neurological ROS     GI/Hepatic Neg liver ROS, GERD  Medicated and Controlled,  Endo/Other  negative endocrine ROS  Renal/GU negative Renal ROS  negative genitourinary   Musculoskeletal negative musculoskeletal ROS (+)   Abdominal   Peds negative pediatric ROS (+)  Hematology negative hematology ROS (+)   Anesthesia Other Findings   Reproductive/Obstetrics negative OB ROS                            Anesthesia Physical Anesthesia Plan  ASA: II  Anesthesia Plan: General   Post-op Pain Management:    Induction: Intravenous  Airway Management Planned: LMA  Additional Equipment:   Intra-op Plan:   Post-operative Plan: Extubation in OR  Informed Consent: I have reviewed the patients History and Physical, chart, labs and discussed the procedure including the risks, benefits and alternatives for the proposed anesthesia with the patient or authorized representative who has indicated his/her understanding and acceptance.   Dental advisory given  Plan Discussed with: CRNA  Anesthesia Plan Comments:         Anesthesia Quick Evaluation

## 2015-10-28 NOTE — Discharge Instructions (Addendum)
Hysteroscopy, Care After Refer to this sheet in the next few weeks. These instructions provide you with information on caring for yourself after your procedure. Your health care provider may also give you more specific instructions. Your treatment has been planned according to current medical practices, but problems sometimes occur. Call your health care provider if you have any problems or questions after your procedure.  WHAT TO EXPECT AFTER THE PROCEDURE After your procedure, it is typical to have the following:  You may have some cramping. This normally lasts for a couple days.  You may have bleeding. This can vary from light spotting for a few days to menstrual-like bleeding for 3-7 days. HOME CARE INSTRUCTIONS  Rest for the first 1-2 days after the procedure.  Only take over-the-counter or prescription medicines as directed by your health care provider. Do not take aspirin. It can increase the chances of bleeding.  Take showers instead of baths for 2 weeks or as directed by your health care provider.  Do not drive for 24 hours or as directed.  Do not drink alcohol while taking pain medicine.  Do not use tampons, douche, or have sexual intercourse for 2 weeks or until your health care provider says it is okay.  Take your temperature twice a day for 4-5 days. Write it down each time.  Follow your health care provider's advice about diet, exercise, and lifting.  If you develop constipation, you may:  Take a mild laxative if your health care provider approves.  Add bran foods to your diet.  Drink enough fluids to keep your urine clear or pale yellow.  Try to have someone with you or available to you for the first 24-48 hours, especially if you were given a general anesthetic.  Call Dr. Deatra Ina [phone] if you are having any problems.  If you are having an emergency and you can't reach me, call the office [phone] or go to [place]: D&C /  D&E The following instructions have been prepared to help you care for yourself upon your return home.   Personal hygiene:  Use sanitary pads for vaginal drainage, not tampons.  Shower the day after your procedure.  NO tub baths, pools or Jacuzzis for 2-3 weeks.  Wipe front to back after using the bathroom.  Activity and limitations:  Do NOT drive or operate any equipment for 24 hours. The effects of anesthesia are still present and drowsiness may result.  Do NOT rest in bed all day.  Walking is encouraged.  Walk up and down stairs slowly.  You may resume your normal activity in one to two days or as indicated by your physician.  Sexual activity: NO intercourse for at least 2 weeks after the procedure, or as indicated by your physician.  Diet: Eat a light meal as desired this evening. You may resume your usual diet tomorrow.  Return to work: You may resume your work activities in one to two days or as indicated by your doctor.  What to expect after your surgery: Expect to have vaginal bleeding/discharge for 2-3 days and spotting for up to 10 days. It is not unusual to have soreness for up to 1-2 weeks. You may have a slight burning sensation when you urinate for the first day. Mild cramps may continue for a couple of days. You may have a regular period in 2-6 weeks.  Call your doctor for any of the following:  Excessive vaginal bleeding, saturating and changing one pad every hour.  Inability to urinate 6 hours after discharge from hospital.  Pain not relieved by pain medication.  Fever of 100.4 F or greater.  Unusual vaginal discharge or odor.   Call for an appointment:    Patients signature: ______________________  Nurses signature ________________________  Support person's signature_______________________

## 2015-10-28 NOTE — Transfer of Care (Signed)
Immediate Anesthesia Transfer of Care Note  Patient: [name]  Procedure(s) Performed: Procedure(s): DILATATION AND CURETTAGE /HYSTEROSCOPY, Myosure (N/A)  Patient Location: PACU  Anesthesia Type:General  Level of Consciousness: awake, alert , oriented and patient cooperative  Airway & Oxygen Therapy: Patient Spontanous Breathing and Patient connected to nasal cannula oxygen  Post-op Assessment: Report given to RN and Post -op Vital signs reviewed and stable  Post vital signs: Reviewed and stable  Last Vitals: TEMP 98.1 BP153/ 91 HR 62 RR 8 POX [id number]  Complications: No apparent anesthesia complications

## 2015-10-28 NOTE — Op Note (Signed)
Patient Name: [name] MRN: [id number]  Date of Surgery: 10/28/2015    PREOPERATIVE DIAGNOSIS: POSTMENOPAUSAL BLEEDING  POSTOPERATIVE DIAGNOSIS: POSTMENOPAUSAL BLEEDING   PROCEDURE: Hysteroscopy with endometrial biopsy and curettage  SURGEON: Janice Coffin. Deatra Ina M.D.  ANESTHESIA: General  ESTIMATED BLOOD LOSS: Minimal  FINDINGS: Small polypoid endometrial thickening.   INDICATIONS: Post menopausal bleeding on Tamoxifen with thickened endometrium on ultrasound   PROCEDURE IN DETAIL: The patient was taken to the OR and placed in the dors-lithotomy position. The perineum and vagina were prepped and draped in a sterile fashion. Bimanual exam revealed an anteverted,normal sized uterus. 10 ml of 2% lidocaine was infiltrated in the paracervical tissue and Pratt dilators were used to open the cervix to 39 [place].  A myosure hysteroscope was inserted and the endometrium was inspected.  The left ostium was seen but the right was not.  The myosure instrument was used to remove the polyp and the thickened endometrium.  Sharp curettage was then performed.  All collected tissue was sent for pathologic evaluation.  The procedure was then terminated and the patient left the operating room in good condition.

## 2015-10-28 NOTE — Progress Notes (Signed)
I have interviewed and performed the pertinent exams on my patient to confirm that there have been no significant changes in her condition since the dictation of her history and physical exam.  

## 2015-10-29 NOTE — Anesthesia Postprocedure Evaluation (Signed)
Anesthesia Post Note  Patient: [name]  Procedure(s) Performed: Procedure(s) (LRB): DILATATION AND CURETTAGE /HYSTEROSCOPY, Myosure (N/A)  Anesthesia Type: General Level of consciousness: awake and alert Pain management: pain level controlled Vital Signs Assessment: post-procedure vital signs reviewed and stable Respiratory status: spontaneous breathing, nonlabored ventilation, respiratory function stable and patient connected to nasal cannula oxygen Cardiovascular status: blood pressure returned to baseline and stable Postop Assessment: no signs of nausea or vomiting Anesthetic complications: no    Last Vitals:  Filed Vitals:   10/28/15 1245 10/28/15 1250  BP: 169/79 153/71  Pulse:    Temp:    Resp:      Last Pain: There were no vitals filed for this visit.               Bernadette Armijo JENNETTE

## 2015-11-01 ENCOUNTER — Encounter (HOSPITAL_COMMUNITY): Payer: Self-pay | Admitting: Obstetrics & Gynecology

## 2015-11-01 NOTE — Addendum Note (Signed)
Addendum  created 11/01/15 1043 by Laverle Hobby, CRNA   Modules edited: Charges VN

## 2015-11-10 ENCOUNTER — Other Ambulatory Visit: Payer: Self-pay | Admitting: Internal Medicine

## 2015-11-10 ENCOUNTER — Other Ambulatory Visit: Payer: Self-pay

## 2015-11-10 DIAGNOSIS — Z1231 Encounter for screening mammogram for malignant neoplasm of breast: Secondary | ICD-10-CM

## 2015-11-14 DIAGNOSIS — C50912 Malignant neoplasm of unspecified site of left female breast: Secondary | ICD-10-CM | POA: Diagnosis not present

## 2015-12-05 ENCOUNTER — Ambulatory Visit
Admission: RE | Admit: 2015-12-05 | Discharge: 2015-12-05 | Disposition: A | Discharge status: Home / Self Care | Payer: Medicare Other | Source: Ambulatory Visit

## 2015-12-05 DIAGNOSIS — Z1231 Encounter for screening mammogram for malignant neoplasm of breast: Secondary | ICD-10-CM

## 2015-12-09 ENCOUNTER — Encounter: Payer: Self-pay | Admitting: Hematology and Oncology

## 2015-12-09 ENCOUNTER — Telehealth: Payer: Self-pay | Admitting: Hematology and Oncology

## 2015-12-09 ENCOUNTER — Ambulatory Visit (HOSPITAL_COMMUNITY)
Admission: RE | Admit: 2015-12-09 | Discharge: 2015-12-09 | Disposition: A | Discharge status: Home / Self Care | Payer: Medicare Other | Source: Ambulatory Visit | Attending: Hematology and Oncology | Admitting: Hematology and Oncology

## 2015-12-09 ENCOUNTER — Ambulatory Visit (HOSPITAL_BASED_OUTPATIENT_CLINIC_OR_DEPARTMENT_OTHER): Payer: Medicare Other | Admitting: Hematology and Oncology

## 2015-12-09 VITALS — BP 170/74 | HR 63 | Temp 98.0°F | Resp 17 | Ht 63.0 in | Wt 198.0 lb

## 2015-12-09 DIAGNOSIS — C50412 Malignant neoplasm of upper-outer quadrant of left female breast: Secondary | ICD-10-CM | POA: Diagnosis not present

## 2015-12-09 DIAGNOSIS — M7989 Other specified soft tissue disorders: Secondary | ICD-10-CM | POA: Insufficient documentation

## 2015-12-09 DIAGNOSIS — Z170 Estrogen receptor positive status [ER+]: Secondary | ICD-10-CM

## 2015-12-09 DIAGNOSIS — Z79899 Other long term (current) drug therapy: Secondary | ICD-10-CM | POA: Insufficient documentation

## 2015-12-09 NOTE — Telephone Encounter (Signed)
appt made and avs printed °

## 2015-12-09 NOTE — Progress Notes (Signed)
Patient Care Team: Levin Erp, MD as PCP - General (Internal Medicine) Fanny Skates, MD as Consulting Physician (General Surgery) Nicholas Lose, MD as Consulting Physician (Hematology and Oncology) Arloa Koh, MD as Consulting Physician (Radiation Oncology) Crissie Reese, MD as Consulting Physician (Plastic Surgery) Sylvan Cheese, NP as Nurse Practitioner (Hematology and Oncology)  DIAGNOSIS: Breast cancer of upper-outer quadrant of left female breast [facility])   Staging form: Breast, AJCC 7th Edition     Clinical: Stage 0 (Tis (DCIS), N0, M0) - Unsigned     Pathologic stage from 04/07/2015: Stage 0 (Tis (DCIS), N0, cM0) - Unsigned   SUMMARY OF ONCOLOGIC HISTORY:   Breast cancer of upper-outer quadrant of left female breast ([place])   12/01/2014 Mammogram Left breast: new calcifications warranting further imaging   12/08/2014 Initial Biopsy Left breast core needle bx (UOQ): DCIS with calcifications, high grade, ER+ (100%), PR- (0%).   12/13/2014 Breast MRI Left breast patchy diffuse multifocal enhancement with postbiopsy changes   01/04/2015 Surgery Left lumpectomy [facility]): DCIS, 3.3 cm, grade 2, ER+ (100%), PR- (0%), margins were close   01/12/2015 Surgery Re-excision of left margin: close   02/15/2015 Mammogram Left breast: residual calcifications similar in appearance to the previously biopsied malignant calcifications located along the anterior inferior margin and extending for approximately 3 cm inferior and anterior to the anterior portion of the seroma   03/01/2015 Procedure Left breast core needle bx: DCIS with calcifications: ER+ (100%), PR+ (10%), grade 2   03/01/2015 Clinical Stage Stage 0: Tis N0   04/07/2015 Definitive Surgery Left mastectomy/SLNB Dalbert Batman) with placement of saline implant and reconstruction with acellular dermal matrix  [facility] Mares): DCIS with calcifications, ALH, LCIS, 0/2 lymph nodes negative, grade 2, tumor size 3 cm, margins 3 mm, ER+ (100%), PR+ (10%)    04/07/2015 Pathologic Stage Stage 0: Tis N0   05/24/2015 -  Anti-estrogen oral therapy  tamoxifen 20 mg daily 5 years   08/26/2015 Survivorship Survivorship care plan completed and mailed to patient in lieu of in person visit at her request    Breast cancer, left breast ([place])   04/07/2015 Initial Diagnosis Breast cancer, left breast ([place])    CHIEF COMPLIANT: Follow-up on tamoxifen  INTERVAL HISTORY: [name] is a 65 year old with above-mentioned history of left breast cancer currently on tamoxifen therapy and appears to be tolerating it fairly well. She does have hot flashes. Her blood pressures been going up and down. She is also concerned about her weight gain. She has noticed a swelling of the left leg over the past week or so.  REVIEW OF SYSTEMS:   Constitutional: Denies fevers, chills or abnormal weight loss Eyes: Denies blurriness of vision Ears, nose, mouth, throat, and face: Denies mucositis or sore throat Respiratory: Denies cough, dyspnea or wheezes Cardiovascular: Denies palpitation, chest discomfort Gastrointestinal:  Denies nausea, heartburn or change in bowel habits Skin: Denies abnormal skin rashes Lymphatics: Denies new lymphadenopathy or easy bruising Neurological:Denies numbness, tingling or new weaknesses Behavioral/Psych: Mood is stable, no new changes  Extremities: Left greater than right lower extremity edema Breast:  denies any pain or lumps or nodules in either breasts All other systems were reviewed with the patient and are negative.  I have reviewed the past medical history, past surgical history, social history and family history with the patient and they are unchanged from previous note.  ALLERGIES:  is allergic to other.  MEDICATIONS:  Current Outpatient Prescriptions  Medication Sig Dispense Refill  . aspirin 81 MG chewable tablet  Chew 81 mg by mouth daily.    . Biotin 5000 MCG TABS Take 1 tablet by mouth daily.    . Calcium Carbonate-Vitamin D  (CALCIUM 600 + D PO) Take 2 tablets by mouth daily.      . Cholecalciferol (VITAMIN D) 2000 units tablet Take 2,000 Units by mouth daily.    Marland Kitchen docusate sodium (COLACE) 100 MG capsule Take 1 capsule (100 mg total) by mouth daily. 10 capsule 0  . esomeprazole (NEXIUM) 20 MG capsule Take 20 mg by mouth daily as needed (For heartburn.).     Marland Kitchen Multiple Vitamins-Minerals (CENTRUM SILVER PO) Take 1 tablet by mouth daily.     . tamoxifen (NOLVADEX) 20 MG tablet Take 1 tablet (20 mg total) by mouth daily. 90 tablet 3   No current facility-administered medications for this visit.    PHYSICAL EXAMINATION: ECOG PERFORMANCE STATUS: 1 - Symptomatic but completely ambulatory  Filed Vitals:   12/09/15 1021  BP: 170/74  Pulse: 63  Temp: 98 F (36.7 C)  Resp: 17   Filed Weights   12/09/15 1021  Weight: 198 lb (89.812 kg)    GENERAL:alert, no distress and comfortable SKIN: skin color, texture, turgor are normal, no rashes or significant lesions EYES: normal, Conjunctiva are pink and non-injected, sclera clear OROPHARYNX:no exudate, no erythema and lips, buccal mucosa, and tongue normal  NECK: supple, thyroid normal size, non-tender, without nodularity LYMPH:  no palpable lymphadenopathy in the cervical, axillary or inguinal LUNGS: clear to auscultation and percussion with normal breathing effort HEART: regular rate & rhythm and no murmurs and no lower extremity edema ABDOMEN:abdomen soft, non-tender and normal bowel sounds MUSCULOSKELETAL:no cyanosis of digits and no clubbing  NEURO: alert & oriented x 3 with fluent speech, no focal motor/sensory deficits EXTREMITIES: 1+ lower extremity edema  LABORATORY DATA:  I have reviewed the data as listed   Chemistry      Component Value Date/Time   NA 141 04/01/2015 1429   K 3.9 04/01/2015 1429   CL 109 04/01/2015 1429   CO2 24 04/01/2015 1429   BUN 9 04/01/2015 1429   CREATININE 0.75 04/01/2015 1429      Component Value Date/Time   CALCIUM  9.4 04/01/2015 1429   ALKPHOS 92 04/01/2015 1429   AST 25 04/01/2015 1429   ALT 24 04/01/2015 1429   BILITOT 1.3* 04/01/2015 1429       Lab Results  Component Value Date   WBC 9.0 10/21/2015   HGB 13.4 10/21/2015   HCT 40.1 10/21/2015   MCV 94.1 10/21/2015   PLT 278 10/21/2015   NEUTROABS 5.0 04/01/2015   ASSESSMENT & PLAN:  Breast cancer of upper-outer quadrant of left female breast Left lumpectomy 01/04/2015: DCIS 3.3 cm, grade 2, ER 100%, PR 0%, margins were close underwent reexcision margins negative, repeat mammogram showed calcifications and biopsy showed DCIS  left mastectomy with immediate reconstruction 04/07/2015: DCIS with calcifications, ALH, LCIS, 0/2 lymph nodes negative, grade 2, tumor size 3 cm, margins 3 mm, ER 100%, PR 10% Tis N0 stage 0  Recommendation: 1. Adjuvant tamoxifen 20 mg daily 5 years started mid September 2016.  D&C uterus: Endometrial polyp 10/31/2015  Tamoxifen Toxicities: very occasional night sweats otherwise doing well  Survivorship: Discussed the importance of physical exercise in decreasing the likelihood of breast cancer recurrence. Recommended 30 mins daily 6 days a week of either brisk walking or cycling or swimming. Encouraged patient to eat more fruits and vegetables and decrease red meat.   Return  to clinic 6 months for follow up Leg swelling: We will get an ultrasound of the leg to evaluate for DVT.  No orders of the defined types were placed in this encounter.   The patient has a good understanding of the overall plan. she agrees with it. she will call with any problems that may develop before the next visit here.   Rulon Eisenmenger, MD 12/09/2015

## 2015-12-09 NOTE — Progress Notes (Signed)
*  PRELIMINARY RESULTS* Vascular Ultrasound Lower extremity venous duplex has been completed.  Preliminary findings: No evidence of DVT or baker's cyst.   Called results to Dr. Lindi Adie.  Landry Mellow, RDMS, RVT  12/09/2015, 5:09 PM

## 2015-12-09 NOTE — Assessment & Plan Note (Signed)
Left lumpectomy 01/04/2015: DCIS 3.3 cm, grade 2, ER 100%, PR 0%, margins were close underwent reexcision margins negative, repeat mammogram showed calcifications and biopsy showed DCIS  left mastectomy with immediate reconstruction 04/07/2015: DCIS with calcifications, ALH, LCIS, 0/2 lymph nodes negative, grade 2, tumor size 3 cm, margins 3 mm, ER 100%, PR 10% Tis N0 stage 0  Recommendation: 1. Adjuvant tamoxifen 20 mg daily 5 years started mid September 2016.  D&C uterus: Endometrial polyp 10/31/2015  Tamoxifen Toxicities: very occasional night sweats otherwise doing well  Survivorship: Discussed the importance of physical exercise in decreasing the likelihood of breast cancer recurrence. Recommended 30 mins daily 6 days a week of either brisk walking or cycling or swimming. Encouraged patient to eat more fruits and vegetables and decrease red meat.   Return to clinic 6 months for follow up

## 2015-12-28 DIAGNOSIS — M8580 Other specified disorders of bone density and structure, unspecified site: Secondary | ICD-10-CM | POA: Diagnosis not present

## 2015-12-28 DIAGNOSIS — D0590 Unspecified type of carcinoma in situ of unspecified breast: Secondary | ICD-10-CM | POA: Diagnosis not present

## 2015-12-28 DIAGNOSIS — Z0000 Encounter for general adult medical examination without abnormal findings: Secondary | ICD-10-CM | POA: Diagnosis not present

## 2015-12-28 DIAGNOSIS — R030 Elevated blood-pressure reading, without diagnosis of hypertension: Secondary | ICD-10-CM | POA: Diagnosis not present

## 2015-12-28 DIAGNOSIS — I119 Hypertensive heart disease without heart failure: Secondary | ICD-10-CM | POA: Diagnosis not present

## 2016-01-27 DIAGNOSIS — I119 Hypertensive heart disease without heart failure: Secondary | ICD-10-CM | POA: Diagnosis not present

## 2016-01-27 DIAGNOSIS — E039 Hypothyroidism, unspecified: Secondary | ICD-10-CM | POA: Diagnosis not present

## 2016-03-02 ENCOUNTER — Encounter (HOSPITAL_COMMUNITY): Payer: Self-pay | Admitting: *Deleted

## 2016-03-02 ENCOUNTER — Emergency Department (HOSPITAL_COMMUNITY): Payer: Medicare Other

## 2016-03-02 ENCOUNTER — Inpatient Hospital Stay (HOSPITAL_COMMUNITY)
Admission: EM | Admit: 2016-03-02 | Discharge: 2016-03-06 | DRG: 871 | Disposition: A | Discharge status: Home / Self Care | Payer: Medicare Other | Attending: Internal Medicine | Admitting: Internal Medicine

## 2016-03-02 DIAGNOSIS — K863 Pseudocyst of pancreas: Secondary | ICD-10-CM | POA: Diagnosis present

## 2016-03-02 DIAGNOSIS — K219 Gastro-esophageal reflux disease without esophagitis: Secondary | ICD-10-CM | POA: Diagnosis present

## 2016-03-02 DIAGNOSIS — E876 Hypokalemia: Secondary | ICD-10-CM | POA: Diagnosis present

## 2016-03-02 DIAGNOSIS — Z853 Personal history of malignant neoplasm of breast: Secondary | ICD-10-CM

## 2016-03-02 DIAGNOSIS — F419 Anxiety disorder, unspecified: Secondary | ICD-10-CM | POA: Diagnosis present

## 2016-03-02 DIAGNOSIS — E8770 Fluid overload, unspecified: Secondary | ICD-10-CM | POA: Diagnosis not present

## 2016-03-02 DIAGNOSIS — Z9842 Cataract extraction status, left eye: Secondary | ICD-10-CM

## 2016-03-02 DIAGNOSIS — T502X5A Adverse effect of carbonic-anhydrase inhibitors, benzothiadiazides and other diuretics, initial encounter: Secondary | ICD-10-CM | POA: Diagnosis present

## 2016-03-02 DIAGNOSIS — Z91048 Other nonmedicinal substance allergy status: Secondary | ICD-10-CM

## 2016-03-02 DIAGNOSIS — F329 Major depressive disorder, single episode, unspecified: Secondary | ICD-10-CM | POA: Diagnosis present

## 2016-03-02 DIAGNOSIS — D6489 Other specified anemias: Secondary | ICD-10-CM | POA: Diagnosis present

## 2016-03-02 DIAGNOSIS — K852 Alcohol induced acute pancreatitis without necrosis or infection: Secondary | ICD-10-CM | POA: Diagnosis not present

## 2016-03-02 DIAGNOSIS — D500 Iron deficiency anemia secondary to blood loss (chronic): Secondary | ICD-10-CM | POA: Diagnosis not present

## 2016-03-02 DIAGNOSIS — R748 Abnormal levels of other serum enzymes: Secondary | ICD-10-CM | POA: Diagnosis not present

## 2016-03-02 DIAGNOSIS — L03313 Cellulitis of chest wall: Secondary | ICD-10-CM | POA: Diagnosis not present

## 2016-03-02 DIAGNOSIS — C50912 Malignant neoplasm of unspecified site of left female breast: Secondary | ICD-10-CM | POA: Diagnosis not present

## 2016-03-02 DIAGNOSIS — R112 Nausea with vomiting, unspecified: Secondary | ICD-10-CM | POA: Diagnosis not present

## 2016-03-02 DIAGNOSIS — K859 Acute pancreatitis without necrosis or infection, unspecified: Secondary | ICD-10-CM | POA: Diagnosis present

## 2016-03-02 DIAGNOSIS — R933 Abnormal findings on diagnostic imaging of other parts of digestive tract: Secondary | ICD-10-CM | POA: Diagnosis not present

## 2016-03-02 DIAGNOSIS — T402X5A Adverse effect of other opioids, initial encounter: Secondary | ICD-10-CM | POA: Diagnosis not present

## 2016-03-02 DIAGNOSIS — I10 Essential (primary) hypertension: Secondary | ICD-10-CM | POA: Diagnosis not present

## 2016-03-02 DIAGNOSIS — R1011 Right upper quadrant pain: Secondary | ICD-10-CM | POA: Diagnosis not present

## 2016-03-02 DIAGNOSIS — N189 Chronic kidney disease, unspecified: Secondary | ICD-10-CM | POA: Diagnosis present

## 2016-03-02 DIAGNOSIS — Z79810 Long term (current) use of selective estrogen receptor modulators (SERMs): Secondary | ICD-10-CM | POA: Diagnosis not present

## 2016-03-02 DIAGNOSIS — E039 Hypothyroidism, unspecified: Secondary | ICD-10-CM | POA: Diagnosis present

## 2016-03-02 DIAGNOSIS — F32A Depression, unspecified: Secondary | ICD-10-CM | POA: Diagnosis present

## 2016-03-02 DIAGNOSIS — Z7982 Long term (current) use of aspirin: Secondary | ICD-10-CM

## 2016-03-02 DIAGNOSIS — R6520 Severe sepsis without septic shock: Secondary | ICD-10-CM | POA: Diagnosis present

## 2016-03-02 DIAGNOSIS — Z87891 Personal history of nicotine dependence: Secondary | ICD-10-CM

## 2016-03-02 DIAGNOSIS — Z79899 Other long term (current) drug therapy: Secondary | ICD-10-CM | POA: Diagnosis not present

## 2016-03-02 DIAGNOSIS — K5909 Other constipation: Secondary | ICD-10-CM | POA: Diagnosis present

## 2016-03-02 DIAGNOSIS — A419 Sepsis, unspecified organism: Secondary | ICD-10-CM | POA: Diagnosis not present

## 2016-03-02 DIAGNOSIS — Z9012 Acquired absence of left breast and nipple: Secondary | ICD-10-CM

## 2016-03-02 DIAGNOSIS — R932 Abnormal findings on diagnostic imaging of liver and biliary tract: Secondary | ICD-10-CM | POA: Diagnosis not present

## 2016-03-02 DIAGNOSIS — R079 Chest pain, unspecified: Secondary | ICD-10-CM | POA: Diagnosis not present

## 2016-03-02 DIAGNOSIS — K858 Other acute pancreatitis without necrosis or infection: Secondary | ICD-10-CM | POA: Diagnosis not present

## 2016-03-02 DIAGNOSIS — R1013 Epigastric pain: Secondary | ICD-10-CM | POA: Diagnosis not present

## 2016-03-02 LAB — URINE MICROSCOPIC-ADD ON

## 2016-03-02 LAB — CBC
HCT: 43 % (ref 36.0–46.0)
Hemoglobin: 14.1 g/dL (ref 12.0–15.0)
MCH: 31.3 pg (ref 26.0–34.0)
MCHC: 32.8 g/dL (ref 30.0–36.0)
MCV: 95.6 fL (ref 78.0–100.0)
PLATELETS: 285 10*3/uL (ref 150–400)
RBC: 4.5 MIL/uL (ref 3.87–5.11)
RDW: 13.4 % (ref 11.5–15.5)
WBC: 18.6 10*3/uL — AB (ref 4.0–10.5)

## 2016-03-02 LAB — URINALYSIS, ROUTINE W REFLEX MICROSCOPIC
BILIRUBIN URINE: NEGATIVE
Glucose, UA: NEGATIVE mg/dL
Ketones, ur: 15 mg/dL — AB
NITRITE: NEGATIVE
PH: 5.5 (ref 5.0–8.0)
Protein, ur: NEGATIVE mg/dL
SPECIFIC GRAVITY, URINE: 1.025 (ref 1.005–1.030)

## 2016-03-02 LAB — LIPID PANEL
Cholesterol: 138 mg/dL (ref 0–200)
HDL: 53 mg/dL (ref >40)
LDL CALC: 66 mg/dL (ref 0–99)
TRIGLYCERIDES: 94 mg/dL (ref <150)
Total CHOL/HDL Ratio: 2.6 RATIO
VLDL: 19 mg/dL (ref 0–40)

## 2016-03-02 LAB — HEPATIC FUNCTION PANEL
ALBUMIN: 3 g/dL — AB (ref 3.5–5.0)
ALK PHOS: 49 U/L (ref 38–126)
ALT: 31 U/L (ref 14–54)
AST: 36 U/L (ref 15–41)
BILIRUBIN DIRECT: 0.3 mg/dL (ref 0.1–0.5)
BILIRUBIN TOTAL: 1.2 mg/dL (ref 0.3–1.2)
Indirect Bilirubin: 0.9 mg/dL (ref 0.3–0.9)
Total Protein: 5.5 g/dL — ABNORMAL LOW (ref 6.5–8.1)

## 2016-03-02 LAB — PROTIME-INR
INR: 1.1 (ref 0.00–1.49)
PROTHROMBIN TIME: 14.4 s (ref 11.6–15.2)

## 2016-03-02 LAB — I-STAT CG4 LACTIC ACID, ED
LACTIC ACID, VENOUS: 4.37 mmol/L — AB (ref 0.5–1.9)
LACTIC ACID, VENOUS: 2.11 mmol/L — AB (ref 0.5–1.9)
Lactic Acid, Venous: 2.24 mmol/L (ref 0.5–1.9)

## 2016-03-02 LAB — PROCALCITONIN: PROCALCITONIN: 0.57 ng/mL

## 2016-03-02 LAB — LACTIC ACID, PLASMA
LACTIC ACID, VENOUS: 0.9 mmol/L (ref 0.5–1.9)
Lactic Acid, Venous: 1.6 mmol/L (ref 0.5–1.9)

## 2016-03-02 LAB — BASIC METABOLIC PANEL
ANION GAP: 14 (ref 5–15)
BUN: 16 mg/dL (ref 6–20)
CALCIUM: 9.7 mg/dL (ref 8.9–10.3)
CO2: 25 mmol/L (ref 22–32)
CREATININE: 0.9 mg/dL (ref 0.44–1.00)
Chloride: 98 mmol/L — ABNORMAL LOW (ref 101–111)
GFR calc non Af Amer: >60 mL/min (ref >60)
Glucose, Bld: 138 mg/dL — ABNORMAL HIGH (ref 65–99)
Potassium: 3.1 mmol/L — ABNORMAL LOW (ref 3.5–5.1)
SODIUM: 137 mmol/L (ref 135–145)

## 2016-03-02 LAB — APTT: aPTT: 31 seconds (ref 24–37)

## 2016-03-02 LAB — I-STAT TROPONIN, ED: TROPONIN I, POC: 0 ng/mL (ref 0.00–0.08)

## 2016-03-02 LAB — LIPASE, BLOOD: Lipase: 59 U/L — ABNORMAL HIGH (ref 11–51)

## 2016-03-02 LAB — MAGNESIUM: Magnesium: 1.4 mg/dL — ABNORMAL LOW (ref 1.7–2.4)

## 2016-03-02 LAB — MRSA PCR SCREENING: MRSA BY PCR: NEGATIVE

## 2016-03-02 MED ORDER — ACETAMINOPHEN 650 MG RE SUPP: 650.0000 mg | Freq: Four times a day (QID) | RECTAL | Status: DC | PRN

## 2016-03-02 MED ORDER — SODIUM CHLORIDE 0.9 % IV SOLN
INTRAVENOUS | Status: DC
  Administered 2016-03-03 (×2): via INTRAVENOUS
  Administered 2016-03-04: 1000 mL via INTRAVENOUS
  Administered 2016-03-04: 150 mL/h via INTRAVENOUS

## 2016-03-02 MED ORDER — HYDROMORPHONE HCL 1 MG/ML IJ SOLN
1.0000 mg | Freq: Once | INTRAMUSCULAR | Status: AC
  Administered 2016-03-02: 1 mg via INTRAVENOUS
  Filled 2016-03-02: qty 1

## 2016-03-02 MED ORDER — ONDANSETRON 4 MG PO TBDP
ORAL_TABLET | ORAL | Status: AC
  Filled 2016-03-02: qty 1

## 2016-03-02 MED ORDER — SODIUM CHLORIDE 0.9 % IV SOLN
500.0000 mg | Freq: Three times a day (TID) | INTRAVENOUS | Status: DC
  Administered 2016-03-02 – 2016-03-03 (×3): 500 mg via INTRAVENOUS
  Filled 2016-03-02 (×6): qty 500

## 2016-03-02 MED ORDER — SODIUM CHLORIDE 0.9 % IV BOLUS (SEPSIS)
1000.0000 mL | Freq: Once | INTRAVENOUS | Status: AC
  Administered 2016-03-02: 1000 mL via INTRAVENOUS

## 2016-03-02 MED ORDER — SODIUM CHLORIDE 0.9 % IV SOLN
1000.0000 mL | INTRAVENOUS | Status: DC
  Administered 2016-03-02: 1000 mL via INTRAVENOUS

## 2016-03-02 MED ORDER — HYDROMORPHONE HCL 1 MG/ML IJ SOLN: 1.0000 mg | Freq: Once | INTRAMUSCULAR | Status: DC

## 2016-03-02 MED ORDER — ENOXAPARIN SODIUM 40 MG/0.4ML ~~LOC~~ SOLN: 40.0000 mg | SUBCUTANEOUS | Status: DC

## 2016-03-02 MED ORDER — SODIUM CHLORIDE 0.9 % IV SOLN
INTRAVENOUS | Status: AC
  Administered 2016-03-02 (×2): via INTRAVENOUS

## 2016-03-02 MED ORDER — ONDANSETRON HCL 4 MG PO TABS: 4.0000 mg | ORAL_TABLET | Freq: Four times a day (QID) | ORAL | Status: DC | PRN

## 2016-03-02 MED ORDER — SODIUM CHLORIDE 0.9 % IV SOLN
500.0000 mg | Freq: Once | INTRAVENOUS | Status: AC
  Administered 2016-03-02: 500 mg via INTRAVENOUS
  Filled 2016-03-02: qty 500

## 2016-03-02 MED ORDER — PIPERACILLIN-TAZOBACTAM 3.375 G IVPB 30 MIN: 3.3750 g | Freq: Once | INTRAVENOUS | Status: DC

## 2016-03-02 MED ORDER — ONDANSETRON HCL 4 MG/2ML IJ SOLN
4.0000 mg | Freq: Four times a day (QID) | INTRAMUSCULAR | Status: DC | PRN
  Administered 2016-03-02 – 2016-03-04 (×2): 4 mg via INTRAVENOUS
  Filled 2016-03-02 (×2): qty 2

## 2016-03-02 MED ORDER — TRAZODONE HCL 50 MG PO TABS: 25.0000 mg | ORAL_TABLET | Freq: Every evening | ORAL | Status: DC | PRN

## 2016-03-02 MED ORDER — POTASSIUM CHLORIDE 10 MEQ/100ML IV SOLN
10.0000 meq | INTRAVENOUS | Status: AC
  Administered 2016-03-02 (×4): 10 meq via INTRAVENOUS
  Filled 2016-03-02 (×4): qty 100

## 2016-03-02 MED ORDER — BISACODYL 10 MG RE SUPP: 10.0000 mg | Freq: Every day | RECTAL | Status: DC | PRN

## 2016-03-02 MED ORDER — PIPERACILLIN-TAZOBACTAM 3.375 G IVPB: 3.3750 g | Freq: Three times a day (TID) | INTRAVENOUS | Status: DC

## 2016-03-02 MED ORDER — HYDROMORPHONE HCL 1 MG/ML IJ SOLN
1.0000 mg | INTRAMUSCULAR | Status: DC | PRN
  Administered 2016-03-02 – 2016-03-03 (×3): 1 mg via INTRAVENOUS
  Filled 2016-03-02 (×3): qty 1

## 2016-03-02 MED ORDER — ACETAMINOPHEN 325 MG PO TABS
650.0000 mg | ORAL_TABLET | Freq: Four times a day (QID) | ORAL | Status: DC | PRN
  Administered 2016-03-03 – 2016-03-06 (×2): 650 mg via ORAL
  Filled 2016-03-02 (×2): qty 2

## 2016-03-02 MED ORDER — PIPERACILLIN-TAZOBACTAM 3.375 G IVPB 30 MIN
3.3750 g | Freq: Once | INTRAVENOUS | Status: AC
  Administered 2016-03-02: 3.375 g via INTRAVENOUS
  Filled 2016-03-02: qty 50

## 2016-03-02 MED ORDER — ONDANSETRON 4 MG PO TBDP
4.0000 mg | ORAL_TABLET | Freq: Once | ORAL | Status: AC
  Administered 2016-03-02: 4 mg via ORAL

## 2016-03-02 MED ORDER — LEVOTHYROXINE SODIUM 100 MCG IV SOLR
25.0000 ug | Freq: Every day | INTRAVENOUS | Status: DC
  Administered 2016-03-02 – 2016-03-04 (×3): 25 ug via INTRAVENOUS
  Filled 2016-03-02 (×3): qty 5

## 2016-03-02 MED ORDER — IOPAMIDOL (ISOVUE-300) INJECTION 61%
INTRAVENOUS | Status: AC
  Administered 2016-03-02: 100 mL via INTRAVENOUS
  Filled 2016-03-02: qty 100

## 2016-03-02 NOTE — ED Provider Notes (Signed)
CSN: [id number]     Arrival date & time 03/02/16  0019 History   First MD Initiated Contact with Patient 03/02/16 [phone]     Chief Complaint  Patient presents with  . Shortness of Breath  . Chest Pain  . Abdominal Pain     (Consider location/radiation/quality/duration/timing/severity/associated sxs/prior Treatment) The history is provided by the patient.     Patient with hx remote cholecystectomy p/w right sided abdominal pain and right flank pain that began abruptly at 8pm with associated N/V.  Pain is sharp and constant, worse with palpation and certain movements/positions.  Has vomited 3 times, no blood.  Denies any urinary or bowel symptoms.  Has chronic constipation, last BM was two days ago and normal.  Denies CP, SOB, cough.  Denies recent travel, sick contacts, food changes, med changes.  No falls or trauma, no heavy lifting.    Past Medical History  Diagnosis Date  . Anxiety   . GERD (gastroesophageal reflux disease)   . Ductal carcinoma in situ (DCIS) of left breast   12/08/14    Left Upper Outer Quadrant  . Heart murmur     baby  . Depression   . Chronic kidney disease     released from [facility] feb 16 states blood found in urine in past-tests done but could not find reason for blood  . PONV (postoperative nausea and vomiting)     with cataract surgery and ist lumpectomy req patch and iv med for nausea  . Cancer [facility])     breast, left   Past Surgical History  Procedure Laterality Date  . Cataracts l eye Left   . Cataract extraction    . Dental surgery      rt upper tooth removed  and crown? placed  . Cholecystectomy  09/17/2011    Procedure: LAPAROSCOPIC CHOLECYSTECTOMY WITH INTRAOPERATIVE CHOLANGIOGRAM;  Surgeon: [name], MD;  Location: [place];  Service: General;  Laterality: N/A;  [facility]   . Eye surgery    . Breast lumpectomy with radioactive seed localization Left 01/04/2015    Procedure: LEFT PARTIAL MASTECTOMY WITH RADIOACTIVE SEED LOCALIZATION;  Surgeon:  Fanny Skates, MD;  Location: [place];  Service: General;  Laterality: Left;  . Re-excision of breast lumpectomy Left 01/12/2015    Procedure: LEFT  BREAST LUMPECTOMY RE-EXCISION OF MARGINS;  Surgeon: Fanny Skates, MD;  Location: [place];  Service: General;  Laterality: Left;  . Left breast biopsy  12/08/14  . Mastectomy w/ sentinel node biopsy Left 04/07/2015    nipple sparing   . Nipple sparing mastectomy/sentinal lymph node biopsy/reconstruction/placement of tissue expander Left 04/07/2015    Procedure: LEFT NIPPLE SPARING MASTECTOMY WITH LEFT SENTINAL LYMPH NODE BIOPSY AND  RECONSTRUCTION WITH PLACEMENT OF TISSUE EXPANDER;  Surgeon: Fanny Skates, MD;  Location: [place];  Service: General;  Laterality: Left;  . Breast reconstruction with placement of tissue expander and flex hd (acellular hydrated dermis) Left 04/07/2015    Procedure: IMMEDIATE LEFT BREAST RECONSTRUCTION WITH PLACEMENT SALINE IMPLANT AND CHEST WALL RECONSTRUCTION WITH  ACELLULAR DERMAL MATRIX ;  Surgeon: Crissie Reese, MD;  Location: [place];  Service: Plastics;  Laterality: Left;  . Hysteroscopy w/d&c N/A 10/28/2015    Procedure: DILATATION AND CURETTAGE /HYSTEROSCOPY, Myosure;  Surgeon: Alden Hipp, MD;  Location: [facility];  Service: Gynecology;  Laterality: N/A;   Family History  Problem Relation Age of Onset  . Heart disease Mother   . Hypertension Mother   . Leukemia Father   .  Heart disease Father   . Breast cancer Maternal Aunt   . Colon cancer      Father's aunt  . Cancer Brother    Social History  Substance Use Topics  . Smoking status: Former Smoker -- 0.50 packs/day for 15 years    Types: Cigarettes    Quit date: 11/02/2014  . Smokeless tobacco: Never Used  . Alcohol Use: 0.6 oz/week    1 Glasses of wine per week     Comment: occassional   OB History    Gravida Para Term Preterm AB TAB SAB Ectopic Multiple Living   1 1              Obstetric Comments   She menarched at early age of 40 and  went to menopause at age 31  She had one pregnancy, her first child was born at age 45 BC x 2-3 years  She had used IUD over 10 years She was never exposed to fertility medications or hormone replacement therapy       Review of Systems  All other systems reviewed and are negative.     Allergies  Other  Home Medications   Prior to Admission medications   Medication Sig Start Date End Date Taking? Authorizing Provider  aspirin 81 MG chewable tablet Chew 81 mg by mouth daily.    Historical Provider, MD  Biotin 5000 MCG TABS Take 1 tablet by mouth daily.    Historical Provider, MD  Calcium Carbonate-Vitamin D (CALCIUM 600 + D PO) Take 2 tablets by mouth daily.      Historical Provider, MD  Cholecalciferol (VITAMIN D) 2000 units tablet Take 2,000 Units by mouth daily.    Historical Provider, MD  docusate sodium (COLACE) 100 MG capsule Take 1 capsule (100 mg total) by mouth daily. 04/08/15   Crissie Reese, MD  esomeprazole (NEXIUM) 20 MG capsule Take 20 mg by mouth daily as needed (For heartburn.).     Historical Provider, MD  Multiple Vitamins-Minerals (CENTRUM SILVER PO) Take 1 tablet by mouth daily.     Historical Provider, MD  tamoxifen (NOLVADEX) 20 MG tablet Take 1 tablet (20 mg total) by mouth daily. 04/25/15   Nicholas Lose, MD   BP 150/74 mmHg  Pulse 104  Temp(Src) 98.8 F (37.1 C) (Oral)  Resp 20  Ht 5\' 3"  (1.6 m)  Wt 90.719 kg  BMI 35.44 kg/m2  SpO2 96% Physical Exam  Constitutional: She appears well-developed and well-nourished. No distress.  HENT:  Head: Normocephalic and atraumatic.  Neck: Neck supple.  Cardiovascular: Normal rate and regular rhythm.   Pulmonary/Chest: Effort normal and breath sounds normal. No respiratory distress. She has no wheezes. She has no rales.  Abdominal: Soft. She exhibits no distension. There is tenderness in the right upper quadrant, right lower quadrant and left upper quadrant. There is CVA tenderness (right). There is no rebound and  no guarding.  Neurological: She is alert.  Skin: She is not diaphoretic.  Nursing note and vitals reviewed.   ED Course  Procedures (including critical care time) Labs Review Labs Reviewed  BASIC METABOLIC PANEL - Abnormal; Notable for the following:    Potassium 3.1 (*)    Chloride 98 (*)    Glucose, Bld 138 (*)    All other components within normal limits  CBC - Abnormal; Notable for the following:    WBC 18.6 (*)    All other components within normal limits  URINALYSIS, ROUTINE W REFLEX MICROSCOPIC (NOT AT [facility]) -  Abnormal; Notable for the following:    Color, Urine AMBER (*)    APPearance CLOUDY (*)    Hgb urine dipstick LARGE (*)    Ketones, ur 15 (*)    Leukocytes, UA TRACE (*)    All other components within normal limits  URINE MICROSCOPIC-ADD ON - Abnormal; Notable for the following:    Squamous Epithelial / LPF 6-30 (*)    Bacteria, UA RARE (*)    Crystals CA OXALATE CRYSTALS (*)    All other components within normal limits  I-STAT CG4 LACTIC ACID, ED - Abnormal; Notable for the following:    Lactic Acid, Venous 4.37 (*)    All other components within normal limits  CULTURE, BLOOD (ROUTINE X 2)  CULTURE, BLOOD (ROUTINE X 2)  URINE CULTURE  LIPASE, BLOOD  HEPATIC FUNCTION PANEL  I-STAT TROPOININ, ED    Imaging Review Dg Chest 2 View  03/02/2016  CLINICAL DATA:  Chest pain and dyspnea. EXAM: CHEST  2 VIEW COMPARISON:  12/31/2014 FINDINGS: The heart size and mediastinal contours are within normal limits. Both lungs are clear. The visualized skeletal structures are unremarkable. IMPRESSION: No active cardiopulmonary disease. Electronically Signed   By: Andreas Newport M.D.   On: 03/02/2016 01:25   I have personally reviewed and evaluated these images and lab results as part of my medical decision-making.   EKG Interpretation   Date/Time:  Friday March 02 2016 00:26:27 EDT Ventricular Rate:  102 PR Interval:  132 QRS Duration: 82 QT Interval:  352 QTC  Calculation: 458 R Axis:   66 Text Interpretation:  Sinus tachycardia Nonspecific ST abnormality  Abnormal ECG artifact noted No significant change since last tracing  Confirmed by Christy Gentles  MD, Elenore Rota ([id number]) on 03/02/2016 4:56:28 AM          MDM   Final diagnoses:  Right upper quadrant pain  Severe sepsis ([place])    Pt with sudden onset right abdomen/right flank pain with N/V that began around 8pm on 6/29, significant tenderness on exam. Discussed pt with Dr Christy Gentles who also saw patient.   Pt afebrile and normal vital signs on arrival, became tachycardic.  WBC elevated (18), lactic acid elevated (4).  Sepsis protocol initiated with fluid bolus, antibiotics.  CT abd/pelvis demonstrates pancreatitis per phone call report, pending full written report.   Unfortunately labs ordered prior to my seeing pt did not include hepatic function and lipase and I was not previously aware of this.  Hepatic function and lipase ordered.  Pt signed out to [facility], PA-C, who assumes care of patient pending remainder of workup and admission.     [place], PA-C 03/02/16 0745  Ripley Fraise, MD 03/03/16 [phone]

## 2016-03-02 NOTE — ED Notes (Signed)
Pt comes to ED c/o chest pain, shortness of breath, nausea and emesis.

## 2016-03-02 NOTE — Progress Notes (Addendum)
Pharmacy Antibiotic Note  [name] is a 65 y.o. female admitted on 03/02/2016 with intra-abdominal infection.  Pharmacy has been consulted for imipenem dosing. Received 1x dose of Zosyn at ~0600 and imipenem at ~1000 in the ED. AF, wbc 18.6, SCr 0.9 on admit, CrCl~65.  Plan: Imipenem 500mg  IV q8h - start at 1800 Monitor clinical progress, c/s, renal function, abx plan/LOT  Height: 5\' 3"  (160 cm) Weight: 200 lb (90.719 kg) IBW/kg (Calculated) : 52.4  Temp (24hrs), Avg:98.3 F (36.8 C), Min:97.7 F (36.5 C), Max:98.8 F (37.1 C)   Recent Labs Lab 03/02/16 0041 03/02/16 0547 03/02/16 0913  WBC 18.6*  --   --   CREATININE 0.90  --   --   LATICACIDVEN  --  4.37* 2.11*    Estimated Creatinine Clearance: 65.7 mL/min (by C-G formula based on Cr of 0.9).    Allergies  Allergen Reactions  . Other Itching and Rash    Patient states that she was unable to use supportive wrap they provided at [facility] following a breast removal and reconstruction.    Antimicrobials this admission: 6/30 zosyn x1 6/30 imipenem >>  Dose adjustments this admission:   Microbiology results: 6/30 BCx:  6/30 UCx:     Elicia Lamp, PharmD, [facility] Clinical Pharmacist Pager [phone] 03/02/2016 10:16 AM

## 2016-03-02 NOTE — ED Provider Notes (Signed)
Patient seen/examined in the Emergency Department in conjunction with Midlevel Provider [facility] Patient reports diffuse abd pain Exam : awake/alert, moderate RUQ/LUQ/epigastric tenderness Plan: CT imaging advised    Ripley Fraise, MD 03/02/16 [phone]

## 2016-03-02 NOTE — ED Notes (Signed)
Called report to [place] rm 16. Report called to [place].

## 2016-03-02 NOTE — ED Provider Notes (Signed)
Care assumed from [facility], [place].  [name] is a 65 y.o. female presents with R side/flank pain.  Now with emesis.  Very tender on exam.  Leukocytosis and elevated lactic acid.  Now tachycardic.  Sepsis protocol initiated.  H/o cholecystectomy.  Pt has gotten Zosyn and fluids.    PCP: ED Ny[facility] Cowden [facility]  Physical Exam  BP 134/63 mmHg  Pulse 103  Temp(Src) 98.8 F (37.1 C) (Oral)  Resp 19  Ht 5\' 3"  (1.6 m)  Wt 90.719 kg  BMI 35.44 kg/m2  SpO2 97%  Physical Exam   Face to face Exam:   General: Awake, ill appearing  HEENT: Atraumatic  Resp: Normal effort  Abd: Distended, epigastric tenderness with guarding and rebound Neuro:No focal weakness  Lymph: No adenopathy  Sepsis - Repeat Assessment  Performed at:    7:35 AM   Vitals     Blood pressure 134/63, pulse 103, temperature 98.8 F (37.1 C), temperature source Oral, resp. rate 19, height 5\' 3"  (1.6 m), weight 90.719 kg, SpO2 97 %.  Heart:     Tachycardic, regular rhythm  Lungs:    CTA  Capillary Refill:   <2 sec  Peripheral Pulse:   Radial pulse palpable  Skin:     Pale    CRITICAL CARE Performed by: Abigail Butts Total critical care time: 45 minutes Critical care time was exclusive of separately billable procedures and treating other patients. Critical care was necessary to treat or prevent imminent or life-threatening deterioration. Critical care was time spent personally by me on the following activities: development of treatment plan with patient and/or surrogate as well as nursing, discussions with consultants, evaluation of patient's response to treatment, examination of patient, obtaining history from patient or surrogate, ordering and performing treatments and interventions, ordering and review of laboratory studies, ordering and review of radiographic studies, pulse oximetry and re-evaluation of patient's condition.   ED Course  Procedures  1. Right upper quadrant pain    2. Severe sepsis ([place])   3. Other acute pancreatitis   4. Hypokalemia   5. Elevated lipase     MDM  Plan: admit for sepsis after CT results  7:40 AM CT with evidence of pancreatitis.  LFTs and Lipase pending.  Pt will need critical care consult.    9:06 AM Elevated lipase at 59, no elevation in AST/ALT.    9:21 AM Discussed with Dr. Lamonte Sakai who reviewed the case and is comfortable with a step down bed. Repeat lactic is 2.11.  No hypotension.  9:32 AM Discussed with [facility] who will admit.     Jarrett Soho Chord Takahashi, PA-C 03/02/16 [place], MD 03/03/16 [phone]

## 2016-03-02 NOTE — H&P (Signed)
History and Physical    [name] [id number] DOB: 02-26-1951 DOA: 03/02/2016  PCP: [name], MD  Patient coming from:   home    Chief Complaint:  abdominal pain, nausea, vomiting.   HPI: [name] is a 65 y.o. female with medical history significant for but not limited to,  left breast cancer, depression, and GERD. Last night around 7pm patient suddently developed N/V and epigastric pain radiating around RUQ to right mid back. Pain was constant, unrelated to position. Patient has never had this type pain before. No diarrhea. No chills at home but some upon arrival to ED. Pain better at present after pain meds. In normal state of health until last evening. CT scan reveals pancreatitis. Patient rarely drinks ETOH. She did recently start BP med.    ED Course:  Mild tachycardia, VSS o/w stable. Afeb.  WBC 18, lipase minimally elevated.  HCt 43, normal renal function.  Lactic acid 4.37, down to 2.11 after 3 liter bolus No acute findings on chest x-ray CTscan  >>> pancreatitis  Review of Systems: As per HPI, otherwise 10 point review of systems negative.    Past Medical History  Diagnosis Date  . Anxiety   . GERD (gastroesophageal reflux disease)   . Ductal carcinoma in situ (DCIS) of left breast   12/08/14    Left Upper Outer Quadrant  . Heart murmur     baby  . Depression   . Chronic kidney disease     released from [facility]b 16 states blood found in urine in past-tests done but could not find reason for blood  . PONV (postoperative nausea and vomiting)     with cataract surgery and ist lumpectomy req patch and iv med for nausea  . Cancer [facility])     breast, left    Past Surgical History  Procedure Laterality Date  . Cataracts l eye Left   . Cataract extraction    . Dental surgery      rt upper tooth removed  and crown? placed  . Cholecystectomy  09/17/2011    Procedure: LAPAROSCOPIC CHOLECYSTECTOMY WITH INTRAOPERATIVE CHOLANGIOGRAM;  Surgeon: [name], MD;  Location: [place];  Service: General;  Laterality: N/A;  carm   . Eye surgery    . Breast lumpectomy with radioactive seed localization Left 01/04/2015    Procedure: LEFT PARTIAL MASTECTOMY WITH RADIOACTIVE SEED LOCALIZATION;  Surgeon: Fanny Skates, MD;  Location: [place];  Service: General;  Laterality: Left;  . Re-excision of breast lumpectomy Left 01/12/2015    Procedure: LEFT  BREAST LUMPECTOMY RE-EXCISION OF MARGINS;  Surgeon: Fanny Skates, MD;  Location: [place];  Service: General;  Laterality: Left;  . Left breast biopsy  12/08/14  . Mastectomy w/ sentinel node biopsy Left 04/07/2015    nipple sparing   . Nipple sparing mastectomy/sentinal lymph node biopsy/reconstruction/placement of tissue expander Left 04/07/2015    Procedure: LEFT NIPPLE SPARING MASTECTOMY WITH LEFT SENTINAL LYMPH NODE BIOPSY AND  RECONSTRUCTION WITH PLACEMENT OF TISSUE EXPANDER;  Surgeon: Fanny Skates, MD;  Location: [place];  Service: General;  Laterality: Left;  . Breast reconstruction with placement of tissue expander and flex hd (acellular hydrated dermis) Left 04/07/2015    Procedure: IMMEDIATE LEFT BREAST RECONSTRUCTION WITH PLACEMENT SALINE IMPLANT AND CHEST WALL RECONSTRUCTION WITH  ACELLULAR DERMAL MATRIX ;  Surgeon: Crissie Reese, MD;  Location: [place];  Service: Plastics;  Laterality: Left;  . Hysteroscopy w/d&c N/A 10/28/2015  Procedure: DILATATION AND CURETTAGE /HYSTEROSCOPY, Myosure;  Surgeon: Alden Hipp, MD;  Location: [facility];  Service: Gynecology;  Laterality: N/A;    Social History   Social History  . Marital Status: Divorced    Spouse Name: N/A  . Number of Children: 1  . Years of Education: N/A   Occupational History  . Retired    Social History Main Topics  . Smoking status: Former Smoker -- 0.50 packs/day for 15 years    Types: Cigarettes    Quit date: 11/02/2014  . Smokeless tobacco: Never Used  . Alcohol Use: 0.6 oz/week    1 Glasses of wine per week      Comment: occassional  . Drug Use: No  . Sexual Activity: Not on file     Comment: Did not ask   Other Topics Concern  . Not on file   Social History Narrative  Lives at home. Her mother resides with her. No assistive devices needed for ambulation  Allergies  Allergen Reactions  . Other Itching and Rash    Patient states that she was unable to use supportive wrap they provided at [facility] following a breast removal and reconstruction.    Family History  Problem Relation Age of Onset  . Heart disease Mother   . Hypertension Mother   . Leukemia Father   . Heart disease Father   . Breast cancer Maternal Aunt   . Colon cancer      Father's aunt  . Cancer Brother     Prior to Admission medications   Medication Sig Start Date End Date Taking? Authorizing Provider  aspirin 81 MG chewable tablet Chew 81 mg by mouth daily.   Yes Historical Provider, MD  Biotin 5000 MCG TABS Take 1 tablet by mouth daily.   Yes Historical Provider, MD  Calcium Carbonate-Vitamin D (CALCIUM 600 + D PO) Take 2 tablets by mouth daily.     Yes Historical Provider, MD  Cholecalciferol (VITAMIN D) 2000 units tablet Take 2,000 Units by mouth daily.   Yes Historical Provider, MD  esomeprazole (NEXIUM) 20 MG capsule Take 20 mg by mouth daily as needed (For heartburn.).    Yes Historical Provider, MD  hydrochlorothiazide (HYDRODIURIL) 25 MG tablet Take 25 mg by mouth daily. 12/29/15  Yes Historical Provider, MD  levothyroxine (SYNTHROID, LEVOTHROID) 50 MCG tablet Take 50 mcg by mouth daily. 12/29/15  Yes Historical Provider, MD  Multiple Vitamins-Minerals (CENTRUM SILVER PO) Take 1 tablet by mouth daily.    Yes Historical Provider, MD  tamoxifen (NOLVADEX) 20 MG tablet Take 1 tablet (20 mg total) by mouth daily. 04/25/15  Yes Nicholas Lose, MD  venlafaxine XR (EFFEXOR-XR) 75 MG 24 hr capsule Take 75 mg by mouth daily. 12/29/15  Yes Historical Provider, MD  docusate sodium (COLACE) 100 MG capsule Take 1 capsule (100 mg  total) by mouth daily. Patient not taking: Reported on 03/02/2016 04/08/15   Crissie Reese, MD    Physical Exam: Filed Vitals:   03/02/16 0530 03/02/16 0735 03/02/16 0800 03/02/16 0924  BP: 134/63 145/76 138/84 124/61  Pulse: 103 102 92 86  Temp:      TempSrc:      Resp: 19 20 23 16   Height:      Weight:      SpO2: 97% 100% 93% 94%    Constitutional:  Pleasant white well developed female in NAD, calm, comfortable Filed Vitals:   03/02/16 0530 03/02/16 0735 03/02/16 0800 03/02/16 0924  BP: 134/63 145/76 138/84  124/61  Pulse: 103 102 92 86  Temp:      TempSrc:      Resp: 19 20 23 16   Height:      Weight:      SpO2: 97% 100% 93% 94%   Eyes: PER, lids and conjunctivae normal ENMT: Mucous membranes are moist. Posterior pharynx clear of any exudate or lesions.Normal dentition.  Neck: normal, supple, no masses Respiratory: clear to auscultation bilaterally, no wheezing, no crackles. Normal respiratory effort. No accessory muscle use.  Cardiovascular: Regular rate and rhythm, no murmurs / rubs / gallops. 2-+ BLE edema, 2+ pedal pulses. No carotid bruits.  Abdomen: Soft, nondistended, diffuse upper and right mid abdominal tenderness , hypoactive bowel sounds .  Musculoskeletal: no clubbing / cyanosis. No joint deformity upper and lower extremities. Good ROM, no contractures. Normal muscle tone.  Skin: no rashes, lesions, ulcers.  Neurologic: CN 2-12 grossly intact. Sensation intact, Strength 5/5 in all 4.  Psychiatric: Normal judgment and insight. Alert and oriented x 3. Normal mood.    Labs on Admission: I have personally reviewed following labs and imaging studies  Urine analysis:    Component Value Date/Time   COLORURINE AMBER* 03/02/2016 0507   APPEARANCEUR CLOUDY* 03/02/2016 0507   LABSPEC 1.025 03/02/2016 0507   PHURINE 5.5 03/02/2016 0507   GLUCOSEU NEGATIVE 03/02/2016 0507   HGBUR LARGE* 03/02/2016 0507   BILIRUBINUR NEGATIVE 03/02/2016 0507   KETONESUR 15* 03/02/2016  0507   PROTEINUR NEGATIVE 03/02/2016 0507   UROBILINOGEN 0.2 09/14/2011 1043   NITRITE NEGATIVE 03/02/2016 0507   LEUKOCYTESUR TRACE* 03/02/2016 0507    Radiological Exams on Admission: Dg Chest 2 View  03/02/2016  CLINICAL DATA:  Chest pain and dyspnea. EXAM: CHEST  2 VIEW COMPARISON:  12/31/2014 FINDINGS: The heart size and mediastinal contours are within normal limits. Both lungs are clear. The visualized skeletal structures are unremarkable. IMPRESSION: No active cardiopulmonary disease. Electronically Signed   By: Andreas Newport M.D.   On: 03/02/2016 01:25   Ct Abdomen Pelvis W Contrast  03/02/2016  CLINICAL DATA:  Severe epigastric pain with nausea and vomiting. EXAM: CT ABDOMEN AND PELVIS WITH CONTRAST TECHNIQUE: Multidetector CT imaging of the abdomen and pelvis was performed using the standard protocol following bolus administration of intravenous contrast. CONTRAST:  100 ISOVUE-300 IOPAMIDOL (ISOVUE-300) INJECTION 61% COMPARISON:  CT of the abdomen and pelvis 10/19/2011 FINDINGS: Lower chest: Mild dependent atelectasis is present in the lungs bilaterally. The heart size is normal. A left breast prosthesis is noted. Hepatobiliary: Mild intra and extrahepatic biliary dilation is present following cholecystectomy. No discrete hepatic lesions are present. Pancreas: There is marked inflammatory change at the head of pancreas. No discrete mass lesion is present. The more distal pancreas is unremarkable. The tail the pancreas is markedly atrophic as previously seen. There is gas in the head of the pancreas with potential early pseudocyst formation. Spleen: Within normal limits. Adrenals/Urinary Tract: The adrenal glands are normal bilaterally. Kidneys and ureters are unremarkable. The urinary bladder is within normal limits. Stomach/Bowel: A small hiatal hernia is present. The stomach otherwise unremarkable. Secondary inflammatory changes are present in the second-third portions of the duodenum.  The small bowel is within normal limits. A small amount of free fluid is noted in the retroperitoneum on the right. The appendix is visualized and normal. The ascending and transverse colon are within normal limits. The descending and rectosigmoid colon are unremarkable. Vascular/Lymphatic: An 8 mm short axis peripancreatic lymph node is present. Subcentimeter portacaval nodes are  present. No other significant adenopathy is present. Atherosclerotic changes are noted in the descending abdominal aorta and branch vessels. Reproductive: Calcifications in the anterior fundus of the uterus are compatible with a fibroid. There is some fluid in the endometrial canal, atypical for age. No discrete mass lesion is present. The adnexa are within normal limits for age. Other: No other significant layering intraperitoneal free fluid is present. Musculoskeletal: A vacuum disc is present at L5-S1. Vertebral bodies are otherwise normal aligned. No other focal disc disease is present. IMPRESSION: 1. Diffuse inflammatory changes within the head of the pancreas compatible with acute pancreatitis. 2. Early pseudocyst formation suggested with areas of fluid and gas. No discrete measurable abscess is present. 3. Reactive sized peripancreatic lymph nodes. 4. Small hiatal hernia. 5. Layering fluid in the retroperitoneum on the right. 6. Uterine calcification, likely fibroid. 7. A small amount of fluid is present within the uterus, atypical for age. Please correlate with previous endometrial biopsy. These results were called by telephone at the time of interpretation on 03/02/2016 at 7:37 am to Dr. Christy Gentles , who verbally acknowledged these results. Electronically Signed   By: San Morelle M.D.   On: 03/02/2016 07:40    EKG: Independently reviewed.   EKG Interpretation  Date/Time:  Friday March 02 2016 00:26:27 EDT Ventricular Rate:  102 PR Interval:  132 QRS Duration: 82 QT Interval:  352 QTC Calculation: 458 R  Axis:   66 Text Interpretation:  Sinus tachycardia Nonspecific ST abnormality Abnormal ECG artifact noted No significant change since last tracing Confirmed by Christy Gentles  MD, Elenore Rota ([id number]) on 03/02/2016 4:56:28 AM Also confirmed by Christy Gentles  MD, DONALD ([id number]), editor [place]  CCT, BEVERLY ([id number])  on 03/02/2016 7:23:12 AM       Assessment/Plan   Principal Problem:   Pancreatitis Active Problems:   Hypokalemia   Sepsis ([place])   GERD (gastroesophageal reflux disease)   Breast cancer, left breast (HCC)   Depression   Hypothyroidism     Acute pancreatitis with possible early pseudocyst formation / sepsis  manifested by lactic acid 4.37, CBC 18,000, tachycardia . Etiology Of acute pancreatitis? Possibly secondary to HCTZ. Rare EtOH use. History cholecystectomy, mild intra-, extra hepatic biliary duct dilation on imaging but normal LFTs.  -admit to Stepdown -IV fluid resuscitation completed in ED with total of 3 L. Maintenance fluid 150 ml /hr in progress -Continue to trend lactic acid level. Level has decreased by half following IV fluids -Follow up on blood and urine cultures -lipid panel -Continue Primaxin -Nothing by mouth except for ice chips -Analgesics, antiemetics -Depending on clinical course, consider GI evaluation.   Hypokalemia, on diuretic at home. EDP ordered 4 runs of K+.  Will recheck K+ in am.  Check Mg+  Level.   Hypothyroidism.  IV Synthroid for now, change to by mouth when able  Depression. Stable. Continue home effects or when tolerating by mouth  GERD. Stable. -IV PPI not available given the initial shortage.-IV Pepcid twice a day  History left breast cancer -Resume on tamoxifen when taking by mouth          Hx of breast cancer. Left lumpectomy 01/04/2015 followed later by left mastectomy, lymph nodes negative. Stage N0, stage 0. On Tamoxifen  DVT prophylaxis:   Lovenox  Code Status:   Full code  Family Communication:    None  Disposition Plan:  Discharge home in 3-4 days              Consults called:  EDP  spoke with PCCM over phone. They recommended Stepdown bed but not planning to see patient unless consult needed.  Admission status: Admit to [place] NP [facility] Pager [phone]  If 7PM-7AM, please contact night-coverage [email or web address] Password [facility]  03/02/2016, 9:37 AM

## 2016-03-03 DIAGNOSIS — F329 Major depressive disorder, single episode, unspecified: Secondary | ICD-10-CM

## 2016-03-03 DIAGNOSIS — C50912 Malignant neoplasm of unspecified site of left female breast: Secondary | ICD-10-CM

## 2016-03-03 DIAGNOSIS — K859 Acute pancreatitis without necrosis or infection, unspecified: Secondary | ICD-10-CM

## 2016-03-03 DIAGNOSIS — A419 Sepsis, unspecified organism: Principal | ICD-10-CM

## 2016-03-03 DIAGNOSIS — R6520 Severe sepsis without septic shock: Secondary | ICD-10-CM

## 2016-03-03 DIAGNOSIS — E039 Hypothyroidism, unspecified: Secondary | ICD-10-CM

## 2016-03-03 DIAGNOSIS — E876 Hypokalemia: Secondary | ICD-10-CM

## 2016-03-03 DIAGNOSIS — K219 Gastro-esophageal reflux disease without esophagitis: Secondary | ICD-10-CM

## 2016-03-03 LAB — BASIC METABOLIC PANEL
ANION GAP: 7 (ref 5–15)
BUN: 8 mg/dL (ref 6–20)
CALCIUM: 7.8 mg/dL — AB (ref 8.9–10.3)
CO2: 25 mmol/L (ref 22–32)
CREATININE: 0.78 mg/dL (ref 0.44–1.00)
Chloride: 110 mmol/L (ref 101–111)
Glucose, Bld: 120 mg/dL — ABNORMAL HIGH (ref 65–99)
Potassium: 3.9 mmol/L (ref 3.5–5.1)
SODIUM: 142 mmol/L (ref 135–145)

## 2016-03-03 LAB — LACTIC ACID, PLASMA: LACTIC ACID, VENOUS: 1.1 mmol/L (ref 0.5–1.9)

## 2016-03-03 LAB — CBC
HCT: 34.7 % — ABNORMAL LOW (ref 36.0–46.0)
HEMOGLOBIN: 11.2 g/dL — AB (ref 12.0–15.0)
MCH: 31.2 pg (ref 26.0–34.0)
MCHC: 32.3 g/dL (ref 30.0–36.0)
MCV: 96.7 fL (ref 78.0–100.0)
PLATELETS: 227 10*3/uL (ref 150–400)
RBC: 3.59 MIL/uL — AB (ref 3.87–5.11)
RDW: 14 % (ref 11.5–15.5)
WBC: 13.3 10*3/uL — AB (ref 4.0–10.5)

## 2016-03-03 LAB — URINE CULTURE

## 2016-03-03 LAB — MAGNESIUM: MAGNESIUM: 1.6 mg/dL — AB (ref 1.7–2.4)

## 2016-03-03 MED ORDER — MAGNESIUM SULFATE 4 GM/100ML IV SOLN
4.0000 g | Freq: Once | INTRAVENOUS | Status: AC
  Administered 2016-03-03: 4 g via INTRAVENOUS
  Filled 2016-03-03: qty 100

## 2016-03-03 MED ORDER — SODIUM CHLORIDE 0.9 % IV SOLN
500.0000 mg | Freq: Four times a day (QID) | INTRAVENOUS | Status: DC
  Administered 2016-03-03 – 2016-03-05 (×7): 500 mg via INTRAVENOUS
  Filled 2016-03-03 (×10): qty 500

## 2016-03-03 MED ORDER — SENNOSIDES-DOCUSATE SODIUM 8.6-50 MG PO TABS
2.0000 | ORAL_TABLET | Freq: Two times a day (BID) | ORAL | Status: DC
  Administered 2016-03-03 – 2016-03-04 (×3): 2 via ORAL
  Filled 2016-03-03 (×3): qty 2

## 2016-03-03 NOTE — Progress Notes (Addendum)
PROGRESS NOTE    [name]  [id number] DOB: 02/10/1951 DOA: 03/02/2016 PCP: [name], MD   Brief Narrative:  [name] is a 65 y.o. female with medical history significant for but not limited to, left breast cancer, depression, and GERD. Last night around 7pm patient suddently developed N/V and epigastric pain radiating around RUQ to right mid back. Pain was constant, unrelated to position. Patient has never had this type pain before. No diarrhea. No chills at home but some upon arrival to ED. Pain better at present after pain meds. In normal state of health until last evening. CT scan reveals pancreatitis. Patient rarely drinks ETOH. She did recently start BP med.   Assessment & Plan:   Principal Problem:   Sepsis ([place]) Active Problems:   Pancreatitis   GERD (gastroesophageal reflux disease)   Breast cancer, left breast (HCC)   Hypokalemia   Depression   Hypothyroidism  #1 sepsis secondary to acute pancreatitis with early pseudocyst formation Patient admitted with nausea vomiting abdominal pain CT abdomen and pelvis consistent with an acute pancreatitis with early pseudocyst formation. Asymmetric criteria for sepsis with elevated lactic acid level of 4.37, leukocytosis with a white count of 18,000, tachycardic with borderline blood pressure. Patient currently afebrile. Leukocytosis trending down. Lactic acid level trending down. Blood cultures pending. Continue empiric IV Primaxin. Bowel rest. IV fluids. Pain management. Supportive care.  #2 acute pancreatitis with early pseudocyst formation Per CT abdomen and pelvis. CT abdomen and pelvis with mild intra-next or hepatic biliary dilatation following cholecystectomy. Lipase level was 59 on admission with normal LFTs. Patient with no prior history of pancreatitis. Patient denies any ongoing daily heavy alcohol use. Fasting lipid panel triglycerides of 94. Continue bowel rest, aggressive IV fluid hydration, pain  management, empiric IV Primaxin. Increase IV fluids to NS at 250 mL per hour. Consult with GI for further evaluation and management.  #3 hypokalemia/hypomagnesemia Replete.  #4 hypothyroidism Continue IV Synthroid.  #5 depression Stable. Hold Effexor and when tolerating oral intake will resume.  #6 gastroesophageal reflux disease IV PPI.  #7 history of left breast cancer Tamoxifen on hold and will resume when tolerating oral intake.   DVT prophylaxis: SCDs Code Status: Full Family Communication: Updated patient. No family at bedside. Disposition Plan: Remain the step down unit. Home when medically stable and pancreatitis has resolved and tolerating oral intake.   Consultants:   Gastroenterology pending  Procedures:   CT abdomen and pelvis 03/02/2016  Chest x-ray 03/02/2016  Antimicrobials:   IV Primaxin 03/02/2016   Subjective: Patient denies any shortness of breath. No chest pain. Patient states some improvement with abdominal pain, however still significantly painful. No further emesis on nausea.  Objective: Filed Vitals:   03/02/16 2024 03/03/16 0001 03/03/16 0454 03/03/16 0500  BP: 104/51 114/60 119/57   Pulse: 81 74 88   Temp: 98.1 F (36.7 C) 98.4 F (36.9 C) 98.7 F (37.1 C)   TempSrc: Oral Oral Oral   Resp: 16 19 14    Height:      Weight:    92.1 kg (203 lb 0.7 oz)  SpO2: 93% 93% 98%     Intake/Output Summary (Last 24 hours) at 03/03/16 1003 Last data filed at 03/03/16 0247  Gross per 24 hour  Intake 907.08 ml  Output      0 ml  Net 907.08 ml   Filed Weights   03/02/16 0027 03/02/16 1819 03/03/16 0500  Weight: 90.719 kg (200 lb) 92.987 kg (205  lb) 92.1 kg (203 lb 0.7 oz)    Examination:  General exam: Sleeping. Easily arousable. Respiratory system: Clear to auscultation. Fair air movement. Respiratory effort normal. Cardiovascular system: S1 & S2 heard, RRR. No JVD, murmurs, rubs, gallops or clicks. No pedal edema. Gastrointestinal  system: Abdomen is nondistended, soft and diffusely tender to palpation greater in the epigastric and right upper quadrant regions. No organomegaly or masses felt. Normal bowel sounds heard. Central nervous system: Alert and oriented. No focal neurological deficits. Extremities: Symmetric 5 x 5 power. Skin: No rashes, lesions or ulcers Psychiatry: Judgement and insight appear normal. Mood & affect appropriate.     Data Reviewed: I have personally reviewed following labs and imaging studies  CBC:  Recent Labs Lab 03/02/16 0041 03/03/16 0004  WBC 18.6* 13.3*  HGB 14.1 11.2*  HCT 43.0 34.7*  MCV 95.6 96.7  PLT 285 [id number]   Basic Metabolic Panel:  Recent Labs Lab 03/02/16 0041 03/02/16 1041 03/03/16 0004  NA 137  --  142  K 3.1*  --  3.9  CL 98*  --  110  CO2 25  --  25  GLUCOSE 138*  --  120*  BUN 16  --  8  CREATININE 0.90  --  0.78  CALCIUM 9.7  --  7.8*  MG  --  1.4* 1.6*   GFR: Estimated Creatinine Clearance: 74.6 mL/min (by C-G formula based on Cr of 0.78). Liver Function Tests:  Recent Labs Lab 03/02/16 0756  AST 36  ALT 31  ALKPHOS 49  BILITOT 1.2  PROT 5.5*  ALBUMIN 3.0*    Recent Labs Lab 03/02/16 0756  LIPASE 59*   No results for input(s): AMMONIA in the last 168 hours. Coagulation Profile:  Recent Labs Lab 03/02/16 1032  INR 1.10   Cardiac Enzymes: No results for input(s): CKTOTAL, CKMB, CKMBINDEX, TROPONINI in the last 168 hours. BNP (last 3 results) No results for input(s): PROBNP in the last 8760 hours. HbA1C: No results for input(s): HGBA1C in the last 72 hours. CBG: No results for input(s): GLUCAP in the last 168 hours. Lipid Profile:  Recent Labs  03/02/16 1040  CHOL 138  HDL 53  LDLCALC 66  TRIG 94  CHOLHDL 2.6   Thyroid Function Tests: No results for input(s): TSH, T4TOTAL, FREET4, T3FREE, THYROIDAB in the last 72 hours. Anemia Panel: No results for input(s): VITAMINB12, FOLATE, FERRITIN, TIBC, IRON, RETICCTPCT in  the last 72 hours. Sepsis Labs:  Recent Labs Lab 03/02/16 1031 03/02/16 1032 03/02/16 1221 03/02/16 2118 03/03/16 0004  PROCALCITON  --  0.57  --   --   --   LATICACIDVEN 1.6  --  2.24* 0.9 1.1    Recent Results (from the past 240 hour(s))  Urine culture     Status: Abnormal   Collection Time: 03/02/16  5:59 AM  Result Value Ref Range Status   Specimen Description URINE, RANDOM  Final   Special Requests NONE  Final   Culture MULTIPLE SPECIES PRESENT, SUGGEST RECOLLECTION (A)  Final   Report Status 03/03/2016 FINAL  Final  MRSA PCR Screening     Status: None   Collection Time: 03/02/16  6:09 PM  Result Value Ref Range Status   MRSA by PCR NEGATIVE NEGATIVE Final    Comment:        The GeneXpert MRSA Assay (FDA approved for NASAL specimens only), is one component of a comprehensive MRSA colonization surveillance program. It is not intended to diagnose MRSA infection nor to  guide or monitor treatment for MRSA infections.          Radiology Studies: Dg Chest 2 View  03/02/2016  CLINICAL DATA:  Chest pain and dyspnea. EXAM: CHEST  2 VIEW COMPARISON:  12/31/2014 FINDINGS: The heart size and mediastinal contours are within normal limits. Both lungs are clear. The visualized skeletal structures are unremarkable. IMPRESSION: No active cardiopulmonary disease. Electronically Signed   By: Andreas Newport M.D.   On: 03/02/2016 01:25   Ct Abdomen Pelvis W Contrast  03/02/2016  CLINICAL DATA:  Severe epigastric pain with nausea and vomiting. EXAM: CT ABDOMEN AND PELVIS WITH CONTRAST TECHNIQUE: Multidetector CT imaging of the abdomen and pelvis was performed using the standard protocol following bolus administration of intravenous contrast. CONTRAST:  100 ISOVUE-300 IOPAMIDOL (ISOVUE-300) INJECTION 61% COMPARISON:  CT of the abdomen and pelvis 10/19/2011 FINDINGS: Lower chest: Mild dependent atelectasis is present in the lungs bilaterally. The heart size is normal. A left breast  prosthesis is noted. Hepatobiliary: Mild intra and extrahepatic biliary dilation is present following cholecystectomy. No discrete hepatic lesions are present. Pancreas: There is marked inflammatory change at the head of pancreas. No discrete mass lesion is present. The more distal pancreas is unremarkable. The tail the pancreas is markedly atrophic as previously seen. There is gas in the head of the pancreas with potential early pseudocyst formation. Spleen: Within normal limits. Adrenals/Urinary Tract: The adrenal glands are normal bilaterally. Kidneys and ureters are unremarkable. The urinary bladder is within normal limits. Stomach/Bowel: A small hiatal hernia is present. The stomach otherwise unremarkable. Secondary inflammatory changes are present in the second-third portions of the duodenum. The small bowel is within normal limits. A small amount of free fluid is noted in the retroperitoneum on the right. The appendix is visualized and normal. The ascending and transverse colon are within normal limits. The descending and rectosigmoid colon are unremarkable. Vascular/Lymphatic: An 8 mm short axis peripancreatic lymph node is present. Subcentimeter portacaval nodes are present. No other significant adenopathy is present. Atherosclerotic changes are noted in the descending abdominal aorta and branch vessels. Reproductive: Calcifications in the anterior fundus of the uterus are compatible with a fibroid. There is some fluid in the endometrial canal, atypical for age. No discrete mass lesion is present. The adnexa are within normal limits for age. Other: No other significant layering intraperitoneal free fluid is present. Musculoskeletal: A vacuum disc is present at L5-S1. Vertebral bodies are otherwise normal aligned. No other focal disc disease is present. IMPRESSION: 1. Diffuse inflammatory changes within the head of the pancreas compatible with acute pancreatitis. 2. Early pseudocyst formation suggested with  areas of fluid and gas. No discrete measurable abscess is present. 3. Reactive sized peripancreatic lymph nodes. 4. Small hiatal hernia. 5. Layering fluid in the retroperitoneum on the right. 6. Uterine calcification, likely fibroid. 7. A small amount of fluid is present within the uterus, atypical for age. Please correlate with previous endometrial biopsy. These results were called by telephone at the time of interpretation on 03/02/2016 at 7:37 am to Dr. Christy Gentles , who verbally acknowledged these results. Electronically Signed   By: San Morelle M.D.   On: 03/02/2016 07:40        Scheduled Meds: . imipenem-cilastatin  500 mg Intravenous Q6H  . levothyroxine  25 mcg Intravenous Daily  . magnesium sulfate 1 - 4 g bolus IVPB  4 g Intravenous Once  . senna-docusate  2 tablet Oral BID   Continuous Infusions: . sodium chloride 125 mL/hr at  03/03/16 0838     LOS: 1 day    Time spent: 94 minutes    Anayely Constantine, MD [facility] Pager [phone]  If 7PM-7AM, please contact night-coverage [email or web address] Password [email or web address] 03/03/2016, 10:03 AM

## 2016-03-03 NOTE — Progress Notes (Signed)
Pharmacy Antibiotic Note  [name] is a 65 y.o. female who presented to the [facility] on 6/30 with N/V and epigastric pain that radiated around the RUQ to the mid-back. CT scan revealed pancreatitis.  Pharmacy has been consulted for Primaxin dosing.  Wt: 92 kg, SCr 0.78, CrCl~70-80 ml/min - will adjust the dose slightly today.  Plan: 1. Adjust Primaxin to 500 mg IV every 6 hours 2. Will continue to follow renal function, culture results, LOT, and antibiotic de-escalation plans   Height: 5\' 3"  (160 cm) Weight: 203 lb 0.7 oz (92.1 kg) IBW/kg (Calculated) : 52.4  Temp (24hrs), Avg:98.1 F (36.7 C), Min:97.6 F (36.4 C), Max:98.7 F (37.1 C)   Recent Labs Lab 03/02/16 0041  03/02/16 0913 03/02/16 1031 03/02/16 1221 03/02/16 2118 03/03/16 0004  WBC 18.6*  --   --   --   --   --  13.3*  CREATININE 0.90  --   --   --   --   --  0.78  LATICACIDVEN  --   < > 2.11* 1.6 2.24* 0.9 1.1  < > = values in this interval not displayed.  Estimated Creatinine Clearance: 74.6 mL/min (by C-G formula based on Cr of 0.78).    Allergies  Allergen Reactions  . Other Itching and Rash    Patient states that she was unable to use supportive wrap they provided at [facility] following a breast removal and reconstruction.    Antimicrobials this admission: Zosyn 6/30 x 1 Primaxin 6/30 >>  Dose adjustments this admission: 7/1: Adjusted d/t wt and renal fxn to 500 mg/6h  Microbiology results: 6/30 BCx >> 6/30 UCx >> needs recollect 6/30 MRSA PCR >> neg  Thank you for allowing pharmacy to be a part of this patient's care.  Alycia Rossetti, PharmD, BCPS Clinical Pharmacist Pager: [phone] 03/03/2016 9:43 AM

## 2016-03-03 NOTE — Consult Note (Addendum)
Consult Note for [facility]  Reason for Consult: Acute pancreatitis Referring Physician: [facility]  [name] HPI: This is a 65 year old female with a PMH of GERD, left breast cancer, and s/p cholecystectomy admitted for acute pancreatitis.  The patient reports that she started to have severe epigastric pain with radiation to the back and RUQ last evening.  This was associated with nausea and vomiting.  The pain was not relieved with any maneuver and she subsequently presented to the ER for further management.  In the ER, the CT scan revealed an acute pancreatitis in the head of the pancreas with some mild gas formation, suggestive of the development of an early pseudocyst.  She has mild biliary ductal dilation consistent with her post cholecystectomy state.  There is no history of ETOH, but she was started on thyroid and antihypertensive medications in June.  She does not recall the name of her antihypertensive medications.  Her HGB/HCT upon admission was 14.1/43 and with 3 liters of LR in the ER it dropped down to 11.2/34.7.  Her HR in the ER was mildly tachycardic at 104, but now it is in the 70's.  Her WBC was mildly elevated, but no reports of any fever.  As a result of the mild gas formation, she was placed on Primaxin.  Creatinine was normal.  Past Medical History  Diagnosis Date  . Anxiety   . GERD (gastroesophageal reflux disease)   . Ductal carcinoma in situ (DCIS) of left breast   12/08/14    Left Upper Outer Quadrant  . Heart murmur     baby  . Depression   . Chronic kidney disease     released from [facility] feb 16 states blood found in urine in past-tests done but could not find reason for blood  . PONV (postoperative nausea and vomiting)     with cataract surgery and ist lumpectomy req patch and iv med for nausea  . Cancer [facility])     breast, left    Past Surgical History  Procedure Laterality Date  . Cataracts l eye Left   . Cataract extraction    . Dental surgery       rt upper tooth removed  and crown? placed  . Cholecystectomy  09/17/2011    Procedure: LAPAROSCOPIC CHOLECYSTECTOMY WITH INTRAOPERATIVE CHOLANGIOGRAM;  Surgeon: [name], MD;  Location: [place];  Service: General;  Laterality: N/A;  carm   . Eye surgery    . Breast lumpectomy with radioactive seed localization Left 01/04/2015    Procedure: LEFT PARTIAL MASTECTOMY WITH RADIOACTIVE SEED LOCALIZATION;  Surgeon: Fanny Skates, MD;  Location: [place];  Service: General;  Laterality: Left;  . Re-excision of breast lumpectomy Left 01/12/2015    Procedure: LEFT  BREAST LUMPECTOMY RE-EXCISION OF MARGINS;  Surgeon: Fanny Skates, MD;  Location: [place];  Service: General;  Laterality: Left;  . Left breast biopsy  12/08/14  . Mastectomy w/ sentinel node biopsy Left 04/07/2015    nipple sparing   . Nipple sparing mastectomy/sentinal lymph node biopsy/reconstruction/placement of tissue expander Left 04/07/2015    Procedure: LEFT NIPPLE SPARING MASTECTOMY WITH LEFT SENTINAL LYMPH NODE BIOPSY AND  RECONSTRUCTION WITH PLACEMENT OF TISSUE EXPANDER;  Surgeon: Fanny Skates, MD;  Location: [place];  Service: General;  Laterality: Left;  . Breast reconstruction with placement of tissue expander and flex hd (acellular hydrated dermis) Left 04/07/2015    Procedure: IMMEDIATE LEFT BREAST RECONSTRUCTION WITH PLACEMENT SALINE IMPLANT AND  CHEST WALL RECONSTRUCTION WITH  ACELLULAR DERMAL MATRIX ;  Surgeon: Crissie Reese, MD;  Location: [place];  Service: Plastics;  Laterality: Left;  . Hysteroscopy w/d&c N/A 10/28/2015    Procedure: DILATATION AND CURETTAGE /HYSTEROSCOPY, Myosure;  Surgeon: Alden Hipp, MD;  Location: [facility];  Service: Gynecology;  Laterality: N/A;    Family History  Problem Relation Age of Onset  . Heart disease Mother   . Hypertension Mother   . Leukemia Father   . Heart disease Father   . Breast cancer Maternal Aunt   . Colon cancer      Father's aunt  . Cancer Brother      Social History:  reports that she quit smoking about 16 months ago. Her smoking use included Cigarettes. She has a 7.5 pack-year smoking history. She has never used smokeless tobacco. She reports that she drinks about 0.6 oz of alcohol per week. She reports that she does not use illicit drugs.  Allergies:  Allergies  Allergen Reactions  . Other Itching and Rash    Patient states that she was unable to use supportive wrap they provided at [facility] following a breast removal and reconstruction.    Medications:  Scheduled: . imipenem-cilastatin  500 mg Intravenous Q6H  . levothyroxine  25 mcg Intravenous Daily  . magnesium sulfate 1 - 4 g bolus IVPB  4 g Intravenous Once  . senna-docusate  2 tablet Oral BID   Continuous: . sodium chloride 250 mL/hr at 03/03/16 1058    Results for orders placed or performed during the hospital encounter of 03/02/16 (from the past 24 hour(s))  I-Stat CG4 Lactic Acid, ED  (not at  [facility])     Status: Abnormal   Collection Time: 03/02/16 12:21 PM  Result Value Ref Range   Lactic Acid, Venous 2.24 (HH) 0.5 - 1.9 mmol/L   Comment NOTIFIED PHYSICIAN   MRSA PCR Screening     Status: None   Collection Time: 03/02/16  6:09 PM  Result Value Ref Range   MRSA by PCR NEGATIVE NEGATIVE  Lactic acid, plasma     Status: None   Collection Time: 03/02/16  9:18 PM  Result Value Ref Range   Lactic Acid, Venous 0.9 0.5 - 1.9 mmol/L  Basic metabolic panel     Status: Abnormal   Collection Time: 03/03/16 12:04 AM  Result Value Ref Range   Sodium 142 135 - 145 mmol/L   Potassium 3.9 3.5 - 5.1 mmol/L   Chloride 110 101 - 111 mmol/L   CO2 25 22 - 32 mmol/L   Glucose, Bld 120 (H) 65 - 99 mg/dL   BUN 8 6 - 20 mg/dL   Creatinine, Ser 0.78 0.44 - 1.00 mg/dL   Calcium 7.8 (L) 8.9 - 10.3 mg/dL   GFR calc non Af Amer >60 >60 mL/min   GFR calc Af Amer >60 >60 mL/min   Anion gap 7 5 - 15  CBC     Status: Abnormal   Collection Time: 03/03/16 12:04 AM  Result Value  Ref Range   WBC 13.3 (H) 4.0 - 10.5 K/uL   RBC 3.59 (L) 3.87 - 5.11 MIL/uL   Hemoglobin 11.2 (L) 12.0 - 15.0 g/dL   HCT 34.7 (L) 36.0 - 46.0 %   MCV 96.7 78.0 - 100.0 fL   MCH 31.2 26.0 - 34.0 pg   MCHC 32.3 30.0 - 36.0 g/dL   RDW 14.0 11.5 - 15.5 %   Platelets 227 150 - 400 K/uL  Lactic acid, plasma     Status: None   Collection Time: 03/03/16 12:04 AM  Result Value Ref Range   Lactic Acid, Venous 1.1 0.5 - 1.9 mmol/L  Magnesium     Status: Abnormal   Collection Time: 03/03/16 12:04 AM  Result Value Ref Range   Magnesium 1.6 (L) 1.7 - 2.4 mg/dL     Dg Chest 2 View  03/02/2016  CLINICAL DATA:  Chest pain and dyspnea. EXAM: CHEST  2 VIEW COMPARISON:  12/31/2014 FINDINGS: The heart size and mediastinal contours are within normal limits. Both lungs are clear. The visualized skeletal structures are unremarkable. IMPRESSION: No active cardiopulmonary disease. Electronically Signed   By: Andreas Newport M.D.   On: 03/02/2016 01:25   Ct Abdomen Pelvis W Contrast  03/02/2016  CLINICAL DATA:  Severe epigastric pain with nausea and vomiting. EXAM: CT ABDOMEN AND PELVIS WITH CONTRAST TECHNIQUE: Multidetector CT imaging of the abdomen and pelvis was performed using the standard protocol following bolus administration of intravenous contrast. CONTRAST:  100 ISOVUE-300 IOPAMIDOL (ISOVUE-300) INJECTION 61% COMPARISON:  CT of the abdomen and pelvis 10/19/2011 FINDINGS: Lower chest: Mild dependent atelectasis is present in the lungs bilaterally. The heart size is normal. A left breast prosthesis is noted. Hepatobiliary: Mild intra and extrahepatic biliary dilation is present following cholecystectomy. No discrete hepatic lesions are present. Pancreas: There is marked inflammatory change at the head of pancreas. No discrete mass lesion is present. The more distal pancreas is unremarkable. The tail the pancreas is markedly atrophic as previously seen. There is gas in the head of the pancreas with potential  early pseudocyst formation. Spleen: Within normal limits. Adrenals/Urinary Tract: The adrenal glands are normal bilaterally. Kidneys and ureters are unremarkable. The urinary bladder is within normal limits. Stomach/Bowel: A small hiatal hernia is present. The stomach otherwise unremarkable. Secondary inflammatory changes are present in the second-third portions of the duodenum. The small bowel is within normal limits. A small amount of free fluid is noted in the retroperitoneum on the right. The appendix is visualized and normal. The ascending and transverse colon are within normal limits. The descending and rectosigmoid colon are unremarkable. Vascular/Lymphatic: An 8 mm short axis peripancreatic lymph node is present. Subcentimeter portacaval nodes are present. No other significant adenopathy is present. Atherosclerotic changes are noted in the descending abdominal aorta and branch vessels. Reproductive: Calcifications in the anterior fundus of the uterus are compatible with a fibroid. There is some fluid in the endometrial canal, atypical for age. No discrete mass lesion is present. The adnexa are within normal limits for age. Other: No other significant layering intraperitoneal free fluid is present. Musculoskeletal: A vacuum disc is present at L5-S1. Vertebral bodies are otherwise normal aligned. No other focal disc disease is present. IMPRESSION: 1. Diffuse inflammatory changes within the head of the pancreas compatible with acute pancreatitis. 2. Early pseudocyst formation suggested with areas of fluid and gas. No discrete measurable abscess is present. 3. Reactive sized peripancreatic lymph nodes. 4. Small hiatal hernia. 5. Layering fluid in the retroperitoneum on the right. 6. Uterine calcification, likely fibroid. 7. A small amount of fluid is present within the uterus, atypical for age. Please correlate with previous endometrial biopsy. These results were called by telephone at the time of interpretation  on 03/02/2016 at 7:37 am to Dr. Christy Gentles , who verbally acknowledged these results. Electronically Signed   By: San Morelle M.D.   On: 03/02/2016 07:40    ROS:  As stated above in the HPI  otherwise negative.  Blood pressure 119/57, pulse 88, temperature 98.7 F (37.1 C), temperature source Oral, resp. rate 14, height 5\' 3"  (1.6 m), weight 92.1 kg (203 lb 0.7 oz), SpO2 98 %.    PE: Gen: NAD, Alert and Oriented HEENT:  [place]/AT, EOMI Neck: Supple, no LAD Lungs: CTA Bilaterally CV: RRR without M/G/R ABM: Soft, some upper abdominal tenderness, +BS Ext: No C/C/E  Assessment/Plan: 1) Acute mild to moderate interstitial pancreatitis. 2) S/p cholecystectomy. 3) S/p left mastectomy.   There is no overt source of her pancreatitis at this time, but her triglycerides have not been checked yet.  She may have exhibited an acute gallstone pancreatitis, but her ALT is normal.  She was appropriately treated in the ER 3 liters of LR and her creatinine remains normal and her HCT has dropped.  The patient requires continued IV hydration at 3 ml/kg/hour for the next 12-24 hours.  There is no documentation of a fever with the identification of mild gas formation in the head of the pancreas.  Also, her vital signs are stable, i.e., no hypotension.    Plan: 1) Increase NS to 250 ml/hour. 2) Follow electrolytes and replete with the increase in IV fluids. 3) Okay to maintain Primaxin for now with the CT scan findings. 4) Repeat CT scan in the next 3-7 days to assess for necrosis with adequate IV hydration. 5) She will require an EUS with [facility] in 2-3 months, once the acute pancreatitis has resolved to assess for any evidence of stones, sludge, or masses. 6) Continue with pain control. 7) Advance diet once pain is improving.    Naziah Portee D 03/03/2016, 10:51 AM

## 2016-03-04 LAB — CBC WITH DIFFERENTIAL/PLATELET
Basophils Absolute: 0 10*3/uL (ref 0.0–0.1)
Basophils Relative: 0 %
EOS ABS: 0.3 10*3/uL (ref 0.0–0.7)
Eosinophils Relative: 4 %
HEMATOCRIT: 30.9 % — AB (ref 36.0–46.0)
HEMOGLOBIN: 10 g/dL — AB (ref 12.0–15.0)
LYMPHS ABS: 2.5 10*3/uL (ref 0.7–4.0)
LYMPHS PCT: 27 %
MCH: 31.2 pg (ref 26.0–34.0)
MCHC: 32.4 g/dL (ref 30.0–36.0)
MCV: 96.3 fL (ref 78.0–100.0)
MONOS PCT: 8 %
Monocytes Absolute: 0.8 10*3/uL (ref 0.1–1.0)
NEUTROS ABS: 5.5 10*3/uL (ref 1.7–7.7)
NEUTROS PCT: 61 %
Platelets: 202 10*3/uL (ref 150–400)
RBC: 3.21 MIL/uL — AB (ref 3.87–5.11)
RDW: 13.8 % (ref 11.5–15.5)
WBC: 9.2 10*3/uL (ref 4.0–10.5)

## 2016-03-04 LAB — COMPREHENSIVE METABOLIC PANEL
ALT: 60 U/L — AB (ref 14–54)
ANION GAP: 9 (ref 5–15)
AST: 89 U/L — ABNORMAL HIGH (ref 15–41)
Albumin: 2.3 g/dL — ABNORMAL LOW (ref 3.5–5.0)
Alkaline Phosphatase: 86 U/L (ref 38–126)
BUN: <5 mg/dL — ABNORMAL LOW (ref 6–20)
CHLORIDE: 111 mmol/L (ref 101–111)
CO2: 21 mmol/L — AB (ref 22–32)
CREATININE: 0.68 mg/dL (ref 0.44–1.00)
Calcium: 7.8 mg/dL — ABNORMAL LOW (ref 8.9–10.3)
Glucose, Bld: 75 mg/dL (ref 65–99)
POTASSIUM: 3.2 mmol/L — AB (ref 3.5–5.1)
SODIUM: 141 mmol/L (ref 135–145)
Total Bilirubin: 1.8 mg/dL — ABNORMAL HIGH (ref 0.3–1.2)
Total Protein: 4.8 g/dL — ABNORMAL LOW (ref 6.5–8.1)

## 2016-03-04 LAB — LIPASE, BLOOD: LIPASE: 32 U/L (ref 11–51)

## 2016-03-04 LAB — MAGNESIUM: MAGNESIUM: 2.3 mg/dL (ref 1.7–2.4)

## 2016-03-04 MED ORDER — MORPHINE SULFATE (PF) 2 MG/ML IV SOLN
1.0000 mg | INTRAVENOUS | Status: DC | PRN
Start: 2016-03-04 — End: 2016-03-06

## 2016-03-04 MED ORDER — FUROSEMIDE 10 MG/ML IJ SOLN
40.0000 mg | Freq: Once | INTRAMUSCULAR | Status: AC
  Administered 2016-03-04: 40 mg via INTRAVENOUS
  Filled 2016-03-04: qty 4

## 2016-03-04 MED ORDER — POTASSIUM CHLORIDE CRYS ER 20 MEQ PO TBCR
40.0000 meq | EXTENDED_RELEASE_TABLET | ORAL | Status: AC
  Administered 2016-03-04 (×2): 40 meq via ORAL
  Filled 2016-03-04 (×2): qty 2

## 2016-03-04 NOTE — Progress Notes (Signed)
PROGRESS NOTE    [name]  [id number] DOB: 1950-12-29 DOA: 03/02/2016 PCP: [name], MD   Brief Narrative:  [name] is a 65 y.o. female with medical history significant for but not limited to, left breast cancer, depression, and GERD. Last night around 7pm patient suddently developed N/V and epigastric pain radiating around RUQ to right mid back. Pain was constant, unrelated to position. Patient has never had this type pain before. No diarrhea. No chills at home but some upon arrival to ED. Pain better at present after pain meds. In normal state of health until last evening. CT scan reveals pancreatitis. Patient rarely drinks ETOH. She did recently start BP med.   Assessment & Plan:   Principal Problem:   Sepsis ([place]) Active Problems:   Pancreatitis   GERD (gastroesophageal reflux disease)   Breast cancer, left breast (HCC)   Hypokalemia   Depression   Hypothyroidism   Severe sepsis (HCC)  #1 sepsis secondary to acute pancreatitis with early pseudocyst formation Patient admitted with nausea, vomiting abdominal pain CT abdomen and pelvis consistent with an acute pancreatitis with early pseudocyst formation.Meeting criteria for sepsis with elevated lactic acid level of 4.37, leukocytosis with a white count of 18,000, tachycardic with borderline blood pressure. Patient currently afebrile. Leukocytosis trending down. Lactic acid level trending down. Blood cultures pending. Continue empiric IV Primaxin, pain management, IV fluids. Supportive care. Patient has been started on clears per GI. Supportive care.  #2 acute pancreatitis with early pseudocyst formation Per CT abdomen and pelvis. CT abdomen and pelvis with mild intra-next or hepatic biliary dilatation following cholecystectomy. Lipase level was 59 on admission with normal LFTs.Lipase levels trending down.  Patient with no prior history of pancreatitis. Patient denies any ongoing daily heavy alcohol use.  Fasting lipid panel triglycerides of 94. Patient with clinical improvement. IV fluid rate has been decreased by gastroenterology area patient has been started on clear liquids. Change IV Dilaudid to IV morphine as patient states IV Dilaudid causing nausea about of emesis. Continue empiric IV Primaxin. Per GI patient likely needs repeat CT abdomen and pelvis will defer timing to GI. GI following and appreciate input and recommendations.  #3 hypokalemia/hypomagnesemia Replete.  #4 hypothyroidism Continue IV Synthroid.  #5 depression Stable. Hold Effexor and when tolerating oral intake will resume.  #6 gastroesophageal reflux disease IV PPI.  #7 history of left breast cancer Tamoxifen on hold and will resume when tolerating oral intake.   DVT prophylaxis: SCDs Code Status: Full Family Communication: Updated patient. No family at bedside. Disposition Plan: Remain the step down unit. Home when medically stable and pancreatitis has resolved and tolerating oral intake.   Consultants:   [name] 03/03/2016  Procedures:   CT abdomen and pelvis 03/02/2016  Chest x-ray 03/02/2016  Antimicrobials:   IV Primaxin 03/02/2016   Subjective: Patient states she feels significantly better. Abdominal pain improved. Tolerating clear liquids. Patient denies any chest pain. Patient looks visibly short of breath. Patient states IV Dilaudid causing nausea and emesis.  Objective: Filed Vitals:   03/04/16 0000 03/04/16 0340 03/04/16 0357 03/04/16 0744  BP: 140/68 147/72  150/78  Pulse:      Temp:  97.9 F (36.6 C)  97.7 F (36.5 C)  TempSrc:  Oral  Oral  Resp: 16 15  5   Height:      Weight:   95.9 kg (211 lb 6.7 oz)   SpO2: 98% 96%  100%    Intake/Output Summary (Last 24  hours) at 03/04/16 1024 Last data filed at 03/04/16 0600  Gross per 24 hour  Intake   3050 ml  Output      0 ml  Net   3050 ml   Filed Weights   03/02/16 1819 03/03/16 0500 03/04/16 0357    Weight: 92.987 kg (205 lb) 92.1 kg (203 lb 0.7 oz) 95.9 kg (211 lb 6.7 oz)    Examination:  General exam: Sitting up at bedside with some SOB. Respiratory system: Decreased BS in bases. Fair air movement. Some SOB sitting at bedside. Cardiovascular system: S1 & S2 heard, RRR. No JVD, murmurs, rubs, gallops or clicks. Trace - 1 +BLE. Gastrointestinal system: Abdomen is mildly distended, soft and significantly less diffusely tender to palpation. No organomegaly or masses felt. Normal bowel sounds heard. Central nervous system: Alert and oriented. No focal neurological deficits. Extremities: Symmetric 5 x 5 power. Skin: No rashes, lesions or ulcers Psychiatry: Judgement and insight appear normal. Mood & affect appropriate.     Data Reviewed: I have personally reviewed following labs and imaging studies  CBC:  Recent Labs Lab 03/02/16 0041 03/03/16 0004 03/04/16 0304  WBC 18.6* 13.3* 9.2  NEUTROABS  --   --  5.5  HGB 14.1 11.2* 10.0*  HCT 43.0 34.7* 30.9*  MCV 95.6 96.7 96.3  PLT 285 227 [id number]   Basic Metabolic Panel:  Recent Labs Lab 03/02/16 0041 03/02/16 1041 03/03/16 0004 03/04/16 0304  NA 137  --  142 141  K 3.1*  --  3.9 3.2*  CL 98*  --  110 111  CO2 25  --  25 21*  GLUCOSE 138*  --  120* 75  BUN 16  --  8 <5*  CREATININE 0.90  --  0.78 0.68  CALCIUM 9.7  --  7.8* 7.8*  MG  --  1.4* 1.6* 2.3   GFR: Estimated Creatinine Clearance: 76.2 mL/min (by C-G formula based on Cr of 0.68). Liver Function Tests:  Recent Labs Lab 03/02/16 0756 03/04/16 0304  AST 36 89*  ALT 31 60*  ALKPHOS 49 86  BILITOT 1.2 1.8*  PROT 5.5* 4.8*  ALBUMIN 3.0* 2.3*    Recent Labs Lab 03/02/16 0756 03/04/16 0304  LIPASE 59* 32   No results for input(s): AMMONIA in the last 168 hours. Coagulation Profile:  Recent Labs Lab 03/02/16 1032  INR 1.10   Cardiac Enzymes: No results for input(s): CKTOTAL, CKMB, CKMBINDEX, TROPONINI in the last 168 hours. BNP (last 3  results) No results for input(s): PROBNP in the last 8760 hours. HbA1C: No results for input(s): HGBA1C in the last 72 hours. CBG: No results for input(s): GLUCAP in the last 168 hours. Lipid Profile:  Recent Labs  03/02/16 1040  CHOL 138  HDL 53  LDLCALC 66  TRIG 94  CHOLHDL 2.6   Thyroid Function Tests: No results for input(s): TSH, T4TOTAL, FREET4, T3FREE, THYROIDAB in the last 72 hours. Anemia Panel: No results for input(s): VITAMINB12, FOLATE, FERRITIN, TIBC, IRON, RETICCTPCT in the last 72 hours. Sepsis Labs:  Recent Labs Lab 03/02/16 1031 03/02/16 1032 03/02/16 1221 03/02/16 2118 03/03/16 0004  PROCALCITON  --  0.57  --   --   --   LATICACIDVEN 1.6  --  2.24* 0.9 1.1    Recent Results (from the past 240 hour(s))  Urine culture     Status: Abnormal   Collection Time: 03/02/16  5:59 AM  Result Value Ref Range Status   Specimen Description URINE, RANDOM  Final   Special Requests NONE  Final   Culture MULTIPLE SPECIES PRESENT, SUGGEST RECOLLECTION (A)  Final   Report Status 03/03/2016 FINAL  Final  Blood Culture (routine x 2)     Status: None (Preliminary result)   Collection Time: 03/02/16  6:05 AM  Result Value Ref Range Status   Specimen Description BLOOD LEFT HAND  Final   Special Requests BOTTLES DRAWN AEROBIC AND ANAEROBIC 5ML  Final   Culture NO GROWTH 1 DAY  Final   Report Status PENDING  Incomplete  Blood Culture (routine x 2)     Status: None (Preliminary result)   Collection Time: 03/02/16  6:13 AM  Result Value Ref Range Status   Specimen Description BLOOD RIGHT HAND  Final   Special Requests IN PEDIATRIC BOTTLE 2ML  Final   Culture NO GROWTH 1 DAY  Final   Report Status PENDING  Incomplete  MRSA PCR Screening     Status: None   Collection Time: 03/02/16  6:09 PM  Result Value Ref Range Status   MRSA by PCR NEGATIVE NEGATIVE Final    Comment:        The GeneXpert MRSA Assay (FDA approved for NASAL specimens only), is one component of  a comprehensive MRSA colonization surveillance program. It is not intended to diagnose MRSA infection nor to guide or monitor treatment for MRSA infections.          Radiology Studies: No results found.      Scheduled Meds: . imipenem-cilastatin  500 mg Intravenous Q6H  . levothyroxine  25 mcg Intravenous Daily  . potassium chloride  40 mEq Oral Q4H  . senna-docusate  2 tablet Oral BID   Continuous Infusions: . sodium chloride 250 mL/hr at 03/03/16 1743     LOS: 2 days    Time spent: 7 minutes    Manford Sprong, MD [facility] Pager [phone]  If 7PM-7AM, please contact night-coverage [email or web address] Password [facility] 03/04/2016, 10:24 AM

## 2016-03-04 NOTE — Progress Notes (Signed)
Subjective: Feeling better.  Objective: Vital signs in last 24 hours: Temp:  [97.7 F (36.5 C)-98.4 F (36.9 C)] 97.7 F (36.5 C) (07/02 0744) Pulse Rate:  [74] 74 (07/01 1641) Resp:  [5-20] 5 (07/02 0744) BP: (132-150)/(45-78) 150/78 mmHg (07/02 0744) SpO2:  [96 %-100 %] 100 % (07/02 0744) Weight:  [95.9 kg (211 lb 6.7 oz)] 95.9 kg (211 lb 6.7 oz) (07/02 0357) Last BM Date: 03/02/16  Intake/Output from previous day: 07/01 0701 - 07/02 0700 In: 3050 [I.V.:2750; IV Piggyback:300] Out: -  Intake/Output this shift:    General appearance: alert and no distress GI: less tenderness in the epigastric region  Lab Results:  Recent Labs  03/02/16 0041 03/03/16 0004 03/04/16 0304  WBC 18.6* 13.3* 9.2  HGB 14.1 11.2* 10.0*  HCT 43.0 34.7* 30.9*  PLT 285 227 202   BMET  Recent Labs  03/02/16 0041 03/03/16 0004 03/04/16 0304  NA 137 142 141  K 3.1* 3.9 3.2*  CL 98* 110 111  CO2 25 25 21*  GLUCOSE 138* 120* 75  BUN 16 8 <5*  CREATININE 0.90 0.78 0.68  CALCIUM 9.7 7.8* 7.8*   LFT  Recent Labs  03/02/16 0756 03/04/16 0304  PROT 5.5* 4.8*  ALBUMIN 3.0* 2.3*  AST 36 89*  ALT 31 60*  ALKPHOS 49 86  BILITOT 1.2 1.8*  BILIDIR 0.3  --   IBILI 0.9  --    PT/INR  Recent Labs  03/02/16 1032  LABPROT 14.4  INR 1.10   Hepatitis Panel No results for input(s): HEPBSAG, HCVAB, HEPAIGM, HEPBIGM in the last 72 hours. C-Diff No results for input(s): CDIFFTOX in the last 72 hours. Fecal Lactopherrin No results for input(s): FECLLACTOFRN in the last 72 hours.  Studies/Results: No results found.  Medications:  Scheduled: . imipenem-cilastatin  500 mg Intravenous Q6H  . levothyroxine  25 mcg Intravenous Daily  . potassium chloride  40 mEq Oral Q4H  . senna-docusate  2 tablet Oral BID   Continuous: . sodium chloride 250 mL/hr at 03/03/16 1743    Assessment/Plan: 1) Acute mild - moderate pancreatitis. 2) Hypothyroidism.    She is improving at this time.   From my observation she does not appear to be as uncomfortable as yesterday.  Per her report, she uses the Dilaudid every 7 hours, on average.  She thinks the Dilaudid induced nausea and vomiting once yesterday.  It appears that she has received adequate IV hydration.  I will drop down the fluids to 150 ml per hour and she can continue with the pain medications.  She feels that she can tolerate clear liquids and I will advance her diet.  Okay to continue with antibiotics for now, but she will require a repeat CT scan in the near future.  [facility], [name], to assume care in the AM.  LOS: 2 days   [name] D 03/04/2016, 9:30 AM

## 2016-03-05 DIAGNOSIS — R932 Abnormal findings on diagnostic imaging of liver and biliary tract: Secondary | ICD-10-CM | POA: Insufficient documentation

## 2016-03-05 DIAGNOSIS — K858 Other acute pancreatitis without necrosis or infection: Secondary | ICD-10-CM

## 2016-03-05 DIAGNOSIS — E8770 Fluid overload, unspecified: Secondary | ICD-10-CM

## 2016-03-05 LAB — COMPREHENSIVE METABOLIC PANEL
ALT: 51 U/L (ref 14–54)
ANION GAP: 18 — AB (ref 5–15)
AST: 48 U/L — ABNORMAL HIGH (ref 15–41)
Albumin: 2.6 g/dL — ABNORMAL LOW (ref 3.5–5.0)
Alkaline Phosphatase: 80 U/L (ref 38–126)
BUN: <5 mg/dL — ABNORMAL LOW (ref 6–20)
CHLORIDE: 104 mmol/L (ref 101–111)
CO2: 25 mmol/L (ref 22–32)
CREATININE: 0.68 mg/dL (ref 0.44–1.00)
Calcium: 9.5 mg/dL (ref 8.9–10.3)
GFR calc non Af Amer: >60 mL/min (ref >60)
Glucose, Bld: 95 mg/dL (ref 65–99)
POTASSIUM: 3.1 mmol/L — AB (ref 3.5–5.1)
SODIUM: 147 mmol/L — AB (ref 135–145)
Total Bilirubin: 1.1 mg/dL (ref 0.3–1.2)
Total Protein: 5.5 g/dL — ABNORMAL LOW (ref 6.5–8.1)

## 2016-03-05 LAB — CBC
HCT: 31.6 % — ABNORMAL LOW (ref 36.0–46.0)
Hemoglobin: 10.2 g/dL — ABNORMAL LOW (ref 12.0–15.0)
MCH: 30.5 pg (ref 26.0–34.0)
MCHC: 32.3 g/dL (ref 30.0–36.0)
MCV: 94.6 fL (ref 78.0–100.0)
PLATELETS: 240 10*3/uL (ref 150–400)
RBC: 3.34 MIL/uL — AB (ref 3.87–5.11)
RDW: 13.5 % (ref 11.5–15.5)
WBC: 8.9 10*3/uL (ref 4.0–10.5)

## 2016-03-05 LAB — LIPASE, BLOOD: Lipase: 33 U/L (ref 11–51)

## 2016-03-05 LAB — MAGNESIUM: MAGNESIUM: 1.7 mg/dL (ref 1.7–2.4)

## 2016-03-05 MED ORDER — DEXTROSE 5 % IV SOLN
INTRAVENOUS | Status: DC
  Administered 2016-03-05 (×2): via INTRAVENOUS

## 2016-03-05 MED ORDER — MAGNESIUM SULFATE 50 % IJ SOLN
3.0000 g | Freq: Once | INTRAVENOUS | Status: AC
  Administered 2016-03-05: 3 g via INTRAVENOUS
  Filled 2016-03-05: qty 6

## 2016-03-05 MED ORDER — HYDROCORTISONE 1 % EX CREA
TOPICAL_CREAM | Freq: Two times a day (BID) | CUTANEOUS | Status: DC
  Administered 2016-03-05: 16:00:00 via TOPICAL
  Administered 2016-03-05: 1 via TOPICAL
  Administered 2016-03-06: 09:00:00 via TOPICAL
  Filled 2016-03-05: qty 28

## 2016-03-05 MED ORDER — HYDROCHLOROTHIAZIDE 25 MG PO TABS: 25.0000 mg | ORAL_TABLET | Freq: Every day | ORAL | Status: DC

## 2016-03-05 MED ORDER — POTASSIUM CHLORIDE CRYS ER 20 MEQ PO TBCR
40.0000 meq | EXTENDED_RELEASE_TABLET | ORAL | Status: AC
  Administered 2016-03-05 (×2): 40 meq via ORAL
  Filled 2016-03-05 (×2): qty 2

## 2016-03-05 MED ORDER — LEVOTHYROXINE SODIUM 50 MCG PO TABS
50.0000 ug | ORAL_TABLET | Freq: Every day | ORAL | Status: DC
  Administered 2016-03-06: 50 ug via ORAL
  Filled 2016-03-05: qty 1

## 2016-03-05 NOTE — Clinical Documentation Improvement (Signed)
Hospitalist   Please document query responses in the progress notes and discharge summary, not on the CDI BPA form in [facility]. Thank you!  The patient has an order for the following medication(s):  Lasix 40 mg IV x 1 on 03/04/16 by Dr. Grandville Silos  Please provide a corresponding diagnosis that supports the use of this medication:  - Volume/Fluid Overload  - Other condition  - Unable to clinically determine  Clinical Information: "Patient looks visibly short of breath" documented in progress note 03/04/16 by Dr. Grandville Silos Patient was fluid resusitated on admission and IV fluids were continued post admission up to 250 ml/hr. Patient Net Since Admission per [facility] Flowsheets - +5139.6 Urine output 03/04/16 am - 04/01/16 am - 2400 per [facility] Flowsheets  Please exercise your independent, professional judgment when responding. A specific answer is not anticipated or expected.   Thank You,  Erling Conte  RN BSN CCDS [phone] Health Information Management [place]

## 2016-03-05 NOTE — Progress Notes (Signed)
Daily Rounding Note  03/05/2016, 8:26 AM  LOS: 3 days   SUBJECTIVE:       SOB last evening, treated with IV Lasix and IVF now at [facility].  She has tolerated 3 meals of clears.  Abdominal pain in epigastrium and RUQ is much improved.  No nausea.   OBJECTIVE:         Vital signs in last 24 hours:    Temp:  [97.9 F (36.6 C)-98.8 F (37.1 C)] 97.9 F (36.6 C) (07/03 0400) Pulse Rate:  [65] 65 (07/02 2000) Resp:  [12-20] 15 (07/03 0400) BP: (145-179)/(62-87) 168/70 mmHg (07/03 0400) SpO2:  [95 %-100 %] 95 % (07/03 0400) Weight:  [92.5 kg (203 lb 14.8 oz)] 92.5 kg (203 lb 14.8 oz) (07/03 0408) Last BM Date: 03/04/16 Filed Weights   03/03/16 0500 03/04/16 0357 03/05/16 0408  Weight: 92.1 kg (203 lb 0.7 oz) 95.9 kg (211 lb 6.7 oz) 92.5 kg (203 lb 14.8 oz)   General: pleasant, non-ill appearing.  comfortable   Heart: RRR Chest: clear bil.  No wheexing, rales, cough or dyspnea.   Abdomen: soft, tenderness mild in epigastric region.  Active BS.  ND  Extremities: no CCE Neuro/Psych:  Pleasant, no gross deficits.  Fully alert and awake.   Intake/Output from previous day: 07/02 0701 - 07/03 0700 In: 2332.5 [P.O.:400; I.V.:1532.5; IV Piggyback:400] Out: 2400 [Urine:2400]  Intake/Output this shift:    Lab Results:  Recent Labs  03/03/16 0004 03/04/16 0304 03/05/16 0239  WBC 13.3* 9.2 8.9  HGB 11.2* 10.0* 10.2*  HCT 34.7* 30.9* 31.6*  PLT 227 202 240   BMET  Recent Labs  03/03/16 0004 03/04/16 0304 03/05/16 0239  NA 142 141 147*  K 3.9 3.2* 3.1*  CL 110 111 104  CO2 25 21* 25  GLUCOSE 120* 75 95  BUN 8 <5* <5*  CREATININE 0.78 0.68 0.68  CALCIUM 7.8* 7.8* 9.5   LFT  Recent Labs  03/04/16 0304 03/05/16 0239  PROT 4.8* 5.5*  ALBUMIN 2.3* 2.6*  AST 89* 48*  ALT 60* 51  ALKPHOS 86 80  BILITOT 1.8* 1.1   PT/INR  Recent Labs  03/02/16 1032  LABPROT 14.4  INR 1.10   Hepatitis Panel No results  for input(s): HEPBSAG, HCVAB, HEPAIGM, HEPBIGM in the last 72 hours.  Studies/Results: No results found.   Scheduled Meds: . imipenem-cilastatin  500 mg Intravenous Q6H  . levothyroxine  25 mcg Intravenous Daily  . magnesium sulfate 1 - 4 g bolus IVPB  3 g Intravenous Once  . potassium chloride  40 mEq Oral Q4H   Continuous Infusions: . dextrose     PRN Meds:.acetaminophen **OR** acetaminophen, bisacodyl, morphine injection, ondansetron **OR** ondansetron (ZOFRAN) IV   ASSESMENT:   *  Acute pancreatitis.  Lipase max 59, rapidly normalized.  ST 6/3 Mild intra/extra biliary ductal dilatation (present on 2013 ultrasound) post chole.  Early pseudocyst. LFTs improved.  Day 3 Primaxin.  ? Is HCTZ, started June 2017, culprit?   *  Uterine fluid, atypical for age.  Per CT.  Had  Post menopausal bleeding last year and uterine polyps were removed.   *  Hypokalemia.    *  Normocytic anemia post aggressive IVF.    *  Hx breast cancer, on Tamoxifen.     PLAN   *   Consider discontinuing HCTZ, currently on hold.  *    Will stop Primaxin.  Switch to oral synthroid.  Advance to full liquids.   *    Outpt follow up with gyn:  D/w pt so she is aware.   *    Does she need MRCP or EUS?  Will d/w  Dr Henrene Pastor.     [name]  03/05/2016, 8:26 AM Pager: [phone]  GI ATTENDING  Case reviewed with Dr. Benson Norway this morning after his initial consultation. Patient with acute pancreatitis. She does not use alcohol. She is status post cholecystectomy. Started on hydrochlorothiazide one month ago. Suspect this is the etiology of her pancreatitis. Responding nicely to supportive measures. Tolerating diet currently. Agree with hydration, related to diet as tolerated, discontinuation of hydrochlorothiazide (her PCP can find an alternative antihypertensive), and outpatient GI follow-up. Would plan endoscopic ultrasound in about 8 weeks after acute inflammation has subsided. This to rule out structural  abnormalities of the pancreas and interrogated the bile duct (rule out CBD stone postcholecystectomy). Patient understands. We'll follow. Hopefully home soon.  Docia Chuck. Geri Seminole., M.D. [facility] Division of Gastroenterology

## 2016-03-05 NOTE — Care Management Important Message (Signed)
Important Message  Patient Details  Name: [name] MRN: [id number] Date of Birth: 10-Aug-1951   Medicare Important Message Given:  Yes    Nathen May 03/05/2016, 10:25 AM

## 2016-03-05 NOTE — Progress Notes (Signed)
PROGRESS NOTE    [name]  [id number] DOB: 01/27/1951 DOA: 03/02/2016 PCP: [name], MD   Brief Narrative:  [name] is a 65 y.o. female with medical history significant for but not limited to, left breast cancer, depression, and GERD. Last night around 7pm patient suddently developed N/V and epigastric pain radiating around RUQ to right mid back. Pain was constant, unrelated to position. Patient has never had this type pain before. No diarrhea. No chills at home but some upon arrival to ED. Pain better at present after pain meds. In normal state of health until last evening. CT scan reveals pancreatitis. Patient rarely drinks ETOH. She did recently start BP med.   Assessment & Plan:   Principal Problem:   Severe sepsis ([place]) Active Problems:   Pancreatitis   GERD (gastroesophageal reflux disease)   Breast cancer, left breast ([facility])   Hypokalemia   Depression   Hypothyroidism   Sepsis ([facility])   Volume overload  #1 sepsis secondary to acute pancreatitis with early pseudocyst formation Patient admitted with nausea, vomiting abdominal pain CT abdomen and pelvis consistent with an acute pancreatitis with early pseudocyst formation.Meeting criteria for sepsis with elevated lactic acid level of 4.37, leukocytosis with a white count of 18,000, tachycardic with borderline blood pressure. Patient currently afebrile. Leukocytosis trending down. Lactic acid level trending down. Blood cultures pending. Continue empiric IV Primaxin, pain management, IV fluids. Supportive care. Patient has been started on clears per GI which he tolerated and diet now advanced to full liquids. Change IV fluids to D5W at 75 mL per hour and monitor esterase status. Supportive care.  #2 acute pancreatitis with early pseudocyst formation Per CT abdomen and pelvis. ?? Secondary to HCTZ . Clinically improving. CT abdomen and pelvis with mild intra-next or hepatic biliary dilatation following  cholecystectomy. Lipase level was 59 on admission with normal LFTs.Lipase levels trending down.  Patient with no prior history of pancreatitis. Patient denies any ongoing daily heavy alcohol use. Fasting lipid panel triglycerides of 94. Patient with clinical improvement. IV fluid rate has been decreased by gastroenterology area patient has been started on clear liquids. Changed IV Dilaudid to IV morphine as patient states IV Dilaudid causing nausea about of emesis. Continue empiric IV Primaxin. Per GI patient likely needs repeat CT abdomen and pelvis will defer timing to GI. Decreased IV fluids last night due to volume overload. GI following and appreciate input and recommendations. Patient tolerated clear liquids. Diet has been advanced to full liquids.  #3 volume overload Secondary to aggressive fluid resuscitation secondary to acute pancreatitis. Patient yesterday noted to be visibly short of breath was given Lasix 40 mg IV 1 with good diuresis with a urine output of 2.4 L. Patient with significant clinical improvement. Patient states shortness of breath has improved significantly. Change IV fluids to D5W at 75 mL per hour and monitor closely.  #4 hypokalemia/hypomagnesemia Replete.  #5 hypothyroidism Continue IV Synthroid.  #6 depression Stable. Hold Effexor and when tolerating oral intake will resume.  #7 gastroesophageal reflux disease  PPI.  #8 history of left breast cancer Tamoxifen on hold and will resume when tolerating oral intake.   DVT prophylaxis: SCDs Code Status: Full Family Communication: Updated patient. No family at bedside. Disposition Plan: Remain the step down unit. Home when medically stable and pancreatitis has resolved and tolerating oral intake.   Consultants:   [name] 03/03/2016  Procedures:   CT abdomen and pelvis 03/02/2016  Chest x-ray 03/02/2016  Antimicrobials:   IV Primaxin 03/02/2016   Subjective: Patient states she  feels much better. Patient denies any chest pain. Patient states shortness of breath improved significantly from yesterday.  Objective: Filed Vitals:   03/04/16 2000 03/05/16 0000 03/05/16 0400 03/05/16 0408  BP: 145/75 169/62 168/70   Pulse: 65     Temp: 98.8 F (37.1 C) 98.6 F (37 C) 97.9 F (36.6 C)   TempSrc: Oral Oral Oral   Resp: 12 20 15    Height:      Weight:    92.5 kg (203 lb 14.8 oz)  SpO2: 98% 97% 95%     Intake/Output Summary (Last 24 hours) at 03/05/16 0945 Last data filed at 03/05/16 0600  Gross per 24 hour  Intake 1932.5 ml  Output   2400 ml  Net -467.5 ml   Filed Weights   03/03/16 0500 03/04/16 0357 03/05/16 0408  Weight: 92.1 kg (203 lb 0.7 oz) 95.9 kg (211 lb 6.7 oz) 92.5 kg (203 lb 14.8 oz)    Examination:  General exam: Sitting up at bedside.No SOB. Respiratory system: Decreased BS in bases. Fair air movement. Cardiovascular system: S1 & S2 heard, RRR. No JVD, murmurs, rubs, gallops or clicks. Trace - 1 +BLE. Gastrointestinal system: Abdomen is mildly distended, soft and significantly less diffusely tender to palpation. No organomegaly or masses felt. Normal bowel sounds heard. Central nervous system: Alert and oriented. No focal neurological deficits. Extremities: Symmetric 5 x 5 power. Skin: No rashes, lesions or ulcers Psychiatry: Judgement and insight appear normal. Mood & affect appropriate.     Data Reviewed: I have personally reviewed following labs and imaging studies  CBC:  Recent Labs Lab 03/02/16 0041 03/03/16 0004 03/04/16 0304 03/05/16 0239  WBC 18.6* 13.3* 9.2 8.9  NEUTROABS  --   --  5.5  --   HGB 14.1 11.2* 10.0* 10.2*  HCT 43.0 34.7* 30.9* 31.6*  MCV 95.6 96.7 96.3 94.6  PLT 285 227 202 [id number]   Basic Metabolic Panel:  Recent Labs Lab 03/02/16 0041 03/02/16 1041 03/03/16 0004 03/04/16 0304 03/05/16 0239  NA 137  --  142 141 147*  K 3.1*  --  3.9 3.2* 3.1*  CL 98*  --  110 111 104  CO2 25  --  25 21* 25    GLUCOSE 138*  --  120* 75 95  BUN 16  --  8 <5* <5*  CREATININE 0.90  --  0.78 0.68 0.68  CALCIUM 9.7  --  7.8* 7.8* 9.5  MG  --  1.4* 1.6* 2.3 1.7   GFR: Estimated Creatinine Clearance: 74.7 mL/min (by C-G formula based on Cr of 0.68). Liver Function Tests:  Recent Labs Lab 03/02/16 0756 03/04/16 0304 03/05/16 0239  AST 36 89* 48*  ALT 31 60* 51  ALKPHOS 49 86 80  BILITOT 1.2 1.8* 1.1  PROT 5.5* 4.8* 5.5*  ALBUMIN 3.0* 2.3* 2.6*    Recent Labs Lab 03/02/16 0756 03/04/16 0304 03/05/16 0239  LIPASE 59* 32 33   No results for input(s): AMMONIA in the last 168 hours. Coagulation Profile:  Recent Labs Lab 03/02/16 1032  INR 1.10   Cardiac Enzymes: No results for input(s): CKTOTAL, CKMB, CKMBINDEX, TROPONINI in the last 168 hours. BNP (last 3 results) No results for input(s): PROBNP in the last 8760 hours. HbA1C: No results for input(s): HGBA1C in the last 72 hours. CBG: No results for input(s): GLUCAP in the last 168 hours. Lipid Profile:  Recent Labs  03/02/16  1040  CHOL 138  HDL 53  LDLCALC 66  TRIG 94  CHOLHDL 2.6   Thyroid Function Tests: No results for input(s): TSH, T4TOTAL, FREET4, T3FREE, THYROIDAB in the last 72 hours. Anemia Panel: No results for input(s): VITAMINB12, FOLATE, FERRITIN, TIBC, IRON, RETICCTPCT in the last 72 hours. Sepsis Labs:  Recent Labs Lab 03/02/16 1031 03/02/16 1032 03/02/16 1221 03/02/16 2118 03/03/16 0004  PROCALCITON  --  0.57  --   --   --   LATICACIDVEN 1.6  --  2.24* 0.9 1.1    Recent Results (from the past 240 hour(s))  Urine culture     Status: Abnormal   Collection Time: 03/02/16  5:59 AM  Result Value Ref Range Status   Specimen Description URINE, RANDOM  Final   Special Requests NONE  Final   Culture MULTIPLE SPECIES PRESENT, SUGGEST RECOLLECTION (A)  Final   Report Status 03/03/2016 FINAL  Final  Blood Culture (routine x 2)     Status: None (Preliminary result)   Collection Time: 03/02/16  6:05  AM  Result Value Ref Range Status   Specimen Description BLOOD LEFT HAND  Final   Special Requests BOTTLES DRAWN AEROBIC AND ANAEROBIC 5ML  Final   Culture NO GROWTH 2 DAYS  Final   Report Status PENDING  Incomplete  Blood Culture (routine x 2)     Status: None (Preliminary result)   Collection Time: 03/02/16  6:13 AM  Result Value Ref Range Status   Specimen Description BLOOD RIGHT HAND  Final   Special Requests IN PEDIATRIC BOTTLE 2ML  Final   Culture NO GROWTH 2 DAYS  Final   Report Status PENDING  Incomplete  MRSA PCR Screening     Status: None   Collection Time: 03/02/16  6:09 PM  Result Value Ref Range Status   MRSA by PCR NEGATIVE NEGATIVE Final    Comment:        The GeneXpert MRSA Assay (FDA approved for NASAL specimens only), is one component of a comprehensive MRSA colonization surveillance program. It is not intended to diagnose MRSA infection nor to guide or monitor treatment for MRSA infections.          Radiology Studies: No results found.      Scheduled Meds: . imipenem-cilastatin  500 mg Intravenous Q6H  . levothyroxine  25 mcg Intravenous Daily  . magnesium sulfate 1 - 4 g bolus IVPB  3 g Intravenous Once  . potassium chloride  40 mEq Oral Q4H   Continuous Infusions: . dextrose       LOS: 3 days    Time spent: 40 minutes    THOMPSON,DANIEL, MD [facility] Pager [phone]  If 7PM-7AM, please contact night-coverage [email or web address] Password [email or web address] 03/05/2016, 9:45 AM

## 2016-03-06 DIAGNOSIS — R932 Abnormal findings on diagnostic imaging of liver and biliary tract: Secondary | ICD-10-CM

## 2016-03-06 LAB — CBC
HCT: 31.7 % — ABNORMAL LOW (ref 36.0–46.0)
Hemoglobin: 10.3 g/dL — ABNORMAL LOW (ref 12.0–15.0)
MCH: 30.9 pg (ref 26.0–34.0)
MCHC: 32.5 g/dL (ref 30.0–36.0)
MCV: 95.2 fL (ref 78.0–100.0)
Platelets: 257 10*3/uL (ref 150–400)
RBC: 3.33 MIL/uL — ABNORMAL LOW (ref 3.87–5.11)
RDW: 13.5 % (ref 11.5–15.5)
WBC: 8.4 10*3/uL (ref 4.0–10.5)

## 2016-03-06 LAB — COMPREHENSIVE METABOLIC PANEL
ALBUMIN: 2.6 g/dL — AB (ref 3.5–5.0)
ALK PHOS: 68 U/L (ref 38–126)
ALT: 37 U/L (ref 14–54)
AST: 27 U/L (ref 15–41)
Anion gap: 4 — ABNORMAL LOW (ref 5–15)
BUN: <5 mg/dL — ABNORMAL LOW (ref 6–20)
CALCIUM: 8.6 mg/dL — AB (ref 8.9–10.3)
CO2: 26 mmol/L (ref 22–32)
CREATININE: 0.7 mg/dL (ref 0.44–1.00)
Chloride: 112 mmol/L — ABNORMAL HIGH (ref 101–111)
GFR calc Af Amer: >60 mL/min (ref >60)
GFR calc non Af Amer: >60 mL/min (ref >60)
GLUCOSE: 109 mg/dL — AB (ref 65–99)
Potassium: 3.7 mmol/L (ref 3.5–5.1)
SODIUM: 142 mmol/L (ref 135–145)
Total Bilirubin: 0.8 mg/dL (ref 0.3–1.2)
Total Protein: 5.3 g/dL — ABNORMAL LOW (ref 6.5–8.1)

## 2016-03-06 LAB — MAGNESIUM: Magnesium: 1.9 mg/dL (ref 1.7–2.4)

## 2016-03-06 MED ORDER — AMLODIPINE BESYLATE 5 MG PO TABS: 5.0000 mg | ORAL_TABLET | Freq: Every day | ORAL | Status: DC

## 2016-03-06 MED ORDER — AMLODIPINE BESYLATE 5 MG PO TABS
5.0000 mg | ORAL_TABLET | Freq: Every day | ORAL | Status: DC
  Administered 2016-03-06: 5 mg via ORAL
  Filled 2016-03-06: qty 1

## 2016-03-06 NOTE — Progress Notes (Signed)
Patient being discharged home. Patient given discharge instructions. Patient instructed when to make follow-up appts. Patient verbalized understanding. Patient being discharged home with daughter.

## 2016-03-06 NOTE — Progress Notes (Signed)
    Progress Note   Subjective   Tolerated solid diet without difficulty - no pain or nausea Hoping to go home   Objective   Vital signs in last 24 hours: Temp:  [97.8 F (36.6 C)-99.2 F (37.3 C)] 98.3 F (36.8 C) (07/04 0750) Pulse Rate:  [59-72] 59 (07/04 0750) Resp:  [12-19] 12 (07/04 0750) BP: (130-177)/(63-80) 177/80 mmHg (07/04 0929) SpO2:  [96 %-100 %] 98 % (07/04 0750) Weight:  [202 lb 2.6 oz (91.7 kg)] 202 lb 2.6 oz (91.7 kg) (07/04 0400) Last BM Date: 03/05/16 General:    white female in NAD Heart:  Regular rate and rhythm; no murmurs Lungs: Respirations even and unlabored, lungs CTA bilaterally Abdomen:  Soft, nontender and nondistended. Normal bowel sounds. Extremities:  Without edema. Neurologic:  Alert and oriented,  grossly normal neurologically. Psych:  Cooperative. Normal mood and affect.  Intake/Output from previous day: 07/03 0701 - 07/04 0700 In: 1050 [P.O.:600; I.V.:450] Out: 1200 [Urine:1200] Intake/Output this shift:    Lab Results:  Recent Labs  03/04/16 0304 03/05/16 0239 03/06/16 0423  WBC 9.2 8.9 8.4  HGB 10.0* 10.2* 10.3*  HCT 30.9* 31.6* 31.7*  PLT 202 240 257   BMET  Recent Labs  03/04/16 0304 03/05/16 0239 03/06/16 0423  NA 141 147* 142  K 3.2* 3.1* 3.7  CL 111 104 112*  CO2 21* 25 26  GLUCOSE 75 95 109*  BUN <5* <5* <5*  CREATININE 0.68 0.68 0.70  CALCIUM 7.8* 9.5 8.6*   LFT  Recent Labs  03/06/16 0423  PROT 5.3*  ALBUMIN 2.6*  AST 27  ALT 37  ALKPHOS 68  BILITOT 0.8        Assessment / Plan:    #1  65 yo female with acute pancreatitis -resolving. Possibly drug induced- HCTZ  Plan; Pt is stable for discharge home  Needs Low fat diet Stay off HCTZ We will arrange outpt follow up and EUS in 6-8 weeks   Principal Problem:   Severe sepsis ([place]) Active Problems:   GERD (gastroesophageal reflux disease)   Breast cancer, left breast (HCC)   Pancreatitis   Hypokalemia   Depression    Hypothyroidism   Sepsis ([place])   Volume overload   Other acute pancreatitis   Abnormal CT scan, pancreas or bile duct     LOS: 4 days   Amy Esterwood  03/06/2016, 11:21 AM  GI ATTENDING  Interval history data reviewed. Agree with interval progress note. Patient doing well from pancreatitis standpoint. Okay to go home later today if doing well. She should follow-up with me in the office in about 4 weeks. Stay off hydrochlorothiazide. Eventual EUS. Discussed with attending Dr. Grandville Silos.  Docia Chuck. Geri Seminole., M.D. [facility] Division of Gastroenterology

## 2016-03-06 NOTE — Discharge Summary (Signed)
Physician Discharge Summary  [name] [id number] DOB: September 16, 1950 DOA: 03/02/2016  PCP: Ana Peaches, MD  Admit date: 03/02/2016 Discharge date: 03/06/2016  Time spent: 65 minutes  Recommendations for Outpatient Follow-up:  1. Follow-up with [name] of gastroenterology in one month. Is recommended that patient stay off hydrochlorothiazide. Felt the patient will likely need an eventual EUS which will be decided on follow-up visit. 2. Follow-up with Sanchez, Ana JAY, MD in 2 weeks. On follow-up patient will need a basic metabolic profile done to follow-up on electrolytes and renal function. Patient also need a CBC done to follow-up on H&H. Patient's blood pressure needs to be reassessed as patient's hydrochlorothiazide was discontinued as was felt was likely etiology of patient's pancreatitis. Patient has been started on Norvasc for better blood pressure control.   Discharge Diagnoses:  Principal Problem:   Severe sepsis ([place]) Active Problems:   Pancreatitis   GERD (gastroesophageal reflux disease)   Breast cancer, left breast (HCC)   Hypokalemia   Depression   Hypothyroidism   Sepsis (HCC)   Volume overload   Other acute pancreatitis   Abnormal CT scan, pancreas or bile duct   Discharge Condition: Stable and improved  Diet recommendation: Soft, low-fat diet  Filed Weights   03/04/16 0357 03/05/16 0408 03/06/16 0400  Weight: 95.9 kg (211 lb 6.7 oz) 92.5 kg (203 lb 14.8 oz) 91.7 kg (202 lb 2.6 oz)    History of present illness:  Per Dr Waldron Labs [name] is a 65 y.o. female with medical history significant for but not limited to, left breast cancer, depression, and GERD. The night Prior to admission around 7pm patient suddently developed N/V and epigastric pain radiating around RUQ to right mid back. Pain was constant, unrelated to position. Patient has never had this type pain before. No diarrhea. No chills at home but some upon arrival to ED. Pain better at  present after pain meds. In normal state of health until last evening. CT scan revealled pancreatitis. Patient rarely drinks ETOH. She did recently start BP med.   Hospital Course:  #1 sepsis secondary to acute pancreatitis with early pseudocyst formation Patient admitted with nausea, vomiting abdominal pain CT abdomen and pelvis consistent with an acute pancreatitis with early pseudocyst formation.Meeting criteria for sepsis with elevated lactic acid level of 4.37, leukocytosis with a white count of 18,000, tachycardic with borderline blood pressure. Patient currently afebrile. Leukocytosis trending down. Lactic acid level trending down. Blood cultures pending. Patient was placed empirically on IV Primaxin. IV Primaxin was subsequently discontinued. Patient was hydrated aggressively with IV fluids. Patient improved clinically and will be discharged in stable and improved condition.  #2 acute pancreatitis with early pseudocyst formation Per CT abdomen and pelvis. Likely Secondary to HCTZ . Clinically improving. CT abdomen and pelvis with mild intra-next or hepatic biliary dilatation following cholecystectomy. Lipase level was 59 on admission with normal LFTs.Lipase levels trending down. Patient with no prior history of pancreatitis. Patient denies any ongoing daily heavy alcohol use. Fasting lipid panel triglycerides of 94. Patient with clinical improvement. IV fluid rate has been decreased by gastroenterology area patient has been started on clear liquids. Changed IV Dilaudid to IV morphine as patient states IV Dilaudid causing nausea about of emesis. Patient was placed in the step unit hydrated aggressively with IV fluids and also started on IV Primaxin. GI was consulted and followed the patient throughout the hospitalization. Patient improved clinically and IV fluid rate was decreased due to volume overload. Patient  diuresed Continue empwell during the hospitalization. Patient improved clinically.  Abdominal pain improved. Patient was started on clear liquids and diet advanced to a soft diet which she tolerated. Patient be discharged home in stable and improved condition and is to follow-up.  Gastroenterologist primary care physician. iric IV Primaxin. Per GI patient likely needs repeat CT abdomen and pelvis will defer timing to GI. Decreased IV fluids last night due to volume overload. GI following and appreciate input and recommendations. Patient tolerated clear liquids. Diet has been advanced to full liquids.  #3 volume overload Secondary to aggressive fluid resuscitation secondary to acute pancreatitis. Patient given Lasix Lasix was within the left noted to be visibly short of breath was given Lasix 40 mg IV 1 with good diuresis with a urine output of 2.4 L. Patient with significant clinical improvement. Patient stated shortness of breath has improved significantly. IV fluids were decreased. IV fluids subsequently discontinued. Patient oral diuresis and and improved clinically by day of discharge.  #4 hypokalemia/hypomagnesemia Repleted.  #5 hypothyroidism Patient was placed on IV stairs Synthroid during the hospitalization and will be resumed on the right as patient improved clinically was transition to oral Synthroid. Outpatient follow-up.   #6 depression Stable. Held Effexor during the hospitalization as patient was nothing by mouth. Effexor will be resumed on discharge.  #7 gastroesophageal reflux disease Patient was maintained on a PPI.  #8 history of left breast cancer Tamoxifen on hold and will resume on discharge.  Procedures:  CT abdomen and pelvis 03/02/2016  Chest x-ray 03/02/2016  Consultations:  [name] 03/03/2016  Discharge Exam: Filed Vitals:   03/06/16 1144 03/06/16 1600  BP: 159/96 143/90  Pulse:    Temp: 98.2 F (36.8 C) 98.3 F (36.8 C)  Resp: 16 22    General: NAD Cardiovascular: RRR Respiratory: CTAB  Discharge  Instructions   Discharge Instructions    Diet - low sodium heart healthy    Complete by:  As directed   Low fat diet. Soft food.     Discharge instructions    Complete by:  As directed   Follow up with Dr Henrene Pastor in 1 month. Follow up with Sanchez, Keenan Bachelor, MD in 2 weeks.     Increase activity slowly    Complete by:  As directed           Current Discharge Medication List    START taking these medications   Details  amLODipine (NORVASC) 5 MG tablet Take 1 tablet (5 mg total) by mouth daily. Qty: 30 tablet, Refills: 0      CONTINUE these medications which have NOT CHANGED   Details  aspirin 81 MG chewable tablet Chew 81 mg by mouth daily.    Biotin 5000 MCG TABS Take 1 tablet by mouth daily.    Calcium Carbonate-Vitamin D (CALCIUM 600 + D PO) Take 2 tablets by mouth daily.      Cholecalciferol (VITAMIN D) 2000 units tablet Take 2,000 Units by mouth daily.    esomeprazole (NEXIUM) 20 MG capsule Take 20 mg by mouth daily as needed (For heartburn.).     levothyroxine (SYNTHROID, LEVOTHROID) 50 MCG tablet Take 50 mcg by mouth daily.    Multiple Vitamins-Minerals (CENTRUM SILVER PO) Take 1 tablet by mouth daily.     tamoxifen (NOLVADEX) 20 MG tablet Take 1 tablet (20 mg total) by mouth daily. Qty: 90 tablet, Refills: 3   Associated Diagnoses: Breast cancer of upper-outer quadrant of left female breast ([place])  venlafaxine XR (EFFEXOR-XR) 75 MG 24 hr capsule Take 75 mg by mouth daily.      STOP taking these medications     hydrochlorothiazide (HYDRODIURIL) 25 MG tablet      docusate sodium (COLACE) 100 MG capsule        Allergies  Allergen Reactions  . Other Itching and Rash    Patient states that she was unable to use supportive wrap they provided at [facility] following a breast removal and reconstruction.   Follow-up Information    Follow up with Scarlette Shorts, MD. Schedule an appointment as soon as possible for a visit in 1 month.   Specialty:  Gastroenterology    Contact information:   [address] [place] [address] [phone]       Follow up with Sanchez, Keenan Bachelor, MD. Schedule an appointment as soon as possible for a visit in 2 weeks.   Specialty:  Internal Medicine   Contact information:   [address] [place] [address] [phone]        The results of significant diagnostics from this hospitalization (including imaging, microbiology, ancillary and laboratory) are listed below for reference.    Significant Diagnostic Studies: Dg Chest 2 View  03/02/2016  CLINICAL DATA:  Chest pain and dyspnea. EXAM: CHEST  2 VIEW COMPARISON:  12/31/2014 FINDINGS: The heart size and mediastinal contours are within normal limits. Both lungs are clear. The visualized skeletal structures are unremarkable. IMPRESSION: No active cardiopulmonary disease. Electronically Signed   By: Andreas Newport M.D.   On: 03/02/2016 01:25   Ct Abdomen Pelvis W Contrast  03/02/2016  CLINICAL DATA:  Severe epigastric pain with nausea and vomiting. EXAM: CT ABDOMEN AND PELVIS WITH CONTRAST TECHNIQUE: Multidetector CT imaging of the abdomen and pelvis was performed using the standard protocol following bolus administration of intravenous contrast. CONTRAST:  100 ISOVUE-300 IOPAMIDOL (ISOVUE-300) INJECTION 61% COMPARISON:  CT of the abdomen and pelvis 10/19/2011 FINDINGS: Lower chest: Mild dependent atelectasis is present in the lungs bilaterally. The heart size is normal. A left breast prosthesis is noted. Hepatobiliary: Mild intra and extrahepatic biliary dilation is present following cholecystectomy. No discrete hepatic lesions are present. Pancreas: There is marked inflammatory change at the head of pancreas. No discrete mass lesion is present. The more distal pancreas is unremarkable. The tail the pancreas is markedly atrophic as previously seen. There is gas in the head of the pancreas with potential early pseudocyst formation. Spleen: Within normal  limits. Adrenals/Urinary Tract: The adrenal glands are normal bilaterally. Kidneys and ureters are unremarkable. The urinary bladder is within normal limits. Stomach/Bowel: A small hiatal hernia is present. The stomach otherwise unremarkable. Secondary inflammatory changes are present in the second-third portions of the duodenum. The small bowel is within normal limits. A small amount of free fluid is noted in the retroperitoneum on the right. The appendix is visualized and normal. The ascending and transverse colon are within normal limits. The descending and rectosigmoid colon are unremarkable. Vascular/Lymphatic: An 8 mm short axis peripancreatic lymph node is present. Subcentimeter portacaval nodes are present. No other significant adenopathy is present. Atherosclerotic changes are noted in the descending abdominal aorta and branch vessels. Reproductive: Calcifications in the anterior fundus of the uterus are compatible with a fibroid. There is some fluid in the endometrial canal, atypical for age. No discrete mass lesion is present. The adnexa are within normal limits for age. Other: No other significant layering intraperitoneal free fluid is present. Musculoskeletal: A  vacuum disc is present at L5-S1. Vertebral bodies are otherwise normal aligned. No other focal disc disease is present. IMPRESSION: 1. Diffuse inflammatory changes within the head of the pancreas compatible with acute pancreatitis. 2. Early pseudocyst formation suggested with areas of fluid and gas. No discrete measurable abscess is present. 3. Reactive sized peripancreatic lymph nodes. 4. Small hiatal hernia. 5. Layering fluid in the retroperitoneum on the right. 6. Uterine calcification, likely fibroid. 7. A small amount of fluid is present within the uterus, atypical for age. Please correlate with previous endometrial biopsy. These results were called by telephone at the time of interpretation on 03/02/2016 at 7:37 am to Dr. Christy Gentles , who  verbally acknowledged these results. Electronically Signed   By: San Morelle M.D.   On: 03/02/2016 07:40    Microbiology: Recent Results (from the past 240 hour(s))  Urine culture     Status: Abnormal   Collection Time: 03/02/16  5:59 AM  Result Value Ref Range Status   Specimen Description URINE, RANDOM  Final   Special Requests NONE  Final   Culture MULTIPLE SPECIES PRESENT, SUGGEST RECOLLECTION (A)  Final   Report Status 03/03/2016 FINAL  Final  Blood Culture (routine x 2)     Status: None (Preliminary result)   Collection Time: 03/02/16  6:05 AM  Result Value Ref Range Status   Specimen Description BLOOD LEFT HAND  Final   Special Requests BOTTLES DRAWN AEROBIC AND ANAEROBIC 5ML  Final   Culture NO GROWTH 4 DAYS  Final   Report Status PENDING  Incomplete  Blood Culture (routine x 2)     Status: None (Preliminary result)   Collection Time: 03/02/16  6:13 AM  Result Value Ref Range Status   Specimen Description BLOOD RIGHT HAND  Final   Special Requests IN PEDIATRIC BOTTLE 2ML  Final   Culture NO GROWTH 4 DAYS  Final   Report Status PENDING  Incomplete  MRSA PCR Screening     Status: None   Collection Time: 03/02/16  6:09 PM  Result Value Ref Range Status   MRSA by PCR NEGATIVE NEGATIVE Final    Comment:        The GeneXpert MRSA Assay (FDA approved for NASAL specimens only), is one component of a comprehensive MRSA colonization surveillance program. It is not intended to diagnose MRSA infection nor to guide or monitor treatment for MRSA infections.      Labs: Basic Metabolic Panel:  Recent Labs Lab 03/02/16 0041 03/02/16 1041 03/03/16 0004 03/04/16 0304 03/05/16 0239 03/06/16 0423  NA 137  --  142 141 147* 142  K 3.1*  --  3.9 3.2* 3.1* 3.7  CL 98*  --  110 111 104 112*  CO2 25  --  25 21* 25 26  GLUCOSE 138*  --  120* 75 95 109*  BUN 16  --  8 <5* <5* <5*  CREATININE 0.90  --  0.78 0.68 0.68 0.70  CALCIUM 9.7  --  7.8* 7.8* 9.5 8.6*  MG  --   1.4* 1.6* 2.3 1.7 1.9   Liver Function Tests:  Recent Labs Lab 03/02/16 0756 03/04/16 0304 03/05/16 0239 03/06/16 0423  AST 36 89* 48* 27  ALT 31 60* 51 37  ALKPHOS 49 86 80 68  BILITOT 1.2 1.8* 1.1 0.8  PROT 5.5* 4.8* 5.5* 5.3*  ALBUMIN 3.0* 2.3* 2.6* 2.6*    Recent Labs Lab 03/02/16 0756 03/04/16 0304 03/05/16 0239  LIPASE 59* 32 33   No  results for input(s): AMMONIA in the last 168 hours. CBC:  Recent Labs Lab 03/02/16 0041 03/03/16 0004 03/04/16 0304 03/05/16 0239 03/06/16 0423  WBC 18.6* 13.3* 9.2 8.9 8.4  NEUTROABS  --   --  5.5  --   --   HGB 14.1 11.2* 10.0* 10.2* 10.3*  HCT 43.0 34.7* 30.9* 31.6* 31.7*  MCV 95.6 96.7 96.3 94.6 95.2  PLT 285 227 202 240 257   Cardiac Enzymes: No results for input(s): CKTOTAL, CKMB, CKMBINDEX, TROPONINI in the last 168 hours. BNP: BNP (last 3 results) No results for input(s): BNP in the last 8760 hours.  ProBNP (last 3 results) No results for input(s): PROBNP in the last 8760 hours.  CBG: No results for input(s): GLUCAP in the last 168 hours.     SignedIrine Seal MD.  [facility] 03/06/2016, 5:11 PM

## 2016-03-06 NOTE — Progress Notes (Signed)
Patient's SBP >160. Text paged on call [facility]. Awaiting orders or call back.

## 2016-03-07 LAB — CULTURE, BLOOD (ROUTINE X 2)
CULTURE: NO GROWTH
Culture: NO GROWTH

## 2016-03-08 ENCOUNTER — Telehealth: Payer: Self-pay

## 2016-03-08 NOTE — Telephone Encounter (Signed)
Pt scheduled for OV with [name] 04/25/16@10am . Appt letter mailed to pt.

## 2016-03-08 NOTE — Telephone Encounter (Signed)
-----   Message from Alfredia Ferguson, PA-C sent at 03/08/2016  8:38 AM EDT ----- Regarding: FW: office follow up and EUS Please get scheduled ----- Message -----    From: Irene Shipper, MD    Sent: 03/06/2016   2:11 PM      To: Amy S Esterwood, PA-C Subject: RE: office follow up and EUS                   I want to see her in the office in about 4 weeks. Please help arrange ----- Message -----    From: Alfredia Ferguson, PA-C    Sent: 03/06/2016  11:33 AM      To: Irene Shipper, MD, Algernon Huxley, RN Subject: office follow up and EUS                        JP - wasn't sure if you wanted pt seen in office or not , and reminding you she needs scheduled  For EUS in 6 weeks  For pancreatitis .Marland KitchenMarland Kitchen

## 2016-03-15 DIAGNOSIS — I119 Hypertensive heart disease without heart failure: Secondary | ICD-10-CM | POA: Diagnosis not present

## 2016-03-15 DIAGNOSIS — K859 Acute pancreatitis without necrosis or infection, unspecified: Secondary | ICD-10-CM | POA: Diagnosis not present

## 2016-03-15 DIAGNOSIS — D559 Anemia due to enzyme disorder, unspecified: Secondary | ICD-10-CM | POA: Diagnosis not present

## 2016-03-29 ENCOUNTER — Inpatient Hospital Stay (HOSPITAL_COMMUNITY): Payer: Medicare Other

## 2016-03-29 ENCOUNTER — Inpatient Hospital Stay (HOSPITAL_COMMUNITY)
Admission: AD | Admit: 2016-03-29 | Discharge: 2016-04-01 | DRG: 603 | Disposition: A | Discharge status: Home / Self Care | Payer: Medicare Other | Source: Ambulatory Visit | Attending: Internal Medicine | Admitting: Internal Medicine

## 2016-03-29 ENCOUNTER — Encounter (HOSPITAL_COMMUNITY): Payer: Self-pay | Admitting: General Practice

## 2016-03-29 DIAGNOSIS — E876 Hypokalemia: Secondary | ICD-10-CM | POA: Diagnosis present

## 2016-03-29 DIAGNOSIS — I10 Essential (primary) hypertension: Secondary | ICD-10-CM | POA: Diagnosis present

## 2016-03-29 DIAGNOSIS — Z853 Personal history of malignant neoplasm of breast: Secondary | ICD-10-CM

## 2016-03-29 DIAGNOSIS — E039 Hypothyroidism, unspecified: Secondary | ICD-10-CM | POA: Diagnosis not present

## 2016-03-29 DIAGNOSIS — Z7982 Long term (current) use of aspirin: Secondary | ICD-10-CM | POA: Diagnosis not present

## 2016-03-29 DIAGNOSIS — K859 Acute pancreatitis without necrosis or infection, unspecified: Secondary | ICD-10-CM | POA: Diagnosis present

## 2016-03-29 DIAGNOSIS — Z9842 Cataract extraction status, left eye: Secondary | ICD-10-CM

## 2016-03-29 DIAGNOSIS — F329 Major depressive disorder, single episode, unspecified: Secondary | ICD-10-CM | POA: Diagnosis present

## 2016-03-29 DIAGNOSIS — F419 Anxiety disorder, unspecified: Secondary | ICD-10-CM | POA: Diagnosis present

## 2016-03-29 DIAGNOSIS — B029 Zoster without complications: Secondary | ICD-10-CM | POA: Diagnosis not present

## 2016-03-29 DIAGNOSIS — K219 Gastro-esophageal reflux disease without esophagitis: Secondary | ICD-10-CM | POA: Diagnosis present

## 2016-03-29 DIAGNOSIS — J439 Emphysema, unspecified: Secondary | ICD-10-CM | POA: Diagnosis not present

## 2016-03-29 DIAGNOSIS — Z79810 Long term (current) use of selective estrogen receptor modulators (SERMs): Secondary | ICD-10-CM

## 2016-03-29 DIAGNOSIS — Z79899 Other long term (current) drug therapy: Secondary | ICD-10-CM | POA: Diagnosis not present

## 2016-03-29 DIAGNOSIS — Z87891 Personal history of nicotine dependence: Secondary | ICD-10-CM

## 2016-03-29 DIAGNOSIS — L03313 Cellulitis of chest wall: Secondary | ICD-10-CM | POA: Diagnosis not present

## 2016-03-29 DIAGNOSIS — F32A Depression, unspecified: Secondary | ICD-10-CM | POA: Diagnosis present

## 2016-03-29 DIAGNOSIS — C50412 Malignant neoplasm of upper-outer quadrant of left female breast: Secondary | ICD-10-CM | POA: Diagnosis present

## 2016-03-29 DIAGNOSIS — Z9882 Breast implant status: Secondary | ICD-10-CM | POA: Diagnosis not present

## 2016-03-29 DIAGNOSIS — D649 Anemia, unspecified: Secondary | ICD-10-CM | POA: Diagnosis present

## 2016-03-29 HISTORY — DX: Essential (primary) hypertension: I10

## 2016-03-29 HISTORY — DX: Cellulitis of chest wall: L03.313

## 2016-03-29 HISTORY — DX: Malignant neoplasm of unspecified site of left female breast: C50.912

## 2016-03-29 HISTORY — DX: Hypothyroidism, unspecified: E03.9

## 2016-03-29 HISTORY — DX: Acute pancreatitis without necrosis or infection, unspecified: K85.90

## 2016-03-29 LAB — COMPREHENSIVE METABOLIC PANEL
ALK PHOS: 56 U/L (ref 38–126)
ALT: 35 U/L (ref 14–54)
ANION GAP: 8 (ref 5–15)
AST: 32 U/L (ref 15–41)
Albumin: 3.3 g/dL — ABNORMAL LOW (ref 3.5–5.0)
BILIRUBIN TOTAL: 0.8 mg/dL (ref 0.3–1.2)
BUN: 9 mg/dL (ref 6–20)
CALCIUM: 8.6 mg/dL — AB (ref 8.9–10.3)
CO2: 24 mmol/L (ref 22–32)
CREATININE: 0.74 mg/dL (ref 0.44–1.00)
Chloride: 108 mmol/L (ref 101–111)
Glucose, Bld: 101 mg/dL — ABNORMAL HIGH (ref 65–99)
Potassium: 4 mmol/L (ref 3.5–5.1)
Sodium: 140 mmol/L (ref 135–145)
TOTAL PROTEIN: 5.6 g/dL — AB (ref 6.5–8.1)

## 2016-03-29 LAB — CBC WITH DIFFERENTIAL/PLATELET
Basophils Absolute: 0 10*3/uL (ref 0.0–0.1)
Basophils Relative: 0 %
EOS ABS: 0.4 10*3/uL (ref 0.0–0.7)
Eosinophils Relative: 3 %
HEMATOCRIT: 34.4 % — AB (ref 36.0–46.0)
HEMOGLOBIN: 11.4 g/dL — AB (ref 12.0–15.0)
LYMPHS ABS: 2.9 10*3/uL (ref 0.7–4.0)
LYMPHS PCT: 24 %
MCH: 30.9 pg (ref 26.0–34.0)
MCHC: 33.1 g/dL (ref 30.0–36.0)
MCV: 93.2 fL (ref 78.0–100.0)
MONOS PCT: 7 %
Monocytes Absolute: 0.8 10*3/uL (ref 0.1–1.0)
NEUTROS PCT: 66 %
Neutro Abs: 8.2 10*3/uL — ABNORMAL HIGH (ref 1.7–7.7)
Platelets: 215 10*3/uL (ref 150–400)
RBC: 3.69 MIL/uL — AB (ref 3.87–5.11)
RDW: 13.4 % (ref 11.5–15.5)
WBC: 12.3 10*3/uL — AB (ref 4.0–10.5)

## 2016-03-29 LAB — CK: Total CK: 31 U/L — ABNORMAL LOW (ref 38–234)

## 2016-03-29 LAB — PROTIME-INR
INR: 1
Prothrombin Time: 13.2 seconds (ref 11.4–15.2)

## 2016-03-29 LAB — PHOSPHORUS: PHOSPHORUS: 3.3 mg/dL (ref 2.5–4.6)

## 2016-03-29 LAB — MAGNESIUM: MAGNESIUM: 1.8 mg/dL (ref 1.7–2.4)

## 2016-03-29 LAB — SEDIMENTATION RATE: Sed Rate: 5 mm/hr (ref 0–22)

## 2016-03-29 LAB — APTT: aPTT: 31 seconds (ref 24–36)

## 2016-03-29 LAB — LACTIC ACID, PLASMA: Lactic Acid, Venous: 1.5 mmol/L (ref 0.5–1.9)

## 2016-03-29 MED ORDER — LEVOTHYROXINE SODIUM 50 MCG PO TABS: 50.0000 ug | ORAL_TABLET | Freq: Every day | ORAL | Status: DC

## 2016-03-29 MED ORDER — VENLAFAXINE HCL ER 75 MG PO CP24
75.0000 mg | ORAL_CAPSULE | Freq: Every day | ORAL | Status: DC
  Filled 2016-03-29 (×2): qty 1

## 2016-03-29 MED ORDER — ENOXAPARIN SODIUM 40 MG/0.4ML ~~LOC~~ SOLN
40.0000 mg | SUBCUTANEOUS | Status: DC
  Administered 2016-03-29 – 2016-03-31 (×3): 40 mg via SUBCUTANEOUS
  Filled 2016-03-29 (×3): qty 0.4

## 2016-03-29 MED ORDER — SODIUM CHLORIDE 0.9 % IV SOLN
INTRAVENOUS | Status: DC
  Administered 2016-03-29: 19:00:00 via INTRAVENOUS

## 2016-03-29 MED ORDER — MORPHINE SULFATE (PF) 2 MG/ML IV SOLN
1.0000 mg | INTRAVENOUS | Status: DC | PRN
  Filled 2016-03-29: qty 1

## 2016-03-29 MED ORDER — VITAMIN D 1000 UNITS PO TABS
2000.0000 [IU] | ORAL_TABLET | Freq: Every day | ORAL | Status: DC
  Administered 2016-03-29 – 2016-04-01 (×4): 2000 [IU] via ORAL
  Filled 2016-03-29 (×4): qty 2

## 2016-03-29 MED ORDER — OXYCODONE HCL 5 MG PO TABS
5.0000 mg | ORAL_TABLET | ORAL | Status: DC | PRN
Start: 2016-03-29 — End: 2016-04-01
  Administered 2016-03-29 – 2016-03-30 (×3): 5 mg via ORAL
  Filled 2016-03-29 (×3): qty 1

## 2016-03-29 MED ORDER — ASPIRIN 81 MG PO CHEW
81.0000 mg | CHEWABLE_TABLET | Freq: Every day | ORAL | Status: DC
  Administered 2016-03-29 – 2016-04-01 (×4): 81 mg via ORAL
  Filled 2016-03-29 (×4): qty 1

## 2016-03-29 MED ORDER — PIPERACILLIN-TAZOBACTAM 3.375 G IVPB
3.3750 g | Freq: Three times a day (TID) | INTRAVENOUS | Status: DC
  Administered 2016-03-29 – 2016-03-31 (×6): 3.375 g via INTRAVENOUS
  Filled 2016-03-29 (×7): qty 50

## 2016-03-29 MED ORDER — BIOTIN 5000 MCG PO TABS: 1.0000 | ORAL_TABLET | Freq: Every day | ORAL | Status: DC

## 2016-03-29 MED ORDER — PANTOPRAZOLE SODIUM 40 MG PO TBEC
40.0000 mg | DELAYED_RELEASE_TABLET | Freq: Every day | ORAL | Status: DC
  Administered 2016-03-30 – 2016-04-01 (×3): 40 mg via ORAL
  Filled 2016-03-29 (×3): qty 1

## 2016-03-29 MED ORDER — IOPAMIDOL (ISOVUE-300) INJECTION 61%
INTRAVENOUS | Status: AC
Start: 2016-03-29 — End: 2016-03-29
  Administered 2016-03-29: 75 mL
  Filled 2016-03-29: qty 75

## 2016-03-29 MED ORDER — LEVOTHYROXINE SODIUM 50 MCG PO TABS
50.0000 ug | ORAL_TABLET | Freq: Every day | ORAL | Status: DC
  Administered 2016-03-30 – 2016-04-01 (×3): 50 ug via ORAL
  Filled 2016-03-29 (×3): qty 1

## 2016-03-29 MED ORDER — VANCOMYCIN HCL IN DEXTROSE 1-5 GM/200ML-% IV SOLN: 1000.0000 mg | Freq: Once | INTRAVENOUS | Status: DC

## 2016-03-29 MED ORDER — ACETAMINOPHEN 325 MG PO TABS
650.0000 mg | ORAL_TABLET | Freq: Four times a day (QID) | ORAL | Status: DC | PRN
  Administered 2016-03-29 – 2016-03-30 (×3): 650 mg via ORAL
  Filled 2016-03-29 (×3): qty 2

## 2016-03-29 MED ORDER — VANCOMYCIN HCL IN DEXTROSE 1-5 GM/200ML-% IV SOLN
1000.0000 mg | Freq: Two times a day (BID) | INTRAVENOUS | Status: DC
  Administered 2016-03-29 – 2016-03-31 (×4): 1000 mg via INTRAVENOUS
  Filled 2016-03-29 (×6): qty 200

## 2016-03-29 MED ORDER — ACETAMINOPHEN 650 MG RE SUPP: 650.0000 mg | Freq: Four times a day (QID) | RECTAL | Status: DC | PRN

## 2016-03-29 NOTE — Progress Notes (Signed)
Patient wanted IV team to start her IV / patient difficult  stick

## 2016-03-29 NOTE — Progress Notes (Addendum)
Pharmacy Antibiotic Note  [name] is a 65 y.o. female admitted on 03/29/2016 with cellulitis.  Pharmacy has been consulted for vancomycin zosyn dosing.  Plan: Vancomycin 1g IV every 12 hours.  Goal trough 10-15 mcg/mL. Zosyn 3.375g IV q8h (4 hour infusion).  Monitor culture data, renal function and clinical course VT at SS prn     Temp (24hrs), Avg:99.3 F (37.4 C), Min:99.3 F (37.4 C), Max:99.3 F (37.4 C)  No results for input(s): WBC, CREATININE, LATICACIDVEN, VANCOTROUGH, VANCOPEAK, VANCORANDOM, GENTTROUGH, GENTPEAK, GENTRANDOM, TOBRATROUGH, TOBRAPEAK, TOBRARND, AMIKACINPEAK, AMIKACINTROU, AMIKACIN in the last 168 hours.  CrCl cannot be calculated (Unknown ideal weight.).    Allergies  Allergen Reactions  . Other Itching and Rash    Patient states that she was unable to use supportive wrap they provided at [facility] following a breast removal and reconstruction.    Antimicrobials this admission: Vanc 7/27 >>  Zosyn 7/27 >>   Andrey Cota. Diona Foley, PharmD, [place] Clinical Pharmacist Pager [phone] 03/29/2016 5:17 PM

## 2016-03-29 NOTE — H&P (Signed)
History and Physical    [name]:[id number] DOB: 10/30/1950 DOA: 03/29/2016   PCP: [name], MD   Patient coming from/Resides with: Private residence/lives with mother and she cares for  Chief Complaint: Rapidly evolving left chest wall cellulitis  HPI: [name] is a 65 y.o. female with medical history significant for left breast cancer status post reconstructive surgery August 2016 with subsequent treatment for flap infection September 2016. She also has a history of HTN, anxiety disorder and depression as well as GERD. Patient was recently hospitalized earlier this month or idiopathic pancreatitis felt potentially related to hydrochlorothiazide. She is scheduled to follow-up with [facility]/[name] on 04/25/16 at 10 AM to discuss whether EUS is indicated to further evaluate the etiology of her pancreatitis.   Patient was sent to the hospital for direct admission from the plastic surgeon's office for left chest wall cellulitis. According to the patient she began having pain underneath her left breast/left upper lateral chest wall last night and noticed a small red spot. She awakened this morning with an extensive area of cellulitic skin changes involving the left breast and most of the left anterior chest wall swelling is extending into the left lateral chest into the left upper back. The area was exquisitely tender to touch. She was running low-grade fevers. She reports she is also having pain in the left shoulder extending into the left upper arm as of this morning. She's not had any nausea or vomiting. The only other symptom she has been having recently is over the past 1 week she's had increased swelling in her feet without any shortness of breath orthopnea. She reports she adheres to a low sodium diet.   Review of Systems:  In addition to the HPI above,  No Headache, changes with Vision or hearing, new weakness, tingling, numbness in any extremity, No problems  swallowing food or Liquids, indigestion/reflux No Chest pain, Cough or Shortness of Breath, palpitations, orthopnea or DOE No Abdominal pain, N/V; no melena or hematochezia, no dark tarry stools,  No dysuria, hematuria or flank pains, No new joints pains-aches No recent weight gain or loss No polyuria, polydypsia or polyphagia,   Past Medical History:  Diagnosis Date  . Anxiety   . Cancer [facility])    breast, left  . Chronic kidney disease    released from [facility] feb 16 states blood found in urine in past-tests done but could not find reason for blood  . Depression   . Ductal carcinoma in situ (DCIS) of left breast   12/08/14   Left Upper Outer Quadrant  . GERD (gastroesophageal reflux disease)   . Heart murmur    baby  . PONV (postoperative nausea and vomiting)    with cataract surgery and ist lumpectomy req patch and iv med for nausea    Past Surgical History:  Procedure Laterality Date  . BREAST LUMPECTOMY WITH RADIOACTIVE SEED LOCALIZATION Left 01/04/2015   Procedure: LEFT PARTIAL MASTECTOMY WITH RADIOACTIVE SEED LOCALIZATION;  Surgeon: Fanny Skates, MD;  Location: [place];  Service: General;  Laterality: Left;  . BREAST RECONSTRUCTION WITH PLACEMENT OF TISSUE EXPANDER AND FLEX HD (ACELLULAR HYDRATED DERMIS) Left 04/07/2015   Procedure: IMMEDIATE LEFT BREAST RECONSTRUCTION WITH PLACEMENT SALINE IMPLANT AND CHEST WALL RECONSTRUCTION WITH  ACELLULAR DERMAL MATRIX ;  Surgeon: Crissie Reese, MD;  Location: [place];  Service: Plastics;  Laterality: Left;  . CATARACT EXTRACTION    . cataracts l eye Left   . CHOLECYSTECTOMY  09/17/2011   Procedure: LAPAROSCOPIC CHOLECYSTECTOMY WITH INTRAOPERATIVE CHOLANGIOGRAM;  Surgeon: [name], MD;  Location: [place];  Service: General;  Laterality: N/A;  carm   . DENTAL SURGERY     rt upper tooth removed  and crown? placed  . EYE SURGERY    . HYSTEROSCOPY W/D&C N/A 10/28/2015   Procedure: DILATATION AND CURETTAGE /HYSTEROSCOPY, Myosure;  Surgeon:  Alden Hipp, MD;  Location: [facility];  Service: Gynecology;  Laterality: N/A;  . Left Breast Biopsy  12/08/14  . MASTECTOMY W/ SENTINEL NODE BIOPSY Left 04/07/2015   nipple sparing   . NIPPLE SPARING MASTECTOMY/SENTINAL LYMPH NODE BIOPSY/RECONSTRUCTION/PLACEMENT OF TISSUE EXPANDER Left 04/07/2015   Procedure: LEFT NIPPLE SPARING MASTECTOMY WITH LEFT SENTINAL LYMPH NODE BIOPSY AND  RECONSTRUCTION WITH PLACEMENT OF TISSUE EXPANDER;  Surgeon: Fanny Skates, MD;  Location: [place];  Service: General;  Laterality: Left;  . RE-EXCISION OF BREAST LUMPECTOMY Left 01/12/2015   Procedure: LEFT  BREAST LUMPECTOMY RE-EXCISION OF MARGINS;  Surgeon: Fanny Skates, MD;  Location: [place];  Service: General;  Laterality: Left;    Social History   Social History  . Marital status: Divorced    Spouse name: N/A  . Number of children: 1  . Years of education: N/A   Occupational History  . Retired    Social History Main Topics  . Smoking status: Former Smoker    Packs/day: 0.50    Years: 15.00    Types: Cigarettes    Quit date: 11/02/2014  . Smokeless tobacco: Never Used  . Alcohol use 0.6 oz/week    1 Glasses of wine per week     Comment: occassional  . Drug use: No  . Sexual activity: Not on file     Comment: Did not ask   Other Topics Concern  . Not on file   Social History Narrative  . No narrative on file    Mobility: Without assistive devices Work history: Retired   Allergies  Allergen Reactions  . Other Itching and Rash    Patient states that she was unable to use supportive wrap they provided at [facility] following a breast removal and reconstruction.    Family History  Problem Relation Age of Onset  . Heart disease Mother   . Hypertension Mother   . Leukemia Father   . Heart disease Father   . Breast cancer Maternal Aunt   . Colon cancer      Father's aunt  . Cancer Brother      Prior to Admission medications   Medication Sig Start Date End Date  Taking? Authorizing Provider  amLODipine (NORVASC) 5 MG tablet Take 1 tablet (5 mg total) by mouth daily. 03/06/16   Eugenie Filler, MD  aspirin 81 MG chewable tablet Chew 81 mg by mouth daily.    Historical Provider, MD  Biotin 5000 MCG TABS Take 1 tablet by mouth daily.    Historical Provider, MD  Calcium Carbonate-Vitamin D (CALCIUM 600 + D PO) Take 2 tablets by mouth daily.      Historical Provider, MD  Cholecalciferol (VITAMIN D) 2000 units tablet Take 2,000 Units by mouth daily.    Historical Provider, MD  esomeprazole (NEXIUM) 20 MG capsule Take 20 mg by mouth daily as needed (For heartburn.).     Historical Provider, MD  levothyroxine (SYNTHROID, LEVOTHROID) 50 MCG tablet Take 50 mcg by mouth daily. 12/29/15   Historical Provider, MD  Multiple Vitamins-Minerals (CENTRUM SILVER PO) Take 1 tablet by mouth  daily.     Historical Provider, MD  tamoxifen (NOLVADEX) 20 MG tablet Take 1 tablet (20 mg total) by mouth daily. 04/25/15   Nicholas Lose, MD  venlafaxine XR (EFFEXOR-XR) 75 MG 24 hr capsule Take 75 mg by mouth daily. 12/29/15   Historical Provider, MD    Physical Exam: Vitals:   03/29/16 1559  BP: 121/61  Pulse: 91  Resp: 20  Temp: 99.3 F (37.4 C)  TempSrc: Oral      Constitutional: NAD, calm, comfortable but endorsing significant pain under left breast/left chest wall with movement Eyes: PERRL, lids and conjunctivae normal ENMT: Mucous membranes are moist. Posterior pharynx clear of any exudate or lesions.Normal dentition.  Neck: normal, supple, no masses, no thyromegaly Respiratory: clear to auscultation bilaterally, no wheezing, no crackles. Normal respiratory effort. No accessory muscle use.  Cardiovascular: Regular rate and rhythm, no murmurs / rubs / gallops. No extremity edema. 2+ pedal pulses. No carotid bruits.  Abdomen: no tenderness, no masses palpated. No hepatosplenomegaly. Bowel sounds positive.  Musculoskeletal: no clubbing / cyanosis. No joint deformity upper  and lower extremities. Good ROM, no contractures. Normal muscle tone.  Skin: Patient has induration and erythema involving the entire anterior left chest involving the breast extending downwards towards the upper abdomen and down underneath the left breast radiating size extending towards the back (see pictures below)-patient has exquisite pain disproportionate to clinical findings with only minimal palpation over the area under the left lateral breast region-no obvious or appreciable fluid collections and no vesicular formations Neurologic: CN 2-12 grossly intact. Sensation intact, DTR normal. Strength 5/5 x all 4 extremities.  Psychiatric: Normal judgment and insight. Alert and oriented x 3. Normal mood.    Picture of left chest wall/breast:   Picture Left flank:   Picture faint cellulitic changes upper abdominal wall:    Labs on Admission: I have personally reviewed following labs and imaging studies  CBC:  Recent Labs Lab 03/29/16 1649  WBC 12.3*  NEUTROABS 8.2*  HGB 11.4*  HCT 34.4*  MCV 93.2  PLT 397   Basic Metabolic Panel: No results for input(s): NA, K, CL, CO2, GLUCOSE, BUN, CREATININE, CALCIUM, MG, PHOS in the last 168 hours. GFR: CrCl cannot be calculated (Unknown ideal weight.). Liver Function Tests: No results for input(s): AST, ALT, ALKPHOS, BILITOT, PROT, ALBUMIN in the last 168 hours. No results for input(s): LIPASE, AMYLASE in the last 168 hours. No results for input(s): AMMONIA in the last 168 hours. Coagulation Profile: No results for input(s): INR, PROTIME in the last 168 hours. Cardiac Enzymes: No results for input(s): CKTOTAL, CKMB, CKMBINDEX, TROPONINI in the last 168 hours. BNP (last 3 results) No results for input(s): PROBNP in the last 8760 hours. HbA1C: No results for input(s): HGBA1C in the last 72 hours. CBG: No results for input(s): GLUCAP in the last 168 hours. Lipid Profile: No results for input(s): CHOL, HDL, LDLCALC, TRIG, CHOLHDL,  LDLDIRECT in the last 72 hours. Thyroid Function Tests: No results for input(s): TSH, T4TOTAL, FREET4, T3FREE, THYROIDAB in the last 72 hours. Anemia Panel: No results for input(s): VITAMINB12, FOLATE, FERRITIN, TIBC, IRON, RETICCTPCT in the last 72 hours. Urine analysis:    Component Value Date/Time   COLORURINE AMBER (A) 03/02/2016 0507   APPEARANCEUR CLOUDY (A) 03/02/2016 0507   LABSPEC 1.025 03/02/2016 0507   PHURINE 5.5 03/02/2016 0507   GLUCOSEU NEGATIVE 03/02/2016 0507   HGBUR LARGE (A) 03/02/2016 0507   BILIRUBINUR NEGATIVE 03/02/2016 0507   KETONESUR 15 (A) 03/02/2016 0507  PROTEINUR NEGATIVE 03/02/2016 0507   UROBILINOGEN 0.2 09/14/2011 1043   NITRITE NEGATIVE 03/02/2016 0507   LEUKOCYTESUR TRACE (A) 03/02/2016 0507   Sepsis Labs: '@LABRCNTIP' (procalcitonin:4,lacticidven:4) )No results found for this or any previous visit (from the past 240 hour(s)).   Radiological Exams on Admission: No results found.    Assessment/Plan Principal Problem:   Cellulitis of chest wall/History of Breast cancer of upper-outer quadrant of left female breast s/p breast reconstruction -Patient presents with rapidly evolving saline skin changes to the left chest wall/breast with pain disproportionate to clinical findings concerning for a necrotizing soft tissue infection versus myositis -Discussed with radiologist on call Dr. Kris Hartmann who recommended CT of the chest with contrast initially to rule out fluid collections and degree of cellulitis-could proceed later with MRI if findings on CT equivocal  -Check  lactic acid 1, CK and ESR -Empiric IV vancomycin and Zosyn with pharmacy managing the dosing -Stat CBC, PT, PTT, INR, CMET, and blood cultures -Normal saline IVFs -IV morphine as well as Tylenol and oxycodone for pain -Hold preadmission tamoxifen  Active Problems:   History of Pancreatitis (July 2017) -Currently asymptomatic -Follow-up with GI as previously scheduled    HTN -Current  blood pressure controlled -Hold and reevaluate blood pressure re evaluate in am -Was on Norvasc prior to admission and this was a new medication after previous admission when hydrochlorothiazide was discontinued and suspect combination of discontinuation of diuretic as well as Norvasc and tripping to patient's lower extremity edema reports     Hypothyroidism -Continue preadmission Synthroid    GERD (gastroesophageal reflux disease)  -continue preadmission PPI    Anxiety disorder/Depression -Continue preadmission Effexor      DVT prophylaxis:  Lovenox  Code Status: Full   Family Communication:  family friend at bedside with patient's permission  Disposition Plan: Anticipate discharge back to preadmission home environment pending medical stability and workup  Consults called:  none  Admission status: Inpatient/surgical floor     Winford Hehn L. ANP-BC [facility] Pager [phone]   If 7PM-7AM, please contact night-coverage [email or web address] Password [email or web address]  03/29/2016, 5:22 PM

## 2016-03-29 NOTE — Progress Notes (Signed)
Notified MD of patients  arrival to  Floor room 5N31, oriented to room and safety  equipment

## 2016-03-29 NOTE — Progress Notes (Signed)
PHARMACIST - PHYSICIAN ORDER COMMUNICATION  CONCERNING: P&T Medication Policy on Herbal Medications  DESCRIPTION:  This patient's order for:  Biotin  has been noted.  This product(s) is classified as an "herbal" or natural product. Due to a lack of definitive safety studies or FDA approval, nonstandard manufacturing practices, plus the potential risk of unknown drug-drug interactions while on inpatient medications, the Pharmacy and Therapeutics Committee does not permit the use of "herbal" or natural products of this type within [facility].   ACTION TAKEN: The pharmacy department is unable to verify this order at this time and your patient has been informed of this safety policy. Please reevaluate patient's clinical condition at discharge and address if the herbal or natural product(s) should be resumed at that time.  Sherlon Handing, PharmD, BCPS Clinical pharmacist, pager [phone] 03/29/2016 10:09 PM

## 2016-03-30 DIAGNOSIS — K219 Gastro-esophageal reflux disease without esophagitis: Secondary | ICD-10-CM

## 2016-03-30 DIAGNOSIS — F419 Anxiety disorder, unspecified: Secondary | ICD-10-CM

## 2016-03-30 DIAGNOSIS — I10 Essential (primary) hypertension: Secondary | ICD-10-CM

## 2016-03-30 DIAGNOSIS — F329 Major depressive disorder, single episode, unspecified: Secondary | ICD-10-CM

## 2016-03-30 DIAGNOSIS — E039 Hypothyroidism, unspecified: Secondary | ICD-10-CM

## 2016-03-30 LAB — CBC
HCT: 31.8 % — ABNORMAL LOW (ref 36.0–46.0)
Hemoglobin: 10.7 g/dL — ABNORMAL LOW (ref 12.0–15.0)
MCH: 31.5 pg (ref 26.0–34.0)
MCHC: 33.6 g/dL (ref 30.0–36.0)
MCV: 93.5 fL (ref 78.0–100.0)
PLATELETS: 203 10*3/uL (ref 150–400)
RBC: 3.4 MIL/uL — AB (ref 3.87–5.11)
RDW: 13.7 % (ref 11.5–15.5)
WBC: 10.2 10*3/uL (ref 4.0–10.5)

## 2016-03-30 LAB — COMPREHENSIVE METABOLIC PANEL
ALBUMIN: 3 g/dL — AB (ref 3.5–5.0)
ALT: 51 U/L (ref 14–54)
ANION GAP: 9 (ref 5–15)
AST: 62 U/L — ABNORMAL HIGH (ref 15–41)
Alkaline Phosphatase: 63 U/L (ref 38–126)
BILIRUBIN TOTAL: 0.8 mg/dL (ref 0.3–1.2)
BUN: 8 mg/dL (ref 6–20)
CO2: 26 mmol/L (ref 22–32)
Calcium: 8.5 mg/dL — ABNORMAL LOW (ref 8.9–10.3)
Chloride: 107 mmol/L (ref 101–111)
Creatinine, Ser: 0.94 mg/dL (ref 0.44–1.00)
GFR calc Af Amer: >60 mL/min (ref >60)
GFR calc non Af Amer: >60 mL/min (ref >60)
GLUCOSE: 113 mg/dL — AB (ref 65–99)
POTASSIUM: 3.4 mmol/L — AB (ref 3.5–5.1)
SODIUM: 142 mmol/L (ref 135–145)
TOTAL PROTEIN: 5.4 g/dL — AB (ref 6.5–8.1)

## 2016-03-30 MED ORDER — ONDANSETRON HCL 4 MG PO TABS
4.0000 mg | ORAL_TABLET | Freq: Three times a day (TID) | ORAL | Status: DC | PRN
  Administered 2016-03-30: 4 mg via ORAL
  Filled 2016-03-30: qty 1

## 2016-03-30 MED ORDER — POTASSIUM CHLORIDE CRYS ER 20 MEQ PO TBCR
40.0000 meq | EXTENDED_RELEASE_TABLET | Freq: Once | ORAL | Status: AC
  Administered 2016-03-30: 40 meq via ORAL
  Filled 2016-03-30: qty 2

## 2016-03-30 NOTE — Progress Notes (Signed)
Subjective: Continues to have pain left lateral chest more than anterior chest where she has no pain  Objective: Vital signs in last 24 hours: Temp:  [98.1 F (36.7 C)-99.5 F (37.5 C)] 98.1 F (36.7 C) (07/28 0622) Pulse Rate:  [63-91] 63 (07/28 0622) Resp:  [16-20] 16 (07/28 0622) BP: (112-121)/(55-61) 116/59 (07/28 0622) SpO2:  [99 %] 99 % (07/28 0622)  Intake/Output from previous day: 07/27 0701 - 07/28 0700 In: 1191.3 [P.O.:150; I.V.:991.3; IV Piggyback:50] Out: -  Intake/Output this shift: No intake/output data recorded.  Operative sites: Erythema is about the same. There is a little bit of new erythema on the upper abdomen.   Recent Labs  03/29/16 1649 03/30/16 0347  WBC 12.3* 10.2  HGB 11.4* 10.7*  HCT 34.4* 31.8*  NA 140 142  K 4.0 3.4*  CL 108 107  CO2 24 26  BUN 9 8  CREATININE 0.74 0.94    Studies/Results: Ct Chest W Contrast  Result Date: 03/29/2016 CLINICAL DATA:  Evaluate cellulitis of the chest wall. EXAM: CT CHEST WITH CONTRAST TECHNIQUE: Multidetector CT imaging of the chest was performed during intravenous contrast administration. CONTRAST:  75 ISOVUE-300 IOPAMIDOL (ISOVUE-300) INJECTION 61% COMPARISON:  Chest CT- 10/19/2011; chest radiograph - 03/02/2016 FINDINGS: Cardiovascular: Normal heart size.  No pericardial effusion. Although this examination was not tailored for the evaluation the pulmonary arteries, there are no discrete filling defects within the central pulmonary arterial tree to suggest central pulmonary embolism. Normal caliber of the main pulmonary artery. Scattered minimal amount of atherosclerotic plaque within the thoracic aorta. No definite thoracic aortic dissection is nongated examination. Conventional configuration of the aortic arch. Branch vessel of the apparent appear widely patent throughout their imaged course. Mediastinum/Nodes: No bulky mediastinal, hilar axillary lymphadenopathy. Lungs/Pleura: Mild apical predominant  centrilobular emphysematous change. No focal airspace opacities. No pleural effusion or pneumothorax. The center pulmonary airways appear widely patent. No discrete pulmonary nodules. Upper Abdomen: Limited early arterial phase evaluation of the upper abdomen demonstrates enlargement of the common bile duct measuring approximately 0.9 cm in diameter with associated mild centralized intrahepatic biliary duct dilatation, likely the sequela of post cholecystectomy state. Incidental note is made of a small splenule. Suspected small hiatal hernia. Musculoskeletal: No acute or aggressive osseous abnormalities. Stigmata of DISH within the thoracic spine. Post left breast augmentation. There is marked asymmetric skin thickening about the anterior aspect of the left breast breast prosthesis measuring approximately 1.1 cm in diameter (representative images 59 in 73, series 3). No subcutaneous emphysema. No definable/drainable fluid collection, though note, the peripheral lateral aspect of the prosthesis was not imaged. Normal appearance of the thyroid gland. IMPRESSION: 1. No acute cardiopulmonary disease. 2. Post left breast augmentation with marked asymmetric skin thickening about the anterior aspect of the breast prosthesis, likely compatible with provided history of cellulitis. No subcutaneous emphysema or definable / drainable fluid collection, though note, the peripheral / lateral aspect of the prosthesis was not imaged. 3.  Aortic Atherosclerosis (ICD10-170.0) 4.  Emphysema. [id number]) Electronically Signed   By: Sandi Mariscal M.D.   On: 03/29/2016 21:17   Assessment/Plan: CT results noted. No evidence of fluid collection around implant. The irregularity of soft tissue thickness over the implant is the nature of nipple sparing mastectomy (the dissection does not leave perfectly equal skin flap in all directions) and the combination of muscle (thicker) and acellular dermal matrix (thinner) for coverage of the  implant.  Could this be shingles?  Will continue to follow. I am reluctant to  perform surgical exploration of the reconstruction at present.  LOS: 1 day    [name] M 03/30/2016 8:05 AM

## 2016-03-30 NOTE — Progress Notes (Signed)
PROGRESS NOTE  [name]  BRA:[id number] DOB: 27-Mar-1951  DOA: 03/29/2016 PCP: [name], MD   Brief Narrative:  65 year old female with PMH of left breast cancer status post reconstructive surgery August 2016, subsequent treatment for flap infection September 2016, HTN, anxiety, depression, GERD, recent hospitalization earlier this month for idiopathic pancreatitis felt to be related to HCTZ-discontinued and has follow-up with [name], [facility] on 04/25/16 at 10 AM to discuss possible EUS, presented directly from Dr. Alvira Philips (plastic surgery) office with fairly acute onset of small red spot with associated pain underneath the left breast/upper chest wall which rapidly progressed involving significant area of the left anterior chest wall, lateral chest and back. Admitted for presumed cellulitis. CT chest negative for collections. Infectious disease consulted 7/28.   Assessment & Plan:   Principal Problem:   Cellulitis of chest wall Active Problems:   GERD (gastroesophageal reflux disease)   Anxiety disorder   History of Breast cancer of upper-outer quadrant of left female breast s/p breast reconstruction   History of Pancreatitis (July 2017)   Depression   Hypothyroidism   HTN (hypertension)   Cellulitis of left chest wall (anterior, lateral and posterior) - in patient with breast cancer reconstructive surgery. - CT chest without subcutaneous emphysema or drainable fluid. - Blood cultures 2: Pending. Lactate normal. CK: 31 - Empirically started on IV vancomycin and Zosyn on 7/27. ESR 5. - As per patient and exam, no significant improvement and may have slightly spread to skin of left upper quadrant. - Dr. Harlow Mares, plastic surgery consultation appreciated: Asking question if this could be shingles and indicates reluctance to perform surgical exploration of the reconstruction at present. Clinically does not appear like shingles but have requested infectious disease  consultation.  Anemia - Stable. Follow CBCs.  Hypokalemia - Replace and follow.  History of pancreatitis, July 2017 - Resolved. Outpatient follow-up with [name] on 8/23 for further workup.  Essential hypertension - Controlled off of antihypertensives: Norvasc held on admission. HCTZ discontinued prior admission due to pancreatitis.  Hypothyroid - Continue Synthroid  GERD - Continue PPI  Anxiety and depression - Continue Effexor   DVT prophylaxis: Lovenox Code Status: Full Family Communication: Discussed in detail with the patient. No family at bedside. Disposition Plan: DC home when clinically improved.   Consultants:   Plastic Surgery/Dr. Harlow Mares.  Infectious Disease  Procedures:   None  Antimicrobials:   IV Vancomycin 7/27>  IV Zosyn 7/28>    Subjective: States that left breast area does not feel warm. Has not complained of significant pain since the episode began. Feels that the redness has not changed and spread somewhat to left upper quadrant of abdomen. Subsequently reported nausea to RN.   Objective:  Vitals:   03/29/16 1559 03/29/16 2007 03/30/16 0622  BP: 121/61 (!) 112/55 (!) 116/59  Pulse: 91 84 63  Resp: '20 16 16  ' Temp: 99.3 F (37.4 C) 99.5 F (37.5 C) 98.1 F (36.7 C)  TempSrc: Oral Oral Oral  SpO2:  99% 99%    Intake/Output Summary (Last 24 hours) at 03/30/16 1059 Last data filed at 03/30/16 8811  Gross per 24 hour  Intake          1191.25 ml  Output                0 ml  Net          1191.25 ml   There were no vitals filed for this visit.  Examination: Patient was  examined with a female RN-chaperone in the room.  General exam: Pleasant middle-aged female sitting up comfortably in the bed. Does not appear septic or toxic. Respiratory system: Clear to auscultation. Respiratory effort normal. Cardiovascular system: S1 & S2 heard, RRR. No JVD, murmurs, rubs, gallops or clicks. No pedal edema. Gastrointestinal system: Abdomen  is nondistended, soft and nontender. No organomegaly or masses felt. Normal bowel sounds heard. Central nervous system: Alert and oriented. No focal neurological deficits. Extremities: Symmetric 5 x 5 power. Skin: significant patchy erythema, induration without tenderness or significant warmth over reconstructed left breast/anterior, lateral and posterior chest wall. More prominent anteriorly. This area is demarcated by pen mark. Please see pictures in H&P for initial presentation in ED. Small patchy area of erythema over left upper quadrant abdomen. No vesicles. Psychiatry: Judgement and insight appear normal. Mood & affect appropriate.    Data Reviewed: I have personally reviewed following labs and imaging studies  CBC:  Recent Labs Lab 03/29/16 1649 03/30/16 0347  WBC 12.3* 10.2  NEUTROABS 8.2*  --   HGB 11.4* 10.7*  HCT 34.4* 31.8*  MCV 93.2 93.5  PLT 215 646   Basic Metabolic Panel:  Recent Labs Lab 03/29/16 1649 03/29/16 1749 03/30/16 0347  NA 140  --  142  K 4.0  --  3.4*  CL 108  --  107  CO2 24  --  26  GLUCOSE 101*  --  113*  BUN 9  --  8  CREATININE 0.74  --  0.94  CALCIUM 8.6*  --  8.5*  MG  --  1.8  --   PHOS  --  3.3  --    GFR: CrCl cannot be calculated (Unknown ideal weight.). Liver Function Tests:  Recent Labs Lab 03/29/16 1649 03/30/16 0347  AST 32 62*  ALT 35 51  ALKPHOS 56 63  BILITOT 0.8 0.8  PROT 5.6* 5.4*  ALBUMIN 3.3* 3.0*   No results for input(s): LIPASE, AMYLASE in the last 168 hours. No results for input(s): AMMONIA in the last 168 hours. Coagulation Profile:  Recent Labs Lab 03/29/16 1749  INR 1.00   Cardiac Enzymes:  Recent Labs Lab 03/29/16 1649  CKTOTAL 31*   BNP (last 3 results) No results for input(s): PROBNP in the last 8760 hours. HbA1C: No results for input(s): HGBA1C in the last 72 hours. CBG: No results for input(s): GLUCAP in the last 168 hours. Lipid Profile: No results for input(s): CHOL, HDL,  LDLCALC, TRIG, CHOLHDL, LDLDIRECT in the last 72 hours. Thyroid Function Tests: No results for input(s): TSH, T4TOTAL, FREET4, T3FREE, THYROIDAB in the last 72 hours. Anemia Panel: No results for input(s): VITAMINB12, FOLATE, FERRITIN, TIBC, IRON, RETICCTPCT in the last 72 hours.  Sepsis Labs:  Recent Labs Lab 03/29/16 1749  LATICACIDVEN 1.5    Recent Results (from the past 240 hour(s))  Culture, blood (Routine X 2) w Reflex to ID Panel     Status: None (Preliminary result)   Collection Time: 03/29/16  4:58 PM  Result Value Ref Range Status   Specimen Description BLOOD RIGHT ANTECUBITAL  Final   Special Requests IN PEDIATRIC BOTTLE 2CC  Final   Culture PENDING  Incomplete   Report Status PENDING  Incomplete  Culture, blood (Routine X 2) w Reflex to ID Panel     Status: None (Preliminary result)   Collection Time: 03/29/16  5:03 PM  Result Value Ref Range Status   Specimen Description BLOOD RIGHT ANTECUBITAL  Final   Special Requests  IN PEDIATRIC BOTTLE 3CC  Final   Culture PENDING  Incomplete   Report Status PENDING  Incomplete         Radiology Studies: Ct Chest W Contrast  Result Date: 03/29/2016 CLINICAL DATA:  Evaluate cellulitis of the chest wall. EXAM: CT CHEST WITH CONTRAST TECHNIQUE: Multidetector CT imaging of the chest was performed during intravenous contrast administration. CONTRAST:  75 ISOVUE-300 IOPAMIDOL (ISOVUE-300) INJECTION 61% COMPARISON:  Chest CT- 10/19/2011; chest radiograph - 03/02/2016 FINDINGS: Cardiovascular: Normal heart size.  No pericardial effusion. Although this examination was not tailored for the evaluation the pulmonary arteries, there are no discrete filling defects within the central pulmonary arterial tree to suggest central pulmonary embolism. Normal caliber of the main pulmonary artery. Scattered minimal amount of atherosclerotic plaque within the thoracic aorta. No definite thoracic aortic dissection is nongated examination. Conventional  configuration of the aortic arch. Branch vessel of the apparent appear widely patent throughout their imaged course. Mediastinum/Nodes: No bulky mediastinal, hilar axillary lymphadenopathy. Lungs/Pleura: Mild apical predominant centrilobular emphysematous change. No focal airspace opacities. No pleural effusion or pneumothorax. The center pulmonary airways appear widely patent. No discrete pulmonary nodules. Upper Abdomen: Limited early arterial phase evaluation of the upper abdomen demonstrates enlargement of the common bile duct measuring approximately 0.9 cm in diameter with associated mild centralized intrahepatic biliary duct dilatation, likely the sequela of post cholecystectomy state. Incidental note is made of a small splenule. Suspected small hiatal hernia. Musculoskeletal: No acute or aggressive osseous abnormalities. Stigmata of DISH within the thoracic spine. Post left breast augmentation. There is marked asymmetric skin thickening about the anterior aspect of the left breast breast prosthesis measuring approximately 1.1 cm in diameter (representative images 59 in 73, series 3). No subcutaneous emphysema. No definable/drainable fluid collection, though note, the peripheral lateral aspect of the prosthesis was not imaged. Normal appearance of the thyroid gland. IMPRESSION: 1. No acute cardiopulmonary disease. 2. Post left breast augmentation with marked asymmetric skin thickening about the anterior aspect of the breast prosthesis, likely compatible with provided history of cellulitis. No subcutaneous emphysema or definable / drainable fluid collection, though note, the peripheral / lateral aspect of the prosthesis was not imaged. 3.  Aortic Atherosclerosis (ICD10-170.0) 4.  Emphysema. (MBP11-E16.9) Electronically Signed   By: Sandi Mariscal M.D.   On: 03/29/2016 21:17       Scheduled Meds: . aspirin  81 mg Oral Daily  . cholecalciferol  2,000 Units Oral Daily  . enoxaparin (LOVENOX) injection  40  mg Subcutaneous Q24H  . levothyroxine  50 mcg Oral QAC breakfast  . pantoprazole  40 mg Oral Daily  . piperacillin-tazobactam (ZOSYN)  IV  3.375 g Intravenous Q8H  . vancomycin  1,000 mg Intravenous Q12H  . venlafaxine XR  75 mg Oral Daily   Continuous Infusions: . sodium chloride 75 mL/hr at 03/29/16 1852     LOS: 1 day    Time spent: 45 minutes.    [facility], MD [facility] Pager [phone]  If 7PM-7AM, please contact night-coverage [email or web address] Password [facility] 03/30/2016, 10:59 AM

## 2016-03-30 NOTE — Care Management Important Message (Signed)
Important Message  Patient Details  Name: [name] MRN: [id number] Date of Birth: 04-May-1951   Medicare Important Message Given:  Yes    Loann Quill 03/30/2016, 10:48 AM

## 2016-03-31 DIAGNOSIS — L03313 Cellulitis of chest wall: Principal | ICD-10-CM

## 2016-03-31 LAB — CBC
HEMATOCRIT: 35.1 % — AB (ref 36.0–46.0)
HEMOGLOBIN: 11.1 g/dL — AB (ref 12.0–15.0)
MCH: 30.3 pg (ref 26.0–34.0)
MCHC: 31.6 g/dL (ref 30.0–36.0)
MCV: 95.9 fL (ref 78.0–100.0)
Platelets: 202 10*3/uL (ref 150–400)
RBC: 3.66 MIL/uL — AB (ref 3.87–5.11)
RDW: 13.5 % (ref 11.5–15.5)
WBC: 7 10*3/uL (ref 4.0–10.5)

## 2016-03-31 LAB — BASIC METABOLIC PANEL
ANION GAP: 5 (ref 5–15)
BUN: 6 mg/dL (ref 6–20)
CALCIUM: 8.7 mg/dL — AB (ref 8.9–10.3)
CHLORIDE: 112 mmol/L — AB (ref 101–111)
CO2: 24 mmol/L (ref 22–32)
Creatinine, Ser: 0.9 mg/dL (ref 0.44–1.00)
GFR calc non Af Amer: >60 mL/min (ref >60)
Glucose, Bld: 118 mg/dL — ABNORMAL HIGH (ref 65–99)
Potassium: 4 mmol/L (ref 3.5–5.1)
Sodium: 141 mmol/L (ref 135–145)

## 2016-03-31 LAB — TSH: TSH: 1.631 u[IU]/mL (ref 0.350–4.500)

## 2016-03-31 MED ORDER — CEFAZOLIN IN D5W 1 GM/50ML IV SOLN
1.0000 g | Freq: Three times a day (TID) | INTRAVENOUS | Status: DC
  Administered 2016-03-31 – 2016-04-01 (×3): 1 g via INTRAVENOUS
  Filled 2016-03-31 (×4): qty 50

## 2016-03-31 MED ORDER — VALACYCLOVIR HCL 500 MG PO TABS
1000.0000 mg | ORAL_TABLET | Freq: Three times a day (TID) | ORAL | Status: DC
  Administered 2016-03-31 – 2016-04-01 (×3): 1000 mg via ORAL
  Filled 2016-03-31 (×3): qty 2

## 2016-03-31 NOTE — Consult Note (Signed)
[facility]  Total days of antibiotics 3        Day 3 vanco        Day 3 piptazo               Reason for Consult: cellulitis   Referring Physician: [email or web address]  Principal Problem:   Cellulitis of chest wall Active Problems:   GERD (gastroesophageal reflux disease)   Anxiety disorder   History of Breast cancer of upper-outer quadrant of left female breast s/p breast reconstruction   History of Pancreatitis (July 2017)   Depression   Hypothyroidism   HTN (hypertension)    HPI: [name] is a 65 y.o. female with past medical history of breast cancer of upper-outer quandrant of left breast Stage ), Tis(DCIS), N0,cM0, ER+/PR- in March 2016 status post left mastectomy and  reconstructive surgery August 2016 with subsequent treatment for flap infection September 2016, now on tamoxifen. She also has a history of HTN, anxiety disorder, GERD and depression. Of late, She was hospitalized idiopathic pancreatitis from 6/30-7/4. She was admitted on 7/27 from her plastic surgeon's office for left chest wall cellulitis which started underneath her left breast/left upper lateral chest wall that started initially as small area that had rapid extention to involve  the left breast and most of the left anterior chest wall and the area was exquisitely tender to touch. She had associated  low-grade fevers. She denied N/V/Diarrhea. She was empirically started on vanco and piptazo, which has shown some improvement per dr. Algis Liming since initation of abtx 48hr ago. She states that she had her shingles vaccines, no hx of shingles in the past  Past Medical History:  Diagnosis Date  . Acute pancreatitis 02/2016  . Anxiety    hx  . Cellulitis of chest wall 03/29/2016   "left breast, arm, back"  . Chronic kidney disease    released from [facility] feb 16 states blood found in urine in past-tests done but could not find reason for blood  . Depression    hx  . Ductal carcinoma of  left breast ([place]) 2016   Ductal carcinoma in situ (DCIS) of left breast  . GERD (gastroesophageal reflux disease)   . Heart murmur    "when I was a kid"  . Hypertension   . Hypothyroidism   . PONV (postoperative nausea and vomiting)    with cataract surgery and ist lumpectomy req patch and iv med for nausea    Allergies:  Allergies  Allergen Reactions  . Other Itching and Rash    Patient states that she was unable to use supportive wrap they provided at [facility] following a breast removal and reconstruction.     MEDICATIONS: . aspirin  81 mg Oral Daily  .  ceFAZolin (ANCEF) IV  1 g Intravenous Q8H  . cholecalciferol  2,000 Units Oral Daily  . enoxaparin (LOVENOX) injection  40 mg Subcutaneous Q24H  . levothyroxine  50 mcg Oral QAC breakfast  . pantoprazole  40 mg Oral Daily  . venlafaxine XR  75 mg Oral Daily    Social History  Substance Use Topics  . Smoking status: Former Smoker    Packs/day: 0.50    Years: 46.00    Types: Cigarettes    Quit date: 11/25/2014  . Smokeless tobacco: Never Used  . Alcohol use Yes     Comment: 03/29/2016 "might drink 4 drinks/year, if that"    Family History  Problem Relation Age  of Onset  . Heart disease Mother   . Hypertension Mother   . Leukemia Father   . Heart disease Father   . Cancer Brother   . Breast cancer Maternal Aunt   . Colon cancer      Father's aunt   Review of Systems  Constitutional: Negative for fever, chills, diaphoresis, activity change, appetite change, fatigue and unexpected weight change.  HENT: Negative for congestion, sore throat, rhinorrhea, sneezing, trouble swallowing and sinus pressure.  Eyes: Negative for photophobia and visual disturbance.  Respiratory: Negative for cough, chest tightness, shortness of breath, wheezing and stridor.  Cardiovascular: Negative for chest pain, palpitations and leg swelling.  Gastrointestinal: Negative for nausea, vomiting, abdominal pain, diarrhea, constipation, blood  in stool, abdominal distention and anal bleeding.  Genitourinary: Negative for dysuria, hematuria, flank pain and difficulty urinating.  Musculoskeletal: Negative for myalgias, back pain, joint swelling, arthralgias and gait problem.  Skin: red, painful rash to left breast, left back Neurological: Negative for dizziness, tremors, weakness and light-headedness.  Hematological: Negative for adenopathy. Does not bruise/bleed easily.  Psychiatric/Behavioral: Negative for behavioral problems, confusion, sleep disturbance, dysphoric mood, decreased concentration and agitation.     OBJECTIVE: Temp:  [98.1 F (36.7 C)-98.3 F (36.8 C)] 98.3 F (36.8 C) (07/29 0700) Pulse Rate:  [53-70] 53 (07/29 0700) Resp:  [17-18] 17 (07/28 2100) BP: (105-119)/(39-48) 113/47 (07/29 0700) SpO2:  [98 %-100 %] 100 % (07/29 0700) Weight:  [91.6 kg (202 lb)] 91.6 kg (202 lb) (07/29 0900) Physical Exam  Constitutional:  oriented to person, place, and time. appears well-developed and well-nourished. No distress.  HENT: [place]/AT, PERRLA, no scleral icterus Mouth/Throat: Oropharynx is clear and moist. No oropharyngeal exudate.  Cardiovascular: Normal rate, regular rhythm and normal heart sounds. Exam reveals no gallop and no friction rub.  No murmur heard.  Pulmonary/Chest: Effort normal and breath sounds normal. No respiratory distress.  has no wheezes. Chest = surgical scar from breast reconstruction. Erythema beneath left breast extending to back in  T6-T8 region. Indurated. Areas improving within drawn margin. Small cluster of redness anterior abdominal wall T8 region Neck = supple, no nuchal rigidity Abdominal: Soft. Bowel sounds are normal.  exhibits no distension. There is no tenderness.  Lymphadenopathy: no cervical adenopathy. No axillary adenopathy Neurological: alert and oriented to person, place, and time.  Skin: Skin is warm and dry. No rash noted. No erythema. See chest wall comments Psychiatric: a  normal mood and affect.  behavior is normal.    LABS: Results for orders placed or performed during the hospital encounter of 03/29/16 (from the past 48 hour(s))  CBC with Differential/Platelet     Status: Abnormal   Collection Time: 03/29/16  4:49 PM  Result Value Ref Range   WBC 12.3 (H) 4.0 - 10.5 K/uL   RBC 3.69 (L) 3.87 - 5.11 MIL/uL   Hemoglobin 11.4 (L) 12.0 - 15.0 g/dL   HCT 34.4 (L) 36.0 - 46.0 %   MCV 93.2 78.0 - 100.0 fL   MCH 30.9 26.0 - 34.0 pg   MCHC 33.1 30.0 - 36.0 g/dL   RDW 13.4 11.5 - 15.5 %   Platelets 215 150 - 400 K/uL   Neutrophils Relative % 66 %   Neutro Abs 8.2 (H) 1.7 - 7.7 K/uL   Lymphocytes Relative 24 %   Lymphs Abs 2.9 0.7 - 4.0 K/uL   Monocytes Relative 7 %   Monocytes Absolute 0.8 0.1 - 1.0 K/uL   Eosinophils Relative 3 %   Eosinophils  Absolute 0.4 0.0 - 0.7 K/uL   Basophils Relative 0 %   Basophils Absolute 0.0 0.0 - 0.1 K/uL  Comprehensive metabolic panel     Status: Abnormal   Collection Time: 03/29/16  4:49 PM  Result Value Ref Range   Sodium 140 135 - 145 mmol/L   Potassium 4.0 3.5 - 5.1 mmol/L   Chloride 108 101 - 111 mmol/L   CO2 24 22 - 32 mmol/L   Glucose, Bld 101 (H) 65 - 99 mg/dL   BUN 9 6 - 20 mg/dL   Creatinine, Ser 0.74 0.44 - 1.00 mg/dL   Calcium 8.6 (L) 8.9 - 10.3 mg/dL   Total Protein 5.6 (L) 6.5 - 8.1 g/dL   Albumin 3.3 (L) 3.5 - 5.0 g/dL   AST 32 15 - 41 U/L   ALT 35 14 - 54 U/L   Alkaline Phosphatase 56 38 - 126 U/L   Total Bilirubin 0.8 0.3 - 1.2 mg/dL   GFR calc non Af Amer >60 >60 mL/min   GFR calc Af Amer >60 >60 mL/min    Comment: (NOTE) The eGFR has been calculated using the CKD EPI equation. This calculation has not been validated in all clinical situations. eGFR's persistently <60 mL/min signify possible Chronic Kidney Disease.    Anion gap 8 5 - 15  Sedimentation rate     Status: None   Collection Time: 03/29/16  4:49 PM  Result Value Ref Range   Sed Rate 5 0 - 22 mm/hr  CK     Status: Abnormal    Collection Time: 03/29/16  4:49 PM  Result Value Ref Range   Total CK 31 (L) 38 - 234 U/L  Culture, blood (Routine X 2) w Reflex to ID Panel     Status: None (Preliminary result)   Collection Time: 03/29/16  4:58 PM  Result Value Ref Range   Specimen Description BLOOD RIGHT ANTECUBITAL    Special Requests IN PEDIATRIC BOTTLE 2CC    Culture NO GROWTH 2 DAYS    Report Status PENDING   Culture, blood (Routine X 2) w Reflex to ID Panel     Status: None (Preliminary result)   Collection Time: 03/29/16  5:03 PM  Result Value Ref Range   Specimen Description BLOOD RIGHT ANTECUBITAL    Special Requests IN PEDIATRIC BOTTLE 3CC    Culture NO GROWTH 2 DAYS    Report Status PENDING   Magnesium     Status: None   Collection Time: 03/29/16  5:49 PM  Result Value Ref Range   Magnesium 1.8 1.7 - 2.4 mg/dL  Phosphorus     Status: None   Collection Time: 03/29/16  5:49 PM  Result Value Ref Range   Phosphorus 3.3 2.5 - 4.6 mg/dL  APTT     Status: None   Collection Time: 03/29/16  5:49 PM  Result Value Ref Range   aPTT 31 24 - 36 seconds  Protime-INR     Status: None   Collection Time: 03/29/16  5:49 PM  Result Value Ref Range   Prothrombin Time 13.2 11.4 - 15.2 seconds   INR 1.00   Lactic acid, plasma     Status: None   Collection Time: 03/29/16  5:49 PM  Result Value Ref Range   Lactic Acid, Venous 1.5 0.5 - 1.9 mmol/L  Comprehensive metabolic panel     Status: Abnormal   Collection Time: 03/30/16  3:47 AM  Result Value Ref Range   Sodium 142 135 -  145 mmol/L   Potassium 3.4 (L) 3.5 - 5.1 mmol/L   Chloride 107 101 - 111 mmol/L   CO2 26 22 - 32 mmol/L   Glucose, Bld 113 (H) 65 - 99 mg/dL   BUN 8 6 - 20 mg/dL   Creatinine, Ser 0.94 0.44 - 1.00 mg/dL   Calcium 8.5 (L) 8.9 - 10.3 mg/dL   Total Protein 5.4 (L) 6.5 - 8.1 g/dL   Albumin 3.0 (L) 3.5 - 5.0 g/dL   AST 62 (H) 15 - 41 U/L   ALT 51 14 - 54 U/L   Alkaline Phosphatase 63 38 - 126 U/L   Total Bilirubin 0.8 0.3 - 1.2 mg/dL   GFR  calc non Af Amer >60 >60 mL/min   GFR calc Af Amer >60 >60 mL/min    Comment: (NOTE) The eGFR has been calculated using the CKD EPI equation. This calculation has not been validated in all clinical situations. eGFR's persistently <60 mL/min signify possible Chronic Kidney Disease.    Anion gap 9 5 - 15  CBC     Status: Abnormal   Collection Time: 03/30/16  3:47 AM  Result Value Ref Range   WBC 10.2 4.0 - 10.5 K/uL   RBC 3.40 (L) 3.87 - 5.11 MIL/uL   Hemoglobin 10.7 (L) 12.0 - 15.0 g/dL   HCT 31.8 (L) 36.0 - 46.0 %   MCV 93.5 78.0 - 100.0 fL   MCH 31.5 26.0 - 34.0 pg   MCHC 33.6 30.0 - 36.0 g/dL   RDW 13.7 11.5 - 15.5 %   Platelets 203 150 - 400 K/uL  CBC     Status: Abnormal   Collection Time: 03/31/16  6:11 AM  Result Value Ref Range   WBC 7.0 4.0 - 10.5 K/uL   RBC 3.66 (L) 3.87 - 5.11 MIL/uL   Hemoglobin 11.1 (L) 12.0 - 15.0 g/dL   HCT 35.1 (L) 36.0 - 46.0 %   MCV 95.9 78.0 - 100.0 fL   MCH 30.3 26.0 - 34.0 pg   MCHC 31.6 30.0 - 36.0 g/dL   RDW 13.5 11.5 - 15.5 %   Platelets 202 150 - 400 K/uL  Basic metabolic panel     Status: Abnormal   Collection Time: 03/31/16  6:11 AM  Result Value Ref Range   Sodium 141 135 - 145 mmol/L   Potassium 4.0 3.5 - 5.1 mmol/L   Chloride 112 (H) 101 - 111 mmol/L   CO2 24 22 - 32 mmol/L   Glucose, Bld 118 (H) 65 - 99 mg/dL   BUN 6 6 - 20 mg/dL   Creatinine, Ser 0.90 0.44 - 1.00 mg/dL   Calcium 8.7 (L) 8.9 - 10.3 mg/dL   GFR calc non Af Amer >60 >60 mL/min   GFR calc Af Amer >60 >60 mL/min    Comment: (NOTE) The eGFR has been calculated using the CKD EPI equation. This calculation has not been validated in all clinical situations. eGFR's persistently <60 mL/min signify possible Chronic Kidney Disease.    Anion gap 5 5 - 15    MICRO: 7/27 blood cx x 2 NGTD at 36hr IMAGING: Ct Chest W Contrast  Result Date: 03/29/2016 CLINICAL DATA:  Evaluate cellulitis of the chest wall. EXAM: CT CHEST WITH CONTRAST TECHNIQUE: Multidetector CT  imaging of the chest was performed during intravenous contrast administration. CONTRAST:  75 ISOVUE-300 IOPAMIDOL (ISOVUE-300) INJECTION 61% COMPARISON:  Chest CT- 10/19/2011; chest radiograph - 03/02/2016 FINDINGS: Cardiovascular: Normal heart size.  No pericardial  effusion. Although this examination was not tailored for the evaluation the pulmonary arteries, there are no discrete filling defects within the central pulmonary arterial tree to suggest central pulmonary embolism. Normal caliber of the main pulmonary artery. Scattered minimal amount of atherosclerotic plaque within the thoracic aorta. No definite thoracic aortic dissection is nongated examination. Conventional configuration of the aortic arch. Branch vessel of the apparent appear widely patent throughout their imaged course. Mediastinum/Nodes: No bulky mediastinal, hilar axillary lymphadenopathy. Lungs/Pleura: Mild apical predominant centrilobular emphysematous change. No focal airspace opacities. No pleural effusion or pneumothorax. The center pulmonary airways appear widely patent. No discrete pulmonary nodules. Upper Abdomen: Limited early arterial phase evaluation of the upper abdomen demonstrates enlargement of the common bile duct measuring approximately 0.9 cm in diameter with associated mild centralized intrahepatic biliary duct dilatation, likely the sequela of post cholecystectomy state. Incidental note is made of a small splenule. Suspected small hiatal hernia. Musculoskeletal: No acute or aggressive osseous abnormalities. Stigmata of DISH within the thoracic spine. Post left breast augmentation. There is marked asymmetric skin thickening about the anterior aspect of the left breast breast prosthesis measuring approximately 1.1 cm in diameter (representative images 59 in 73, series 3). No subcutaneous emphysema. No definable/drainable fluid collection, though note, the peripheral lateral aspect of the prosthesis was not imaged. Normal  appearance of the thyroid gland. IMPRESSION: 1. No acute cardiopulmonary disease. 2. Post left breast augmentation with marked asymmetric skin thickening about the anterior aspect of the breast prosthesis, likely compatible with provided history of cellulitis. No subcutaneous emphysema or definable / drainable fluid collection, though note, the peripheral / lateral aspect of the prosthesis was not imaged. 3.  Aortic Atherosclerosis (ICD10-170.0) 4.  Emphysema. ([id number]) Electronically Signed   By: Sandi Mariscal M.D.   On: 03/29/2016 21:17  Assessment/Plan:  65yo F with hx of breast cancer s/p mastectomy with breast reconstruction now on tamoxifen admitted for cellulitis to left chest wall. Improving with IV abtx  - would d/c vancomycin and piptazo and change to cefazolin. Can switch to cephalexin 579m QID as outpatient to finish out 10 d course of treatment. Appears improving from admit and line delineation  - difficult to tell if has concominant shingles occurrence. No overt clusters in the 48hr though has dermatome presentation. Will start her on empiric treatment of valtrex 1gm TID x 7 days. Will place her on contact isolation since no open blisters, no evidence of disseminated disease  - health maintenance = will check hiv ab and hep c in morning labs  - history of breast cancer = continue with tamoxifen  - recent acute pancreatitis = appears resolved, no residual symptoms  Noell Lorensen B. S[facility]vancefor [facility]ious Diseases [phone]

## 2016-03-31 NOTE — Progress Notes (Signed)
Subjective: Feels better. Less pain.  Objective: Vital signs in last 24 hours: Temp:  [98.1 F (36.7 C)-98.3 F (36.8 C)] 98.3 F (36.8 C) (07/29 0700) Pulse Rate:  [53-70] 53 (07/29 0700) Resp:  [17-18] 17 (07/28 2100) BP: (105-119)/(39-48) 113/47 (07/29 0700) SpO2:  [98 %-100 %] 100 % (07/29 0700) Weight:  [202 lb 2.6 oz (91.7 kg)] 202 lb 2.6 oz (91.7 kg) (07/28 1100)  Intake/Output from previous day: 07/28 0701 - 07/29 0700 In: 730 [P.O.:730] Out: -  Intake/Output this shift: No intake/output data recorded.  PE: Much less erythema especially over breast. Still has an intense erythematous region in the flank and back.   Recent Labs  03/30/16 0347 03/31/16 0611  WBC 10.2 7.0  HGB 10.7* 11.1*  HCT 31.8* 35.1*  NA 142 141  K 3.4* 4.0  CL 107 112*  CO2 26 24  BUN 8 6  CREATININE 0.94 0.90    Studies/Results: Ct Chest W Contrast  Result Date: 03/29/2016 CLINICAL DATA:  Evaluate cellulitis of the chest wall. EXAM: CT CHEST WITH CONTRAST TECHNIQUE: Multidetector CT imaging of the chest was performed during intravenous contrast administration. CONTRAST:  75 ISOVUE-300 IOPAMIDOL (ISOVUE-300) INJECTION 61% COMPARISON:  Chest CT- 10/19/2011; chest radiograph - 03/02/2016 FINDINGS: Cardiovascular: Normal heart size.  No pericardial effusion. Although this examination was not tailored for the evaluation the pulmonary arteries, there are no discrete filling defects within the central pulmonary arterial tree to suggest central pulmonary embolism. Normal caliber of the main pulmonary artery. Scattered minimal amount of atherosclerotic plaque within the thoracic aorta. No definite thoracic aortic dissection is nongated examination. Conventional configuration of the aortic arch. Branch vessel of the apparent appear widely patent throughout their imaged course. Mediastinum/Nodes: No bulky mediastinal, hilar axillary lymphadenopathy. Lungs/Pleura: Mild apical predominant centrilobular  emphysematous change. No focal airspace opacities. No pleural effusion or pneumothorax. The center pulmonary airways appear widely patent. No discrete pulmonary nodules. Upper Abdomen: Limited early arterial phase evaluation of the upper abdomen demonstrates enlargement of the common bile duct measuring approximately 0.9 cm in diameter with associated mild centralized intrahepatic biliary duct dilatation, likely the sequela of post cholecystectomy state. Incidental note is made of a small splenule. Suspected small hiatal hernia. Musculoskeletal: No acute or aggressive osseous abnormalities. Stigmata of DISH within the thoracic spine. Post left breast augmentation. There is marked asymmetric skin thickening about the anterior aspect of the left breast breast prosthesis measuring approximately 1.1 cm in diameter (representative images 59 in 73, series 3). No subcutaneous emphysema. No definable/drainable fluid collection, though note, the peripheral lateral aspect of the prosthesis was not imaged. Normal appearance of the thyroid gland. IMPRESSION: 1. No acute cardiopulmonary disease. 2. Post left breast augmentation with marked asymmetric skin thickening about the anterior aspect of the breast prosthesis, likely compatible with provided history of cellulitis. No subcutaneous emphysema or definable / drainable fluid collection, though note, the peripheral / lateral aspect of the prosthesis was not imaged. 3.  Aortic Atherosclerosis (ICD10-170.0) 4.  Emphysema. [id number]) Electronically Signed   By: Sandi Mariscal M.D.   On: 03/29/2016 21:17   Assessment/Plan: Appears to be responding to antibiotics. Primary infection of the breast implant, which would be unusual this far out from reconstruction, looks less likely but still warrants consideration. Agree with continuation of IV antibiotics.  LOS: 2 days    Crissie Reese M 03/31/2016 10:34 AM

## 2016-03-31 NOTE — Progress Notes (Signed)
PROGRESS NOTE  [name]  EGB:[id number] DOB: 1950-10-16  DOA: 03/29/2016 PCP: [name], MD   Brief Narrative:  65 year old female with PMH of left breast cancer status post reconstructive surgery August 2016, subsequent treatment for flap infection September 2016, HTN, anxiety, depression, GERD, recent hospitalization earlier this month for idiopathic pancreatitis felt to be related to HCTZ-discontinued and has follow-up with [name], [facility] on 04/25/16 at 10 AM to discuss possible EUS, presented directly from Dr. Alvira Philips (plastic surgery) office with fairly acute onset of small red spot with associated pain underneath the left breast/upper chest wall which rapidly progressed involving significant area of the left anterior chest wall, lateral chest and back. Admitted for presumed cellulitis. CT chest negative for collections. Infectious disease consulted 7/28.   Assessment & Plan:   Principal Problem:   Cellulitis of chest wall Active Problems:   GERD (gastroesophageal reflux disease)   Anxiety disorder   History of Breast cancer of upper-outer quadrant of left female breast s/p breast reconstruction   History of Pancreatitis (July 2017)   Depression   Hypothyroidism   HTN (hypertension)   Cellulitis of left chest wall (anterior, lateral and posterior) - in a patient with breast cancer reconstructive surgery. - CT chest without subcutaneous emphysema or drainable fluid. - Blood cultures 2: Neg to date. Lactate normal. CK: 31 - Empirically started on IV vancomycin and Zosyn on 7/27. ESR 5. - Dr. Harlow Mares, plastic surgery consultation appreciated: Asking question if this could be shingles and indicates reluctance to perform surgical exploration of the reconstruction at present. Clinically does not appear like shingles but have requested infectious disease consultation-wasn't done yesterday and requested again 7/29. As per plastic surgery follow-up, primary infection of  breast implant would be unusual this far out from reconstruction, looks less likely but still warrants consideration. - As per infectious disease recommendation, discontinued vancomycin and Zosyn and started IV cefazolin for possible strep cellulitis.  Anemia - Stable.   Hypokalemia - Replaced.  History of pancreatitis, July 2017 - Resolved. Outpatient follow-up with [name] on 8/23 for further workup.  Essential hypertension - Controlled off of antihypertensives: Norvasc held on admission. HCTZ discontinued prior admission due to pancreatitis.  Hypothyroid - Continue Synthroid. Asymptomatic mild intermittent sinus bradycardia. Check TSH.  GERD - Continue PPI  Anxiety and depression - Continue Effexor >patient declining due to nausea per RN.  DVT prophylaxis: Lovenox Code Status: Full Family Communication: Discussed in detail with the patient. No family at bedside. Disposition Plan: DC home when clinically improved.   Consultants:   Plastic Surgery/Dr. Harlow Mares.  Infectious Disease  Procedures:   None  Antimicrobials:   IV Vancomycin 7/27> 7/29  IV Zosyn 7/28> 7/29  IV cefazolin 7/29 >    Subjective: Patient seen with her female RN in room. Pain has improved and so has redness.   Objective:  Vitals:   03/30/16 2130 03/31/16 0700 03/31/16 0900 03/31/16 1300  BP: (!) 105/46 (!) 113/47  109/65  Pulse:  (!) 53  (!) 58  Resp:      Temp:  98.3 F (36.8 C)  98.1 F (36.7 C)  TempSrc:  Oral  Oral  SpO2:  100%  98%  Weight:   91.6 kg (202 lb)   Height:   _0  (1.6 m)     Intake/Output Summary (Last 24 hours) at 03/31/16 1426 Last data filed at 03/30/16 1907  Gross per 24 hour  Intake  520 ml  Output                0 ml  Net              520 ml   Filed Weights   03/30/16 1100 03/31/16 0900  Weight: 91.7 kg (202 lb 2.6 oz) 91.6 kg (202 lb)    Examination: Patient was examined with a female RN-chaperone in the room.  General exam:  Pleasant middle-aged female sitting up comfortably in the bed. Does not appear septic or toxic. Respiratory system: Clear to auscultation. Respiratory effort normal. Cardiovascular system: S1 & S2 heard, RRR. No JVD, murmurs, rubs, gallops or clicks. No pedal edema. Gastrointestinal system: Abdomen is nondistended, soft and nontender. No organomegaly or masses felt. Normal bowel sounds heard. Central nervous system: Alert and oriented. No focal neurological deficits. Extremities: Symmetric 5 x 5 power. Skin: significant patchy erythema, induration without tenderness or significant warmth over reconstructed left breast/anterior, lateral and posterior chest wall. More prominent anteriorly. This area is demarcated by pen mark. Please see pictures in H&P for initial presentation in ED. Small patchy area of erythema over left upper quadrant abdomen. No vesicles. All these findings have improved by at least 25% compared to 7/28. Psychiatry: Judgement and insight appear normal. Mood & affect appropriate.    Data Reviewed: I have personally reviewed following labs and imaging studies  CBC:  Recent Labs Lab 03/29/16 1649 03/30/16 0347 03/31/16 0611  WBC 12.3* 10.2 7.0  NEUTROABS 8.2*  --   --   HGB 11.4* 10.7* 11.1*  HCT 34.4* 31.8* 35.1*  MCV 93.2 93.5 95.9  PLT 215 203 151   Basic Metabolic Panel:  Recent Labs Lab 03/29/16 1649 03/29/16 1749 03/30/16 0347 03/31/16 0611  NA 140  --  142 141  K 4.0  --  3.4* 4.0  CL 108  --  107 112*  CO2 24  --  26 24  GLUCOSE 101*  --  113* 118*  BUN 9  --  8 6  CREATININE 0.74  --  0.94 0.90  CALCIUM 8.6*  --  8.5* 8.7*  MG  --  1.8  --   --   PHOS  --  3.3  --   --    GFR: Estimated Creatinine Clearance: 66.1 mL/min (by C-G formula based on SCr of 0.9 mg/dL). Liver Function Tests:  Recent Labs Lab 03/29/16 1649 03/30/16 0347  AST 32 62*  ALT 35 51  ALKPHOS 56 63  BILITOT 0.8 0.8  PROT 5.6* 5.4*  ALBUMIN 3.3* 3.0*   No results  for input(s): LIPASE, AMYLASE in the last 168 hours. No results for input(s): AMMONIA in the last 168 hours. Coagulation Profile:  Recent Labs Lab 03/29/16 1749  INR 1.00   Cardiac Enzymes:  Recent Labs Lab 03/29/16 1649  CKTOTAL 31*   BNP (last 3 results) No results for input(s): PROBNP in the last 8760 hours. HbA1C: No results for input(s): HGBA1C in the last 72 hours. CBG: No results for input(s): GLUCAP in the last 168 hours. Lipid Profile: No results for input(s): CHOL, HDL, LDLCALC, TRIG, CHOLHDL, LDLDIRECT in the last 72 hours. Thyroid Function Tests: No results for input(s): TSH, T4TOTAL, FREET4, T3FREE, THYROIDAB in the last 72 hours. Anemia Panel: No results for input(s): VITAMINB12, FOLATE, FERRITIN, TIBC, IRON, RETICCTPCT in the last 72 hours.  Sepsis Labs:  Recent Labs Lab 03/29/16 1749  LATICACIDVEN 1.5    Recent Results (from the past 240 hour(s))  Culture, blood (Routine X 2) w Reflex to ID Panel     Status: None (Preliminary result)   Collection Time: 03/29/16  4:58 PM  Result Value Ref Range Status   Specimen Description BLOOD RIGHT ANTECUBITAL  Final   Special Requests IN PEDIATRIC BOTTLE 2CC  Final   Culture NO GROWTH 2 DAYS  Final   Report Status PENDING  Incomplete  Culture, blood (Routine X 2) w Reflex to ID Panel     Status: None (Preliminary result)   Collection Time: 03/29/16  5:03 PM  Result Value Ref Range Status   Specimen Description BLOOD RIGHT ANTECUBITAL  Final   Special Requests IN PEDIATRIC BOTTLE 3CC  Final   Culture NO GROWTH 2 DAYS  Final   Report Status PENDING  Incomplete         Radiology Studies: Ct Chest W Contrast  Result Date: 03/29/2016 CLINICAL DATA:  Evaluate cellulitis of the chest wall. EXAM: CT CHEST WITH CONTRAST TECHNIQUE: Multidetector CT imaging of the chest was performed during intravenous contrast administration. CONTRAST:  75 ISOVUE-300 IOPAMIDOL (ISOVUE-300) INJECTION 61% COMPARISON:  Chest CT-  10/19/2011; chest radiograph - 03/02/2016 FINDINGS: Cardiovascular: Normal heart size.  No pericardial effusion. Although this examination was not tailored for the evaluation the pulmonary arteries, there are no discrete filling defects within the central pulmonary arterial tree to suggest central pulmonary embolism. Normal caliber of the main pulmonary artery. Scattered minimal amount of atherosclerotic plaque within the thoracic aorta. No definite thoracic aortic dissection is nongated examination. Conventional configuration of the aortic arch. Branch vessel of the apparent appear widely patent throughout their imaged course. Mediastinum/Nodes: No bulky mediastinal, hilar axillary lymphadenopathy. Lungs/Pleura: Mild apical predominant centrilobular emphysematous change. No focal airspace opacities. No pleural effusion or pneumothorax. The center pulmonary airways appear widely patent. No discrete pulmonary nodules. Upper Abdomen: Limited early arterial phase evaluation of the upper abdomen demonstrates enlargement of the common bile duct measuring approximately 0.9 cm in diameter with associated mild centralized intrahepatic biliary duct dilatation, likely the sequela of post cholecystectomy state. Incidental note is made of a small splenule. Suspected small hiatal hernia. Musculoskeletal: No acute or aggressive osseous abnormalities. Stigmata of DISH within the thoracic spine. Post left breast augmentation. There is marked asymmetric skin thickening about the anterior aspect of the left breast breast prosthesis measuring approximately 1.1 cm in diameter (representative images 59 in 73, series 3). No subcutaneous emphysema. No definable/drainable fluid collection, though note, the peripheral lateral aspect of the prosthesis was not imaged. Normal appearance of the thyroid gland. IMPRESSION: 1. No acute cardiopulmonary disease. 2. Post left breast augmentation with marked asymmetric skin thickening about the  anterior aspect of the breast prosthesis, likely compatible with provided history of cellulitis. No subcutaneous emphysema or definable / drainable fluid collection, though note, the peripheral / lateral aspect of the prosthesis was not imaged. 3.  Aortic Atherosclerosis (ICD10-170.0) 4.  Emphysema. ([id number]) Electronically Signed   By: Sandi Mariscal M.D.   On: 03/29/2016 21:17       Scheduled Meds: . aspirin  81 mg Oral Daily  .  ceFAZolin (ANCEF) IV  1 g Intravenous Q8H  . cholecalciferol  2,000 Units Oral Daily  . enoxaparin (LOVENOX) injection  40 mg Subcutaneous Q24H  . levothyroxine  50 mcg Oral QAC breakfast  . pantoprazole  40 mg Oral Daily  . venlafaxine XR  75 mg Oral Daily   Continuous Infusions:     LOS: 2 days    Time spent: 25  minutes.    [facility], MD [facility] Pager [phone]  If 7PM-7AM, please contact night-coverage [email or web address] Password [email or web address] 03/31/2016, 2:26 PM

## 2016-03-31 NOTE — Progress Notes (Signed)
Pharmacy Antibiotic Note  [name] is a 66 y.o. female admitted on 03/29/2016 with cellulitis.  Pharmacy has been consulted for cefazolin dosing.  -WBC WNL -Afebrile  Plan: -Cefazolin 1g q8h -Monitor renal function, cultures, patient progress  Weight: 202 lb 2.6 oz (91.7 kg) (from 03/06/16)  Temp (24hrs), Avg:98.2 F (36.8 C), Min:98.1 F (36.7 C), Max:98.3 F (36.8 C)   Recent Labs Lab 03/29/16 1649 03/29/16 1749 03/30/16 0347 03/31/16 0611  WBC 12.3*  --  10.2 7.0  CREATININE 0.74  --  0.94 0.90  LATICACIDVEN  --  1.5  --   --     Estimated Creatinine Clearance: 66.1 mL/min (by C-G formula based on SCr of 0.9 mg/dL).    Allergies  Allergen Reactions  . Other Itching and Rash    Patient states that she was unable to use supportive wrap they provided at [facility] following a breast removal and reconstruction.    Antimicrobials this admission: Zosyn 7/27>>7/29 Vanc 7/27>>7/29 Cefazolin 7/29>>  Microbiology results:  -BCx x2 7/27: ngtd  Thank you for allowing pharmacy to be a part of this patient's care.  Myer Peer Grayland Ormond), PharmD  PGY1 Pharmacy Resident Pager: [phone] 03/31/2016 9:24 AM

## 2016-04-01 DIAGNOSIS — B029 Zoster without complications: Secondary | ICD-10-CM

## 2016-04-01 LAB — HIV ANTIBODY (ROUTINE TESTING W REFLEX): HIV SCREEN 4TH GENERATION: NONREACTIVE

## 2016-04-01 MED ORDER — VALACYCLOVIR HCL 1 G PO TABS: 1000.0000 mg | ORAL_TABLET | Freq: Three times a day (TID) | ORAL | 0 refills | Status: DC

## 2016-04-01 MED ORDER — ACETAMINOPHEN 325 MG PO TABS: 650.0000 mg | ORAL_TABLET | Freq: Four times a day (QID) | ORAL | Status: DC | PRN

## 2016-04-01 MED ORDER — CEPHALEXIN 500 MG PO CAPS: 500.0000 mg | ORAL_CAPSULE | Freq: Four times a day (QID) | ORAL | 0 refills | Status: DC

## 2016-04-01 NOTE — Discharge Instructions (Signed)
Cellulitis Cellulitis is an infection of the skin and the tissue beneath it. The infected area is usually red and tender. Cellulitis occurs most often in the arms and lower legs.  CAUSES  Cellulitis is caused by bacteria that enter the skin through cracks or cuts in the skin. The most common types of bacteria that cause cellulitis are staphylococci and streptococci. SIGNS AND SYMPTOMS   Redness and warmth.  Swelling.  Tenderness or pain.  Fever. DIAGNOSIS  Your health care provider can usually determine what is wrong based on a physical exam. Blood tests may also be done. TREATMENT  Treatment usually involves taking an antibiotic medicine. HOME CARE INSTRUCTIONS   Take your antibiotic medicine as directed by your health care provider. Finish the antibiotic even if you start to feel better.  Keep the infected arm or leg elevated to reduce swelling.  Apply a warm cloth to the affected area up to 4 times per day to relieve pain.  Take medicines only as directed by your health care provider.  Keep all follow-up visits as directed by your health care provider. SEEK MEDICAL CARE IF:   You notice red streaks coming from the infected area.  Your red area gets larger or turns dark in color.  Your bone or joint underneath the infected area becomes painful after the skin has healed.  Your infection returns in the same area or another area.  You notice a swollen bump in the infected area.  You develop new symptoms.  You have a fever. SEEK IMMEDIATE MEDICAL CARE IF:   You feel very sleepy.  You develop vomiting or diarrhea.  You have a general ill feeling (malaise) with muscle aches and pains.   This information is not intended to replace advice given to you by your health care provider. Make sure you discuss any questions you have with your health care provider.   Document Released: 05/30/2005 Document Revised: 05/11/2015 Document Reviewed: 11/05/2011 Elsevier Interactive  Patient Education 2016 [place], which is also known as herpes zoster, is an infection that causes a painful skin rash and fluid-filled blisters. Shingles is not related to genital herpes, which is a sexually transmitted infection.   Shingles only develops in people who:  Have had chickenpox.  Have received the chickenpox vaccine. (This is rare.) CAUSES Shingles is caused by varicella-zoster virus (VZV). This is the same virus that causes chickenpox. After exposure to VZV, the virus stays in the body in an inactive (dormant) state. Shingles develops if the virus reactivates. This can happen many years after the initial exposure to VZV. It is not known what causes this virus to reactivate. RISK FACTORS People who have had chickenpox or received the chickenpox vaccine are at risk for shingles. Infection is more common in people who:  Are older than age 44.  Have a weakened defense (immune) system, such as those with HIV, AIDS, or cancer.  Are taking medicines that weaken the immune system, such as transplant medicines.  Are under great stress. SYMPTOMS Early symptoms of this condition include itching, tingling, and pain in an area on your skin. Pain may be described as burning, stabbing, or throbbing. A few days or weeks after symptoms start, a painful red rash appears, usually on one side of the body in a bandlike or beltlike pattern. The rash eventually turns into fluid-filled blisters that break open, scab over, and dry up in about 2-3 weeks. At any time during the infection, you may also  develop:  A fever.  Chills.  A headache.  An upset stomach. DIAGNOSIS This condition is diagnosed with a skin exam. Sometimes, skin or fluid samples are taken from the blisters before a diagnosis is made. These samples are examined under a microscope or sent to a lab for testing. TREATMENT There is no specific cure for this condition. Your health care provider will  probably prescribe medicines to help you manage pain, recover more quickly, and avoid long-term problems. Medicines may include:  Antiviral drugs.  Anti-inflammatory drugs.  Pain medicines. If the area involved is on your face, you may be referred to a specialist, such as an eye doctor (ophthalmologist) or an ear, nose, and throat (ENT) doctor to help you avoid eye problems, chronic pain, or disability. HOME CARE INSTRUCTIONS Medicines  Take medicines only as directed by your health care provider.  Apply an anti-itch or numbing cream to the affected area as directed by your health care provider. Blister and Rash Care  Take a cool bath or apply cool compresses to the area of the rash or blisters as directed by your health care provider. This may help with pain and itching.  Keep your rash covered with a loose bandage (dressing). Wear loose-fitting clothing to help ease the pain of material rubbing against the rash.  Keep your rash and blisters clean with mild soap and cool water or as directed by your health care provider.  Check your rash every day for signs of infection. These include redness, swelling, and pain that lasts or increases.  Do not pick your blisters.  Do not scratch your rash. General Instructions  Rest as directed by your health care provider.  Keep all follow-up visits as directed by your health care provider. This is important.  Until your blisters scab over, your infection can cause chickenpox in people who have never had it or been vaccinated against it. To prevent this from happening, avoid contact with other people, especially:  Babies.  Pregnant women.  Children who have eczema.  Elderly people who have transplants.  People who have chronic illnesses, such as leukemia or AIDS. SEEK MEDICAL CARE IF:  Your pain is not relieved with prescribed medicines.  Your pain does not get better after the rash heals.  Your rash looks infected. Signs of  infection include redness, swelling, and pain that lasts or increases. SEEK IMMEDIATE MEDICAL CARE IF:  The rash is on your face or nose.  You have facial pain, pain around your eye area, or loss of feeling on one side of your face.  You have ear pain or you have ringing in your ear.  You have loss of taste.  Your condition gets worse.   This information is not intended to replace advice given to you by your health care provider. Make sure you discuss any questions you have with your health care provider.   Document Released: 08/20/2005 Document Revised: 09/10/2014 Document Reviewed: 07/01/2014 Elsevier Interactive Patient Education [employer].

## 2016-04-01 NOTE — Discharge Summary (Signed)
Physician Discharge Summary  [name] NWG:[id number] DOB: 1950-12-26  PCP: [name], MD  Admit date: 03/29/2016 Discharge date: 04/01/2016  Admitted From: Home Disposition:  Home  Recommendations for Outpatient Follow-up:  1. Dr. Levin Erp, PCP in 5 days with repeat labs (CBC & BMP). Please follow final blood culture results that were sent from the hospital on 03/29/16. Please also follow HIV antibody and hepatitis C antibody results that were sent from the hospital on 04/01/16. 2. Dr. Crissie Reese, Plastic surgery in 1 week.  Home Health: None Equipment/Devices: None    Discharge Condition: Improved and stable.  CODE STATUS: Full  Diet recommendation: Heart healthy diet.  Discharge Diagnoses:  Principal Problem:   Cellulitis of chest wall Active Problems:   GERD (gastroesophageal reflux disease)   Anxiety disorder   History of Breast cancer of upper-outer quadrant of left female breast s/p breast reconstruction   History of Pancreatitis (July 2017)   Depression   Hypothyroidism   HTN (hypertension)   Brief/Interim Summary: Brief Narrative:  65 year old female with PMH of left breast cancer status post reconstructive surgery August 2016, subsequent treatment for flap infection September 2016, HTN, anxiety, depression, GERD, recent hospitalization earlier this month for idiopathic pancreatitis felt to be related to HCTZ-discontinued and has follow-up with [name], [facility] on 04/25/16 at 10 AM to discuss possible EUS, presented directly from Dr. Alvira Philips (plastic surgery) office with fairly acute onset of small red spot with associated pain underneath the left breast/upper chest wall which rapidly progressed involving significant area of the left anterior chest wall, lateral chest and back. Admitted for presumed cellulitis. CT chest negative for collections. Infectious disease consulted 7/28.  Cellulitis of left chest wall - in a patient with breast cancer  reconstructive surgery. - CT chest without subcutaneous emphysema or drainable fluid. - Blood cultures 2: Neg to date. Lactate normal. CK: 31 - Empirically started on IV vancomycin and Zosyn on 7/27. ESR 5. - Dr. Harlow Mares, plastic surgery consultation appreciated: Asking question if this could be shingles and indicates reluctance to perform surgical exploration of the reconstruction at present. As per plastic surgery follow-up, primary infection of breast implant seems less likely. - Infectious disease consulted on 7/30 and recommended discontinuing vancomycin + Zosyn and change to IV cefazolin while hospitalized and oral cephalexin 500 MG 4 times a day at discharge to complete a total 10 days treatment. - Clinically much improved.  Possible shingles - Lesions noted over left upper quadrant of abdomen. As per infectious disease consultation, difficult to tell if concomitant shingles occurrence. Started empirically on Valtrex and recommend completing 7 days treatment. As per discussion with infectious disease today, patient may apply dressing to that site and wash her hands well after dressing changes (has elderly/43 year old mother at home) but no other specific precautions recommended.  Health maintenance - Infectious disease drew HIV antibodies and hepatitis C antibodies on 7/30, results pending and can be followed up as outpatient during follow-up with PCP.  Anemia - Stable.   Hypokalemia - Replaced.  History of pancreatitis, July 2017 - Resolved. Outpatient follow-up with [name] on 8/23 for further workup.  Essential hypertension - Norvasc had been held in the hospital due to soft blood pressures but will be resumed at discharge. HCTZ discontinued prior admission due to pancreatitis.  Hypothyroid - Continue Synthroid. Asymptomatic mild intermittent sinus bradycardia. TSH: 1.631  GERD - Continue PPI  Anxiety and depression - Patient stated that she discontinue taking  Effexor almost a  month prior to admission due to side effects i.e. nausea. Stable.   Consultants:   Plastic Surgery/Dr. Harlow Mares.  Infectious Disease  Procedures:   None   Discharge Instructions  Discharge Instructions    Call MD for:  extreme fatigue    Complete by:  As directed   Call MD for:  persistant dizziness or light-headedness    Complete by:  As directed   Call MD for:  persistant nausea and vomiting    Complete by:  As directed   Call MD for:  redness, tenderness, or signs of infection (pain, swelling, redness, odor or green/yellow discharge around incision site)    Complete by:  As directed   Call MD for:  severe uncontrolled pain    Complete by:  As directed   Call MD for:  temperature >100.4    Complete by:  As directed   Diet - low sodium heart healthy    Complete by:  As directed   Increase activity slowly    Complete by:  As directed       Medication List    TAKE these medications   acetaminophen 325 MG tablet Commonly known as:  TYLENOL Take 2 tablets (650 mg total) by mouth every 6 (six) hours as needed for mild pain, moderate pain, fever or headache (or Fever >/= 101).   amLODipine 5 MG tablet Commonly known as:  NORVASC Take 1 tablet (5 mg total) by mouth daily.   aspirin 81 MG chewable tablet Chew 81 mg by mouth daily.   Biotin 5000 MCG Tabs Take 1 tablet by mouth daily.   CALCIUM 600 + D PO Take 2 tablets by mouth daily.   CENTRUM SILVER PO Take 1 tablet by mouth daily.   cephALEXin 500 MG capsule Commonly known as:  KEFLEX Take 1 capsule (500 mg total) by mouth 4 (four) times daily.   chlorhexidine 0.12 % solution Commonly known as:  PERIDEX As directed   esomeprazole 20 MG capsule Commonly known as:  NEXIUM Take 20 mg by mouth daily as needed (For heartburn.).   levothyroxine 50 MCG tablet Commonly known as:  SYNTHROID, LEVOTHROID Take 50 mcg by mouth daily.   Probiotic Caps Take 1 capsule by mouth every morning.    tamoxifen 20 MG tablet Commonly known as:  NOLVADEX Take 1 tablet (20 mg total) by mouth daily.   valACYclovir 1000 MG tablet Commonly known as:  VALTREX Take 1 tablet (1,000 mg total) by mouth 3 (three) times daily.   Vitamin D 2000 units tablet Take 2,000 Units by mouth daily.      Follow-up Information    GREEN, Keenan Bachelor, MD. Schedule an appointment as soon as possible for a visit in 5 day(s).   Specialty:  Internal Medicine Why:  To be seen with repeat labs (CBC & BMP). Contact information: [address] [place] [address] [phone]        Macon Large, MD. Schedule an appointment as soon as possible for a visit in 1 week(s).   Specialty:  Plastic Surgery Contact information: [address] [place] [address] [phone]          Allergies  Allergen Reactions  . Other Itching and Rash    Patient states that she was unable to use supportive wrap they provided at [facility] following a breast removal and reconstruction.       Procedures/Studies: Ct Chest W Contrast  Result Date: 03/29/2016 CLINICAL DATA:  Evaluate cellulitis of the chest wall. EXAM: CT CHEST WITH CONTRAST TECHNIQUE: Multidetector CT imaging of the chest was performed during intravenous contrast administration. CONTRAST:  75 ISOVUE-300 IOPAMIDOL (ISOVUE-300) INJECTION 61% COMPARISON:  Chest CT- 10/19/2011; chest radiograph - 03/02/2016 FINDINGS: Cardiovascular: Normal heart size.  No pericardial effusion. Although this examination was not tailored for the evaluation the pulmonary arteries, there are no discrete filling defects within the central pulmonary arterial tree to suggest central pulmonary embolism. Normal caliber of the main pulmonary artery. Scattered minimal amount of atherosclerotic plaque within the thoracic aorta. No definite thoracic aortic dissection is nongated examination. Conventional configuration of the aortic arch. Branch vessel of the  apparent appear widely patent throughout their imaged course. Mediastinum/Nodes: No bulky mediastinal, hilar axillary lymphadenopathy. Lungs/Pleura: Mild apical predominant centrilobular emphysematous change. No focal airspace opacities. No pleural effusion or pneumothorax. The center pulmonary airways appear widely patent. No discrete pulmonary nodules. Upper Abdomen: Limited early arterial phase evaluation of the upper abdomen demonstrates enlargement of the common bile duct measuring approximately 0.9 cm in diameter with associated mild centralized intrahepatic biliary duct dilatation, likely the sequela of post cholecystectomy state. Incidental note is made of a small splenule. Suspected small hiatal hernia. Musculoskeletal: No acute or aggressive osseous abnormalities. Stigmata of DISH within the thoracic spine. Post left breast augmentation. There is marked asymmetric skin thickening about the anterior aspect of the left breast breast prosthesis measuring approximately 1.1 cm in diameter (representative images 59 in 73, series 3). No subcutaneous emphysema. No definable/drainable fluid collection, though note, the peripheral lateral aspect of the prosthesis was not imaged. Normal appearance of the thyroid gland. IMPRESSION: 1. No acute cardiopulmonary disease. 2. Post left breast augmentation with marked asymmetric skin thickening about the anterior aspect of the breast prosthesis, likely compatible with provided history of cellulitis. No subcutaneous emphysema or definable / drainable fluid collection, though note, the peripheral / lateral aspect of the prosthesis was not imaged. 3.  Aortic Atherosclerosis (ICD10-170.0) 4.  Emphysema. (OAC16-S06.9) Electronically Signed   By: Sandi Mariscal M.D.   On: 03/29/2016 21:17     Subjective: Anxious to go home. No pain reported. Improving redness of the chest wall. Denies any other complaints.  Discharge Exam:  Vitals:   03/31/16 0900 03/31/16 1300 03/31/16  2154 04/01/16 0658  BP:  109/65 (!) 141/69 (!) 134/53  Pulse:  (!) 58 68 62  Resp:   16 16  Temp:  98.1 F (36.7 C) 98.8 F (37.1 C) 98.1 F (36.7 C)  TempSrc:  Oral Oral Oral  SpO2:  98% 99% 99%  Weight: 91.6 kg (202 lb)     Height: '5\' 3"'  (1.6 m)       Patient was examined with a female RN-chaperone in the room.  General exam: Pleasant middle-aged female sitting up comfortably in the bed. Does not appear septic or toxic. Respiratory system: Clear to auscultation. Respiratory effort normal. Cardiovascular system: S1 & S2 heard, RRR. No JVD, murmurs, rubs, gallops or clicks. No pedal edema. Gastrointestinal system: Abdomen is nondistended, soft and nontender. No organomegaly or masses felt. Normal bowel sounds heard. Central nervous system: Alert and oriented. No focal neurological deficits. Extremities: Symmetric 5 x 5 power. Skin:  surgical scar over left breast from reconstruction surgery. Patchy erythema over left breast extending to back in T6-T8 region which has significantly improved compared to admission-resolved in many areas and receding from lying delineation on admission. Small cluster of redness over her anterior abdominal wall T8 region. Psychiatry: Judgement  and insight appear normal. Mood & affect appropriate.     The results of significant diagnostics from this hospitalization (including imaging, microbiology, ancillary and laboratory) are listed below for reference.     Microbiology: Recent Results (from the past 240 hour(s))  Culture, blood (Routine X 2) w Reflex to ID Panel     Status: None (Preliminary result)   Collection Time: 03/29/16  4:58 PM  Result Value Ref Range Status   Specimen Description BLOOD RIGHT ANTECUBITAL  Final   Special Requests IN PEDIATRIC BOTTLE 2CC  Final   Culture NO GROWTH 3 DAYS  Final   Report Status PENDING  Incomplete  Culture, blood (Routine X 2) w Reflex to ID Panel     Status: None (Preliminary result)   Collection Time:  03/29/16  5:03 PM  Result Value Ref Range Status   Specimen Description BLOOD RIGHT ANTECUBITAL  Final   Special Requests IN PEDIATRIC BOTTLE 3CC  Final   Culture NO GROWTH 3 DAYS  Final   Report Status PENDING  Incomplete     Labs: BNP (last 3 results) No results for input(s): BNP in the last 8760 hours. Basic Metabolic Panel:  Recent Labs Lab 03/29/16 1649 03/29/16 1749 03/30/16 0347 03/31/16 0611  NA 140  --  142 141  K 4.0  --  3.4* 4.0  CL 108  --  107 112*  CO2 24  --  26 24  GLUCOSE 101*  --  113* 118*  BUN 9  --  8 6  CREATININE 0.74  --  0.94 0.90  CALCIUM 8.6*  --  8.5* 8.7*  MG  --  1.8  --   --   PHOS  --  3.3  --   --    Liver Function Tests:  Recent Labs Lab 03/29/16 1649 03/30/16 0347  AST 32 62*  ALT 35 51  ALKPHOS 56 63  BILITOT 0.8 0.8  PROT 5.6* 5.4*  ALBUMIN 3.3* 3.0*   No results for input(s): LIPASE, AMYLASE in the last 168 hours. No results for input(s): AMMONIA in the last 168 hours. CBC:  Recent Labs Lab 03/29/16 1649 03/30/16 0347 03/31/16 0611  WBC 12.3* 10.2 7.0  NEUTROABS 8.2*  --   --   HGB 11.4* 10.7* 11.1*  HCT 34.4* 31.8* 35.1*  MCV 93.2 93.5 95.9  PLT 215 203 202   Cardiac Enzymes:  Recent Labs Lab 03/29/16 1649  CKTOTAL 31*   BNP: Invalid input(s): POCBNP CBG: No results for input(s): GLUCAP in the last 168 hours. D-Dimer No results for input(s): DDIMER in the last 72 hours. Hgb A1c No results for input(s): HGBA1C in the last 72 hours. Lipid Profile No results for input(s): CHOL, HDL, LDLCALC, TRIG, CHOLHDL, LDLDIRECT in the last 72 hours. Thyroid function studies  Recent Labs  03/31/16 1519  TSH 1.631   Anemia work up No results for input(s): VITAMINB12, FOLATE, FERRITIN, TIBC, IRON, RETICCTPCT in the last 72 hours.    Time coordinating discharge: Over 30 minutes  SIGNED:  Vernell Leep, MD, FACP, [place]. [facility] Pager [phone]  If 7PM-7AM, please contact  night-coverage [email or web address] Password [facility] 04/01/2016, 12:39 PM

## 2016-04-01 NOTE — Progress Notes (Signed)
She feels much better. No Pain.  PE: Erythema has resolved over breast. It persists in the lateral chest and back where this all began.  It does not appear that the implant is the cause.  Agree with continued antibiotics until erythema has resolved.   I will see her a week from Monday in the office

## 2016-04-02 LAB — HEPATITIS C ANTIBODY

## 2016-04-03 LAB — CULTURE, BLOOD (ROUTINE X 2)
CULTURE: NO GROWTH
Culture: NO GROWTH

## 2016-04-06 DIAGNOSIS — E785 Hyperlipidemia, unspecified: Secondary | ICD-10-CM | POA: Diagnosis not present

## 2016-04-06 DIAGNOSIS — E7800 Pure hypercholesterolemia, unspecified: Secondary | ICD-10-CM | POA: Diagnosis not present

## 2016-04-06 DIAGNOSIS — E039 Hypothyroidism, unspecified: Secondary | ICD-10-CM | POA: Diagnosis not present

## 2016-04-06 DIAGNOSIS — D559 Anemia due to enzyme disorder, unspecified: Secondary | ICD-10-CM | POA: Diagnosis not present

## 2016-04-09 DIAGNOSIS — L03313 Cellulitis of chest wall: Secondary | ICD-10-CM | POA: Diagnosis not present

## 2016-04-09 DIAGNOSIS — Z9882 Breast implant status: Secondary | ICD-10-CM | POA: Diagnosis not present

## 2016-04-12 DIAGNOSIS — M25512 Pain in left shoulder: Secondary | ICD-10-CM | POA: Diagnosis not present

## 2016-04-16 DIAGNOSIS — Z9882 Breast implant status: Secondary | ICD-10-CM | POA: Diagnosis not present

## 2016-04-18 ENCOUNTER — Other Ambulatory Visit: Payer: Self-pay

## 2016-04-18 DIAGNOSIS — C50412 Malignant neoplasm of upper-outer quadrant of left female breast: Secondary | ICD-10-CM

## 2016-04-18 MED ORDER — TAMOXIFEN CITRATE 20 MG PO TABS: 20.0000 mg | ORAL_TABLET | Freq: Every day | ORAL | 3 refills | Status: DC

## 2016-04-25 ENCOUNTER — Encounter: Payer: Self-pay | Admitting: Internal Medicine

## 2016-04-25 ENCOUNTER — Ambulatory Visit (INDEPENDENT_AMBULATORY_CARE_PROVIDER_SITE_OTHER): Payer: Medicare Other | Admitting: Internal Medicine

## 2016-04-25 VITALS — BP 118/66 | HR 61 | Ht 63.0 in | Wt 185.0 lb

## 2016-04-25 DIAGNOSIS — K219 Gastro-esophageal reflux disease without esophagitis: Secondary | ICD-10-CM

## 2016-04-25 DIAGNOSIS — K5901 Slow transit constipation: Secondary | ICD-10-CM | POA: Diagnosis not present

## 2016-04-25 DIAGNOSIS — R140 Abdominal distension (gaseous): Secondary | ICD-10-CM

## 2016-04-25 DIAGNOSIS — K858 Other acute pancreatitis without necrosis or infection: Secondary | ICD-10-CM | POA: Diagnosis not present

## 2016-04-25 NOTE — Patient Instructions (Signed)
You will be hearing from our office to schedule an EUS with Dr. Ardis Hughs

## 2016-04-25 NOTE — Progress Notes (Signed)
HISTORY OF PRESENT ILLNESS:  [name] is a 65 y.o. female , previous patient of Dr. Sharlett Iles with below listed medical history, who I was introduced to in early July 2017 after initial evaluation by Dr. Benson Norway for acute pancreatitis. Patient presented with epigastric pain, nausea, and vomiting. Mild elevation of lipase. Normal liver tests. CT scan demonstrated diffuse inflammatory changes within the head of the pancreas compatible with acute pancreatitis. The bile duct was slightly prominent at 8 mm. She is status post cholecystectomy and does not use alcohol. She had been started on hydrochlorothiazide approximately one month prior to presentation and this was the presumptive cause. She recovered uneventfully after a five-day hospital stay. This follow-up arranged. She has had no further problems with abdominal pain. She does report 20 pound weight loss since her hospitalization. She attributes this to diet and walking. She did have a short hospitalization for cellulitis. GI review of systems currently remarkable for bloating, heartburn, and constipation. She does take Nexium for reflux. This helps. No dysphagia. She did undergo complete colonoscopy April 2012 which was normal.  REVIEW OF SYSTEMS:  All non-GI ROS negative except for allergies  Past Medical History:  Diagnosis Date  . Acute pancreatitis 02/2016  . Anxiety    hx  . Cellulitis of chest wall 03/29/2016   "left breast, arm, back"  . Chronic kidney disease    released from [facility] dr[facility] feb 16 states blood found in urine in past-tests done but could not find reason for blood  . Depression    hx  . Ductal carcinoma of left breast ([facility]) 2016   Ductal carcinoma in situ (DCIS) of left breast  . GERD (gastroesophageal reflux disease)   . Heart murmur    "when I was a kid"  . Hypertension   . Hypothyroidism   . PONV (postoperative nausea and vomiting)    with cataract surgery and ist lumpectomy req patch and iv med for nausea     Past Surgical History:  Procedure Laterality Date  . BREAST BIOPSY Left 12/08/2014; 01/2015  . BREAST LUMPECTOMY WITH RADIOACTIVE SEED LOCALIZATION Left 01/04/2015   Procedure: LEFT PARTIAL MASTECTOMY WITH RADIOACTIVE SEED LOCALIZATION;  Surgeon: Fanny Skates, MD;  Location: [facility];  Service: General;  Laterality: Left;  . BREAST RECONSTRUCTION WITH PLACEMENT OF TISSUE EXPANDER AND FLEX HD (ACELLULAR HYDRATED DERMIS) Left 04/07/2015   Procedure: IMMEDIATE LEFT BREAST RECONSTRUCTION WITH PLACEMENT SALINE IMPLANT AND CHEST WALL RECONSTRUCTION WITH  ACELLULAR DERMAL MATRIX ;  Surgeon: Crissie Reese, MD;  Location: [facility];  Service: Plastics;  Laterality: Left;  . CATARACT EXTRACTION    . CATARACT EXTRACTION W/ INTRAOCULAR LENS IMPLANT Left   . CHOLECYSTECTOMY  09/17/2011   Procedure: LAPAROSCOPIC CHOLECYSTECTOMY WITH INTRAOPERATIVE CHOLANGIOGRAM;  Surgeon: [name], MD;  Location: [facility];  Service: General;  Laterality: N/A;  carm   . DENTAL SURGERY     rt upper tooth removed  and crown? placed  . DILATION AND CURETTAGE OF UTERUS    . EYE SURGERY    . HYSTEROSCOPY W/D&C N/A 10/28/2015   Procedure: DILATATION AND CURETTAGE /HYSTEROSCOPY, Myosure;  Surgeon: Alden Hipp, MD;  Location: [facility];  Service: Gynecology;  Laterality: N/A;  . NIPPLE SPARING MASTECTOMY/SENTINAL LYMPH NODE BIOPSY/RECONSTRUCTION/PLACEMENT OF TISSUE EXPANDER Left 04/07/2015   Procedure: LEFT NIPPLE SPARING MASTECTOMY WITH LEFT SENTINAL LYMPH NODE BIOPSY AND  RECONSTRUCTION WITH PLACEMENT OF TISSUE EXPANDER;  Surgeon: Fanny Skates, MD;  Location: [facility];  Service: General;  Laterality: Left;  .  RE-EXCISION OF BREAST LUMPECTOMY Left 01/12/2015   Procedure: LEFT  BREAST LUMPECTOMY RE-EXCISION OF MARGINS;  Surgeon: Fanny Skates, MD;  Location: [facility];  Service: General;  Laterality: Left;    Social History Myrah A Leclere  reports that she quit smoking about 17 months ago. Her smoking use included  Cigarettes. She has a 23.00 pack-year smoking history. She has never used smokeless tobacco. She reports that she drinks alcohol. She reports that she does not use drugs.  family history includes Breast cancer in her maternal aunt; Cancer in her brother; Heart disease in her father and mother; Hypertension in her mother; Leukemia in her father.  Allergies  Allergen Reactions  . Other Itching and Rash    Patient states that she was unable to use supportive wrap they provided at [facility] following a breast removal and reconstruction.       PHYSICAL EXAMINATION: Vital signs: BP 118/66   Pulse 61   Ht 5\' 3"  (1.6 m)   Wt 185 lb (83.9 kg)   BMI 32.77 kg/m   Constitutional: generally well-appearing, no acute distress Psychiatric: alert and oriented x3, cooperative Eyes: extraocular movements intact, anicteric, conjunctiva pink Mouth: oral pharynx moist, no lesions Neck: supple no lymphadenopathy Cardiovascular: heart regular rate and rhythm, no murmur Lungs: clear to auscultation bilaterally Abdomen: soft, Obese, nontender, nondistended, no obvious ascites, no peritoneal signs, normal bowel sounds, no organomegaly Rectal:Omitted Extremities: no clubbing cyanosis or lower extremity edema bilaterally Skin: no lesions on visible extremities Neuro: No focal deficits. Cranial nerves intact  ASSESSMENT:  #1. Acute pancreatitis in a 65 year old. Resolved without apparent recurrence after hospitalization in early July. Etiology not certain but possibly secondary to hydrochlorothiazide therapy. She does not use alcohol and is status post cholecystectomy. Given her age and interval weight loss would evaluate further.  I recommended endoscopic ultrasound as the after acute inflammation has likely subsided. This to rule out structural abnormalities of the pancreas and interrogated the bile duct (rule out CBD stone postcholecystectomy). Patient understands and is agreeable. I will forward this note  to our echo endosonographer Dr. Ardis Hughs for his review. #2. GERD. Symptoms controlled with PPI #3. Constipation predominant IBS. Prior colonoscopy with Dr. Sharlett Iles 2012 was normal   PLAN:  #1. Endoscopic ultrasound as above #2. Reflux precautions #3. Continue PPI. Lowest dose to control symptoms #4. MiraLAX as needed for constipation #5. Routine screening colonoscopy 2022 #6. Interval GI follow-up with myself as needed

## 2016-04-30 NOTE — Progress Notes (Signed)
[name], please let the patient know that Dr. Ardis Hughs reviewed her case and my request regarding imaging of her pancreas. He feels that the best next a up would be MRI/ MRCP of the pancreas (as opposed EUS). If MR exam shows anything at all concerning, then he would consider EUS. If not, he would recommend just clinical follow-up. Please arrange MRI/MRCP of the pancreas. Thanks

## 2016-05-01 NOTE — Progress Notes (Signed)
Left message for pt to call back  °

## 2016-05-03 ENCOUNTER — Other Ambulatory Visit: Payer: Self-pay

## 2016-05-03 DIAGNOSIS — K858 Other acute pancreatitis without necrosis or infection: Secondary | ICD-10-CM

## 2016-05-03 NOTE — Progress Notes (Signed)
Pt scheduled for MR/MRCP of abdomen at [facility] 05/08/16@8am , pt to arrive there at 7:45am. Pt to be NPO after midnight. Pt aware of appt.

## 2016-05-08 ENCOUNTER — Other Ambulatory Visit: Payer: Self-pay | Admitting: Internal Medicine

## 2016-05-08 ENCOUNTER — Ambulatory Visit (HOSPITAL_COMMUNITY)
Admission: RE | Admit: 2016-05-08 | Discharge: 2016-05-08 | Disposition: A | Discharge status: Home / Self Care | Payer: Medicare Other | Source: Ambulatory Visit | Attending: Internal Medicine | Admitting: Internal Medicine

## 2016-05-08 DIAGNOSIS — I700 Atherosclerosis of aorta: Secondary | ICD-10-CM | POA: Diagnosis not present

## 2016-05-08 DIAGNOSIS — K858 Other acute pancreatitis without necrosis or infection: Secondary | ICD-10-CM

## 2016-05-08 DIAGNOSIS — K859 Acute pancreatitis without necrosis or infection, unspecified: Secondary | ICD-10-CM | POA: Diagnosis not present

## 2016-05-08 DIAGNOSIS — R935 Abnormal findings on diagnostic imaging of other abdominal regions, including retroperitoneum: Secondary | ICD-10-CM | POA: Diagnosis not present

## 2016-05-08 DIAGNOSIS — K760 Fatty (change of) liver, not elsewhere classified: Secondary | ICD-10-CM | POA: Diagnosis not present

## 2016-05-08 MED ORDER — GADOBENATE DIMEGLUMINE 529 MG/ML IV SOLN
20.0000 mL | Freq: Once | INTRAVENOUS | Status: AC | PRN
  Administered 2016-05-08: 17 mL via INTRAVENOUS

## 2016-06-07 DIAGNOSIS — L03119 Cellulitis of unspecified part of limb: Secondary | ICD-10-CM | POA: Diagnosis not present

## 2016-06-07 DIAGNOSIS — I119 Hypertensive heart disease without heart failure: Secondary | ICD-10-CM | POA: Diagnosis not present

## 2016-06-07 DIAGNOSIS — Z23 Encounter for immunization: Secondary | ICD-10-CM | POA: Diagnosis not present

## 2016-06-08 ENCOUNTER — Ambulatory Visit (HOSPITAL_BASED_OUTPATIENT_CLINIC_OR_DEPARTMENT_OTHER): Payer: Medicare Other | Admitting: Hematology and Oncology

## 2016-06-08 ENCOUNTER — Encounter: Payer: Self-pay | Admitting: Hematology and Oncology

## 2016-06-08 DIAGNOSIS — C50412 Malignant neoplasm of upper-outer quadrant of left female breast: Secondary | ICD-10-CM

## 2016-06-08 DIAGNOSIS — D0512 Intraductal carcinoma in situ of left breast: Secondary | ICD-10-CM | POA: Diagnosis not present

## 2016-06-08 DIAGNOSIS — Z79810 Long term (current) use of selective estrogen receptor modulators (SERMs): Secondary | ICD-10-CM

## 2016-06-08 DIAGNOSIS — Z170 Estrogen receptor positive status [ER+]: Secondary | ICD-10-CM

## 2016-06-08 NOTE — Assessment & Plan Note (Signed)
Left lumpectomy 01/04/2015: DCIS 3.3 cm, grade 2, ER 100%, PR 0%, margins were close underwent reexcision margins negative, repeat mammogram showed calcifications and biopsy showed DCIS  left mastectomy with immediate reconstruction 04/07/2015: DCIS with calcifications, ALH, LCIS, 0/2 lymph nodes negative, grade 2, tumor size 3 cm, margins 3 mm, ER 100%, PR 10% Tis N0 stage 0  Recommendation: 1. Adjuvant tamoxifen 20 mg daily 5 years started mid September 2016.  D&C uterus: Endometrial polyp 10/31/2015  Tamoxifen Toxicities: very occasional night sweats otherwise doing well Hospitalization 03/29/2016 to 04/01/2016: Cellulitis of the chest wall, possible shingles Hospitalization 03/02/2016 to 03/06/2016: Sepsis secondary to acute pancreatitis with early pseudocyst formation   Breast Cancer Surveillance: 1. Breast exam 06/08/2016: Normal 2. Mammogram 12/05/2015 on the right breast No abnormalities. Postsurgical changes. Breast Density Category C. I recommended that she get 3-D mammograms for surveillance. Discussed the differences between different breast density categories.    Survivorship: Discussed the importance of physical exercise in decreasing the likelihood of breast cancer recurrence. Recommended 30 mins daily 6 days a week of either brisk walking or cycling or swimming. Encouraged patient to eat more fruits and vegetables and decrease red meat.   Return to clinic 6 months for follow up Leg swelling: We will get an ultrasound of the leg to evaluate for DVT.

## 2016-06-08 NOTE — Progress Notes (Signed)
Patient Care Team: Levin Erp, MD as PCP - General (Internal Medicine) Fanny Skates, MD as Consulting Physician (General Surgery) Nicholas Lose, MD as Consulting Physician (Hematology and Oncology) Arloa Koh, MD as Consulting Physician (Radiation Oncology) Crissie Reese, MD as Consulting Physician (Plastic Surgery) Sylvan Cheese, NP as Nurse Practitioner (Hematology and Oncology)  DIAGNOSIS: History of Breast cancer of upper-outer quadrant of left female breast s/p breast reconstruction   Staging form: Breast, AJCC 7th Edition   - Clinical: Stage 0 (Tis (DCIS), N0, M0) - Unsigned   - Pathologic stage from 04/07/2015: Stage 0 (Tis (DCIS), N0, cM0) - Unsigned  SUMMARY OF ONCOLOGIC HISTORY:   History of Breast cancer of upper-outer quadrant of left female breast s/p breast reconstruction   12/01/2014 Mammogram    Left breast: new calcifications warranting further imaging      12/08/2014 Initial Biopsy    Left breast core needle bx (UOQ): DCIS with calcifications, high grade, ER+ (100%), PR- (0%).      12/13/2014 Breast MRI    Left breast patchy diffuse multifocal enhancement with postbiopsy changes      01/04/2015 Surgery    Left lumpectomy Dalbert Batman): DCIS, 3.3 cm, grade 2, ER+ (100%), PR- (0%), margins were close      01/12/2015 Surgery    Re-excision of left margin: close      02/15/2015 Mammogram    Left breast: residual calcifications similar in appearance to the previously biopsied malignant calcifications located along the anterior inferior margin and extending for approximately 3 cm inferior and anterior to the anterior portion of the seroma      03/01/2015 Procedure    Left breast core needle bx: DCIS with calcifications: ER+ (100%), PR+ (10%), grade 2      03/01/2015 Clinical Stage    Stage 0: Tis N0      04/07/2015 Definitive Surgery    Left mastectomy/SLNB Dalbert Batman) with placement of saline implant and reconstruction with acellular dermal matrix  Harlow Mares):  DCIS with calcifications, ALH, LCIS, 0/2 lymph nodes negative, grade 2, tumor size 3 cm, margins 3 mm, ER+ (100%), PR+ (10%)       04/07/2015 Pathologic Stage    Stage 0: Tis N0      05/24/2015 -  Anti-estrogen oral therapy     tamoxifen 20 mg daily 5 years      08/26/2015 Survivorship    Survivorship care plan completed and mailed to patient in lieu of in person visit at her request       Breast cancer, left breast ([place])   04/07/2015 Initial Diagnosis    Breast cancer, left breast ([place])       CHIEF COMPLIANT: Follow-up on adjuvant tamoxifen  INTERVAL HISTORY: [name] is a 65 year old with above-mentioned history of left mastectomy for DCIS and she is currently on adjuvant tamoxifen. She is tolerating tamoxifen extremely well. She denies any hot flashes or myalgias. She denies any lumps or nodules in the chest wall or axilla. She complains of very occasional night sweats. She was hospitalized recently for pancreatic pseudocyst as well as cellulitis of the chest wall. She is recovered from both of these.  REVIEW OF SYSTEMS:   Constitutional: Denies fevers, chills or abnormal weight loss Eyes: Denies blurriness of vision Ears, nose, mouth, throat, and face: Denies mucositis or sore throat Respiratory: Denies cough, dyspnea or wheezes Cardiovascular: Denies palpitation, chest discomfort Gastrointestinal:  Denies nausea, heartburn or change in bowel habits Skin: Denies abnormal skin rashes Lymphatics: Denies new lymphadenopathy  or easy bruising Neurological:Denies numbness, tingling or new weaknesses Behavioral/Psych: Mood is stable, no new changes  Extremities: No lower extremity edema Breast:  denies any pain or lumps or nodules in either breasts All other systems were reviewed with the patient and are negative.  I have reviewed the past medical history, past surgical history, social history and family history with the patient and they are unchanged from previous  note.  ALLERGIES:  is allergic to other.  MEDICATIONS:  Current Outpatient Prescriptions  Medication Sig Dispense Refill  . amLODipine (NORVASC) 5 MG tablet Take 1 tablet (5 mg total) by mouth daily. 30 tablet 0  . aspirin 81 MG chewable tablet Chew 81 mg by mouth daily.    . Biotin 5000 MCG TABS Take 1 tablet by mouth daily.    . Calcium Carbonate-Vitamin D (CALCIUM 600 + D PO) Take 2 tablets by mouth daily.      . chlorhexidine (PERIDEX) 0.12 % solution As directed    . Cholecalciferol (VITAMIN D) 2000 units tablet Take 2,000 Units by mouth daily.    Marland Kitchen esomeprazole (NEXIUM) 20 MG capsule Take 20 mg by mouth daily as needed (For heartburn.).     Marland Kitchen levothyroxine (SYNTHROID, LEVOTHROID) 50 MCG tablet Take 50 mcg by mouth daily.    . Multiple Vitamins-Minerals (CENTRUM SILVER PO) Take 1 tablet by mouth daily.     Marland Kitchen POTASSIUM PO Take 1 tablet by mouth daily.    . Probiotic CAPS Take 1 capsule by mouth every morning.    . tamoxifen (NOLVADEX) 20 MG tablet Take 1 tablet (20 mg total) by mouth daily. 90 tablet 3   No current facility-administered medications for this visit.     PHYSICAL EXAMINATION: ECOG PERFORMANCE STATUS: 1 - Symptomatic but completely ambulatory  Vitals:   06/08/16 1017  BP: (!) 147/82  Pulse: 64  Resp: 18  Temp: 97.5 F (36.4 C)   Filed Weights   06/08/16 1017  Weight: 192 lb 8 oz (87.3 kg)    GENERAL:alert, no distress and comfortable SKIN: skin color, texture, turgor are normal, no rashes or significant lesions EYES: normal, Conjunctiva are pink and non-injected, sclera clear OROPHARYNX:no exudate, no erythema and lips, buccal mucosa, and tongue normal  NECK: supple, thyroid normal size, non-tender, without nodularity LYMPH:  no palpable lymphadenopathy in the cervical, axillary or inguinal LUNGS: clear to auscultation and percussion with normal breathing effort HEART: regular rate & rhythm and no murmurs and no lower extremity edema ABDOMEN:abdomen  soft, non-tender and normal bowel sounds MUSCULOSKELETAL:no cyanosis of digits and no clubbing  NEURO: alert & oriented x 3 with fluent speech, no focal motor/sensory deficits EXTREMITIES: No lower extremity edema BREAST: No palpable masses or nodules in either right or left breasts. No palpable axillary supraclavicular or infraclavicular adenopathy no breast tenderness or nipple discharge. (exam performed in the presence of a chaperone)  LABORATORY DATA:  I have reviewed the data as listed   Chemistry      Component Value Date/Time   NA 141 03/31/2016 0611   K 4.0 03/31/2016 0611   CL 112 (H) 03/31/2016 0611   CO2 24 03/31/2016 0611   BUN 6 03/31/2016 0611   CREATININE 0.90 03/31/2016 0611      Component Value Date/Time   CALCIUM 8.7 (L) 03/31/2016 0611   ALKPHOS 63 03/30/2016 0347   AST 62 (H) 03/30/2016 0347   ALT 51 03/30/2016 0347   BILITOT 0.8 03/30/2016 0347       Lab Results  Component Value Date   WBC 7.0 03/31/2016   HGB 11.1 (L) 03/31/2016   HCT 35.1 (L) 03/31/2016   MCV 95.9 03/31/2016   PLT 202 03/31/2016   NEUTROABS 8.2 (H) 03/29/2016     ASSESSMENT & PLAN:  History of Breast cancer of upper-outer quadrant of left female breast s/p breast reconstruction Left lumpectomy 01/04/2015: DCIS 3.3 cm, grade 2, ER 100%, PR 0%, margins were close underwent reexcision margins negative, repeat mammogram showed calcifications and biopsy showed DCIS  left mastectomy with immediate reconstruction 04/07/2015: DCIS with calcifications, ALH, LCIS, 0/2 lymph nodes negative, grade 2, tumor size 3 cm, margins 3 mm, ER 100%, PR 10% Tis N0 stage 0  Recommendation: 1. Adjuvant tamoxifen 20 mg daily 5 years started mid September 2016.  D&C uterus: Endometrial polyp 10/31/2015  Tamoxifen Toxicities: very occasional night sweats otherwise doing well Hospitalization 03/29/2016 to 04/01/2016: Cellulitis of the chest wall, possible shingles Hospitalization 03/02/2016 to  03/06/2016: Sepsis secondary to acute pancreatitis with early pseudocyst formation   Breast Cancer Surveillance: 1. Breast exam 06/08/2016: Normal 2. Mammogram 12/05/2015 on the right breast No abnormalities. Postsurgical changes. Breast Density Category C. I recommended that she get 3-D mammograms for surveillance. Discussed the differences between different breast density categories.    Survivorship: Discussed the importance of physical exercise in decreasing the likelihood of breast cancer recurrence. Recommended 30 mins daily 6 days a week of either brisk walking or cycling or swimming. Encouraged patient to eat more fruits and vegetables and decrease red meat.   Return to clinic 1 yr for follow up  No orders of the defined types were placed in this encounter.  The patient has a good understanding of the overall plan. she agrees with it. she will call with any problems that may develop before the next visit here.   Rulon Eisenmenger, MD 06/08/16

## 2016-08-02 DIAGNOSIS — H25041 Posterior subcapsular polar age-related cataract, right eye: Secondary | ICD-10-CM | POA: Diagnosis not present

## 2016-08-02 DIAGNOSIS — H5213 Myopia, bilateral: Secondary | ICD-10-CM | POA: Diagnosis not present

## 2016-08-02 DIAGNOSIS — Z961 Presence of intraocular lens: Secondary | ICD-10-CM | POA: Diagnosis not present

## 2016-08-02 DIAGNOSIS — H2511 Age-related nuclear cataract, right eye: Secondary | ICD-10-CM | POA: Diagnosis not present

## 2016-08-09 ENCOUNTER — Other Ambulatory Visit: Payer: Self-pay | Admitting: Obstetrics & Gynecology

## 2016-08-09 DIAGNOSIS — N8500 Endometrial hyperplasia, unspecified: Secondary | ICD-10-CM | POA: Diagnosis not present

## 2016-08-09 DIAGNOSIS — N898 Other specified noninflammatory disorders of vagina: Secondary | ICD-10-CM | POA: Diagnosis not present

## 2016-08-15 DIAGNOSIS — H25041 Posterior subcapsular polar age-related cataract, right eye: Secondary | ICD-10-CM | POA: Diagnosis not present

## 2016-08-15 DIAGNOSIS — H2511 Age-related nuclear cataract, right eye: Secondary | ICD-10-CM | POA: Diagnosis not present

## 2016-11-14 ENCOUNTER — Other Ambulatory Visit: Payer: Self-pay | Admitting: Internal Medicine

## 2016-11-14 DIAGNOSIS — Z1231 Encounter for screening mammogram for malignant neoplasm of breast: Secondary | ICD-10-CM

## 2016-11-14 DIAGNOSIS — Z9012 Acquired absence of left breast and nipple: Secondary | ICD-10-CM

## 2016-12-08 IMAGING — MR MR ABDOMEN WO/W CM MRCP
10 of 23 series · 20 of 48 positions shown · IV contrast (multihance)
Comparison: 03/02/2016 CT

CLINICAL DATA: Acute pancreatitis.

EXAM:
MRI ABDOMEN WITHOUT AND WITH CONTRAST (INCLUDING MRCP)
TECHNIQUE: Multiplanar multisequence MR imaging of the abdomen was performed
both before and after the administration of intravenous contrast.
Heavily T2-weighted images of the biliary and pancreatic ducts were
obtained, and three-dimensional MRCP images were rendered by post
processing.
CONTRAST:  17mL MULTIHANCE GADOBENATE DIMEGLUMINE 529 MG/ML IV SOLN

[Series 3: T2 fat-sat · axial · 5.0mm · 0.86mm/px · z∈[-16,+239]mm · 2 of 52 slices shown]
[im 1/52]
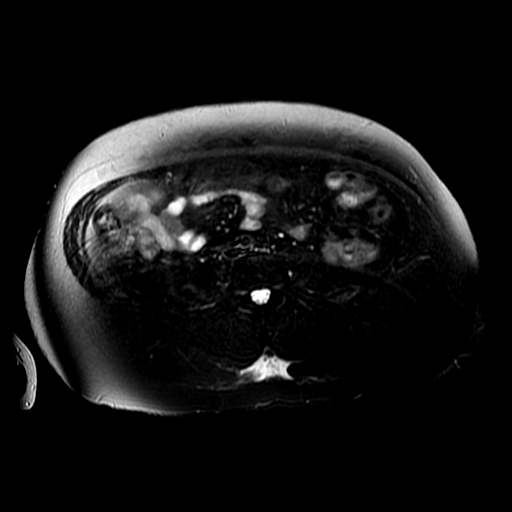
[im 52/52]
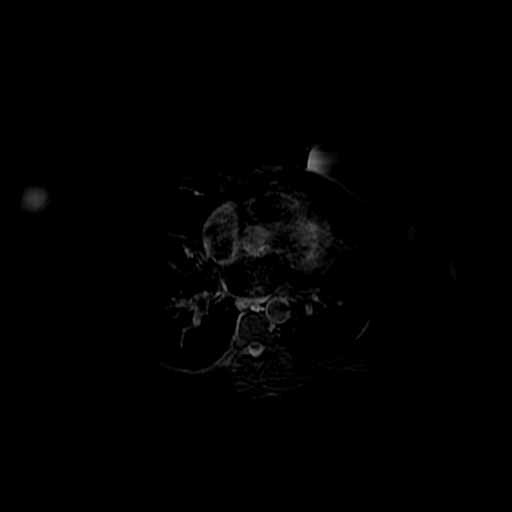

[Series 5: MRCP · coronal · 2.0mm · 0.70mm/px · 3 of 58 slices shown (1 of 2)]
[im 1/58]
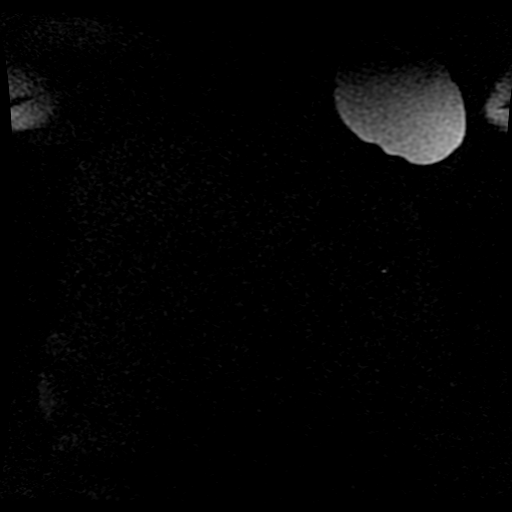
[im 29/58]
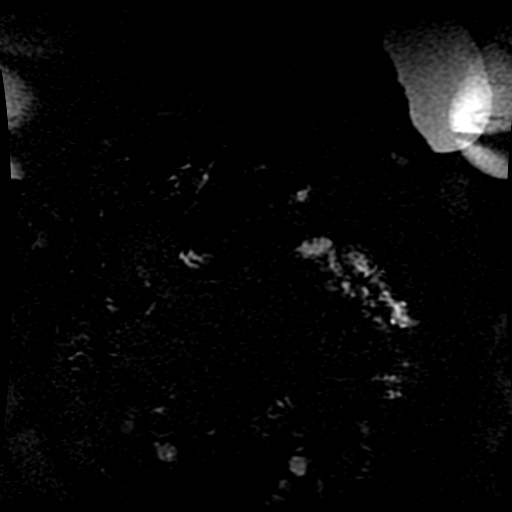
[im 58/58]
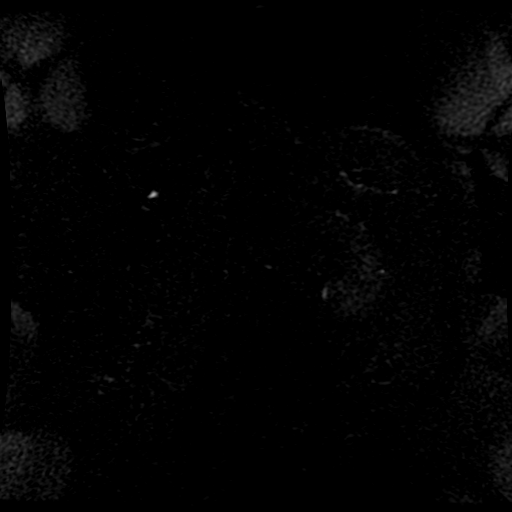

[Series 6: DWI b500 · axial · 6.0mm · 1.64mm/px · z∈[-21,+244]mm · 2 of 70 slices shown]
[im 1/70]
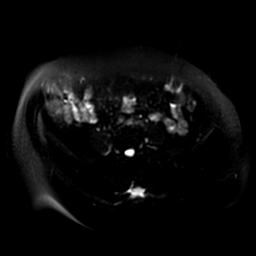
[im 70/70]
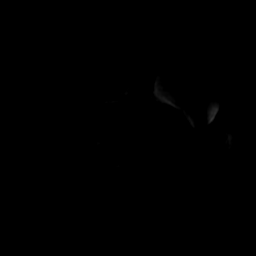

[Series 7: ax dualecho · axial · 5.0mm · 0.86mm/px · z∈[-16,+239]mm · 3 of 104 slices shown]
[im 1/104]
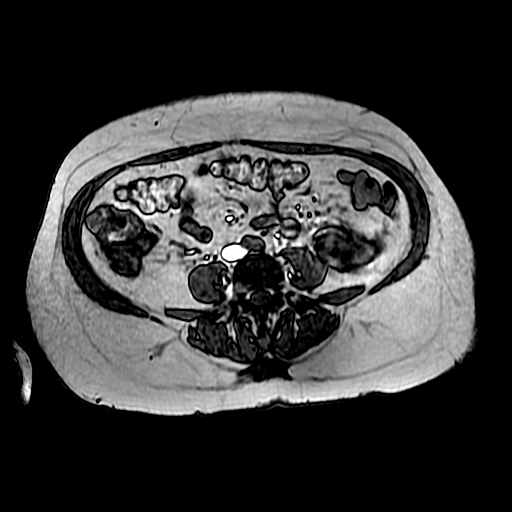
[im 52/104]
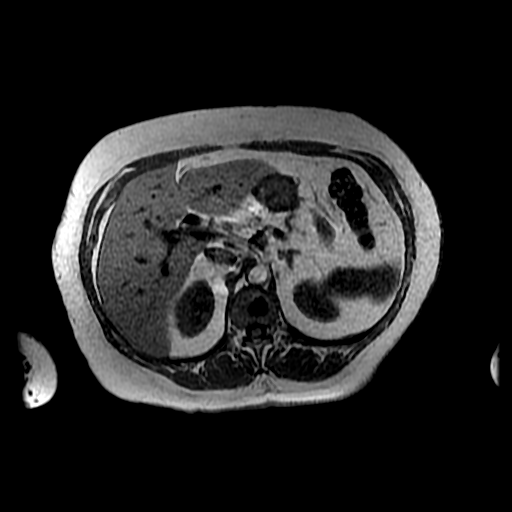
[im 104/104]
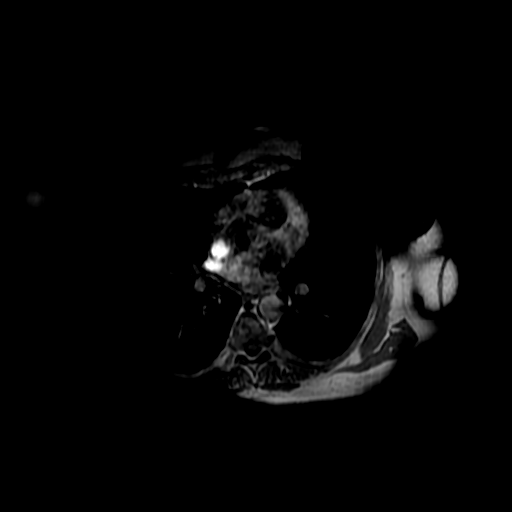

[Series 8: MRCP · coronal · 40.0mm · 0.70mm/px · 1 of 9 slices shown (2 of 2)]
[im 1/9]
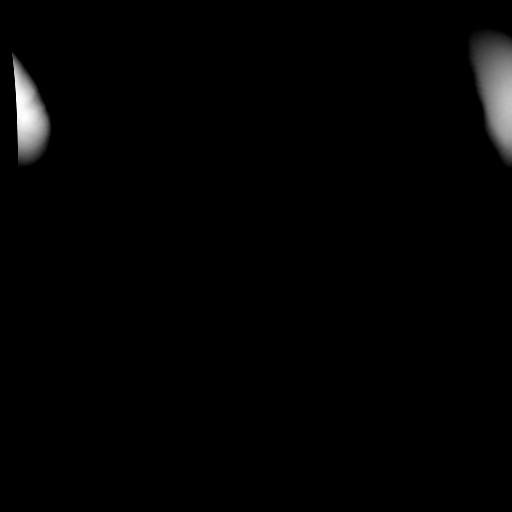

[Series 9: bSSFP fat-sat · coronal · 5.0mm · 0.70mm/px · 1 of 41 slices shown]
[im 1/41]
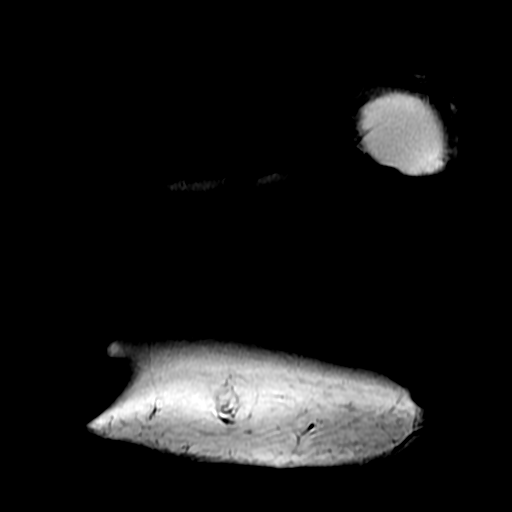

[Series 10: T2 · axial · 5.0mm · 0.86mm/px · 1 of 52 slices shown]
[im 1/52]
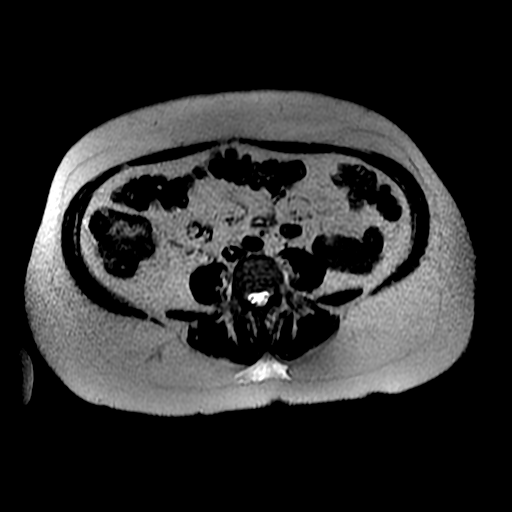

[Series 12: T1 dynamic · coronal · delayed · 8.0mm · 0.78mm/px · 2 of 80 slices shown]
[im 1/80]
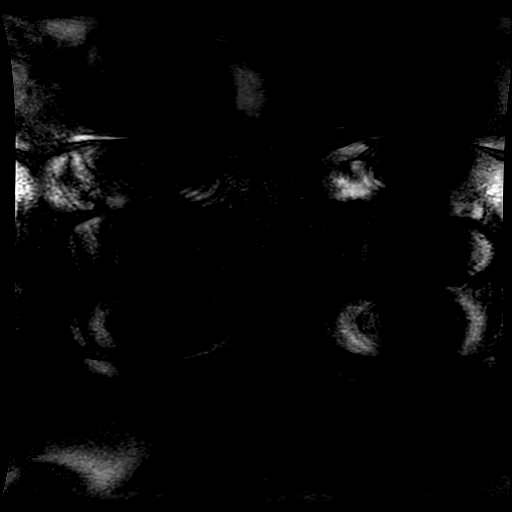
[im 80/80]
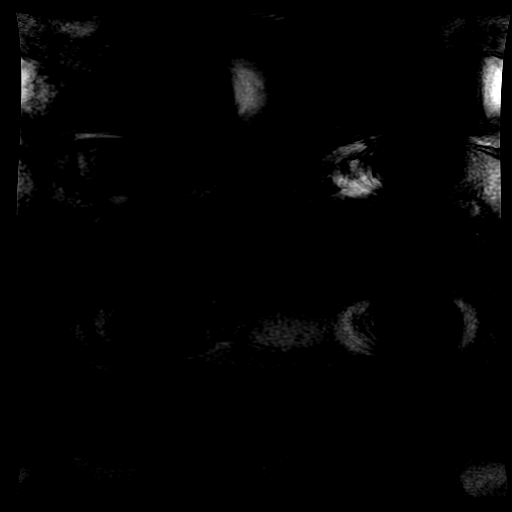

[Series 400: reformatted · axial · 2.0mm · 0.62mm/px · z∈[+116,+246]mm · 2 of 85 slices shown (1 of 2)]
[im 1/85]
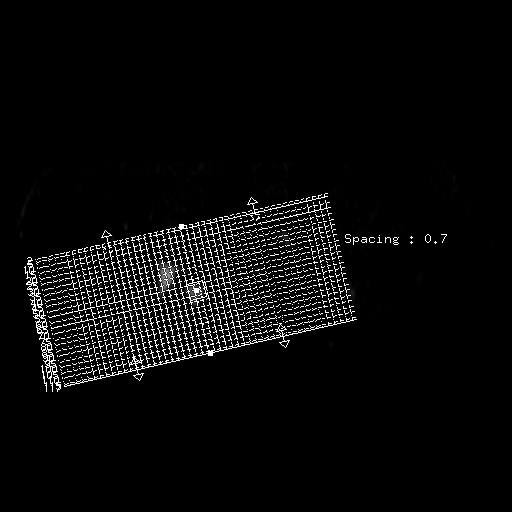
[im 85/85]
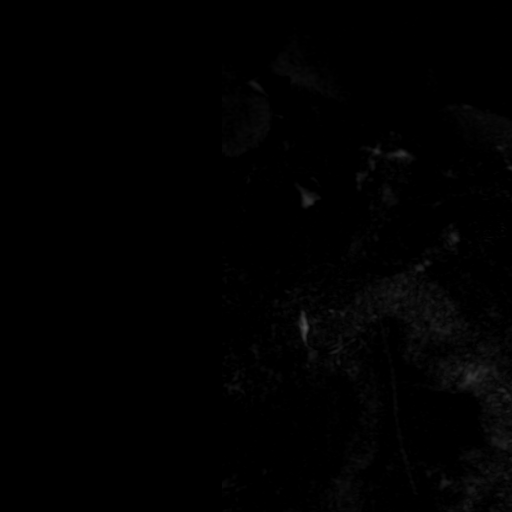

[Series 401: reformatted · coronal · 100.0mm · 0.51mm/px · 3 of 159 slices shown (2 of 2)]
[im 1/159]
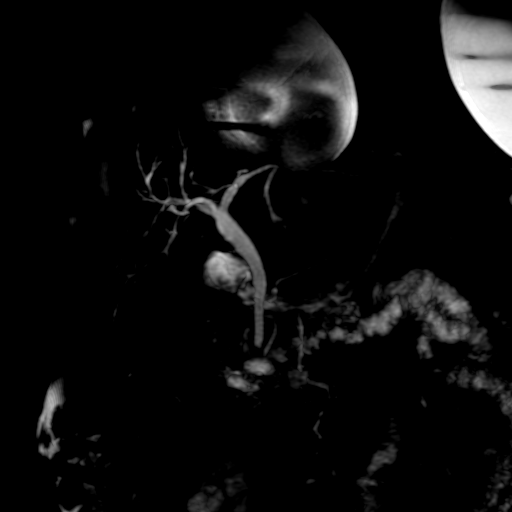
[im 53/159]
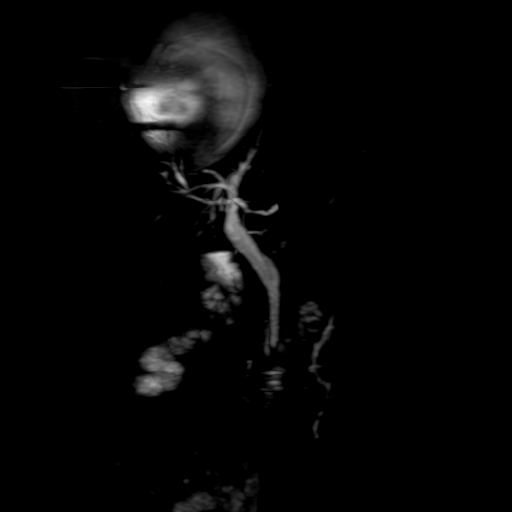
[im 106/159]
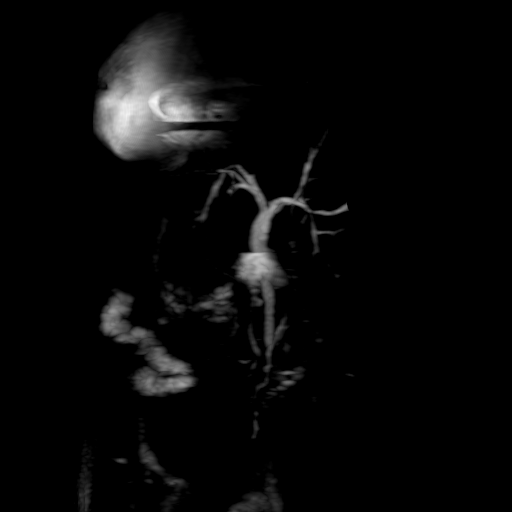

[20 of 48 positions shown; findings below may reference images not displayed]

FINDINGS: Lower chest: Normal heart size without pericardial or pleural
effusion. Left-sided breast implant.

Hepatobiliary: Mild hepatic steatosis. No focal liver lesion.
Cholecystectomy. Common duct is upper normal after cholecystectomy,
including at 10 mm on image 39/ series 5. No choledocholithiasis.

Pancreas: The distal pancreatic body and tail are congenitally
absent or less likely markedly atrophic. This is similar to on the
prior exam. There is no evidence of residual acute pancreatitis. No
pancreatic duct dilatation. No evidence of pancreas divisum. No
peripancreatic fluid collection. Note is made of periampullary and
transverse duodenal diverticulum.

Spleen: Normal in size, without focal abnormality.

Adrenals/Urinary Tract: Normal adrenal glands. Normal kidneys,
without hydronephrosis.

Stomach/Bowel: Normal stomach and abdominal bowel loops (other than
duodenal diverticula).

Vascular/Lymphatic: Aortic and branch vessel atherosclerosis. No
retroperitoneal or retrocrural adenopathy.

Other: No ascites.

Musculoskeletal: No acute osseous abnormality.
IMPRESSION: 1. Resolution of acute pancreatitis. Probable congenital absence of
pancreatic tail and upstream body versus less likely marked atrophy.
This is unchanged.
2. No pancreas divisum, choledocholithiasis, or other explanation
for pancreatitis.
3. Mild hepatic steatosis.
4.  Aortic atherosclerosis.

## 2016-12-10 ENCOUNTER — Ambulatory Visit
Admission: RE | Admit: 2016-12-10 | Discharge: 2016-12-10 | Disposition: A | Discharge status: Home / Self Care | Payer: Medicare Other | Source: Ambulatory Visit | Attending: Internal Medicine | Admitting: Internal Medicine

## 2016-12-10 DIAGNOSIS — Z1231 Encounter for screening mammogram for malignant neoplasm of breast: Secondary | ICD-10-CM

## 2016-12-10 DIAGNOSIS — Z9012 Acquired absence of left breast and nipple: Secondary | ICD-10-CM

## 2017-05-20 ENCOUNTER — Other Ambulatory Visit: Payer: Self-pay | Admitting: Internal Medicine

## 2017-05-20 DIAGNOSIS — M8580 Other specified disorders of bone density and structure, unspecified site: Secondary | ICD-10-CM

## 2017-06-07 ENCOUNTER — Ambulatory Visit
Admission: RE | Admit: 2017-06-07 | Discharge: 2017-06-07 | Disposition: A | Discharge status: Home / Self Care | Payer: Medicare Other | Source: Ambulatory Visit | Attending: Internal Medicine | Admitting: Internal Medicine

## 2017-06-07 DIAGNOSIS — M8580 Other specified disorders of bone density and structure, unspecified site: Secondary | ICD-10-CM

## 2017-06-10 ENCOUNTER — Telehealth: Payer: Self-pay | Admitting: Hematology and Oncology

## 2017-06-10 ENCOUNTER — Ambulatory Visit (HOSPITAL_BASED_OUTPATIENT_CLINIC_OR_DEPARTMENT_OTHER): Payer: Medicare Other | Admitting: Hematology and Oncology

## 2017-06-10 DIAGNOSIS — D0512 Intraductal carcinoma in situ of left breast: Secondary | ICD-10-CM | POA: Diagnosis present

## 2017-06-10 DIAGNOSIS — Z170 Estrogen receptor positive status [ER+]: Secondary | ICD-10-CM

## 2017-06-10 DIAGNOSIS — C50412 Malignant neoplasm of upper-outer quadrant of left female breast: Secondary | ICD-10-CM

## 2017-06-10 DIAGNOSIS — Z79810 Long term (current) use of selective estrogen receptor modulators (SERMs): Secondary | ICD-10-CM

## 2017-06-10 MED ORDER — TAMOXIFEN CITRATE 20 MG PO TABS: 20.0000 mg | ORAL_TABLET | Freq: Every day | ORAL | 3 refills | Status: DC

## 2017-06-10 NOTE — Progress Notes (Signed)
Patient Care Team: Levin Erp, MD as PCP - General (Internal Medicine) Fanny Skates, MD as Consulting Physician (General Surgery) Nicholas Lose, MD as Consulting Physician (Hematology and Oncology) Arloa Koh, MD as Consulting Physician (Radiation Oncology) Crissie Reese, MD as Consulting Physician (Plastic Surgery) Sylvan Cheese, NP as Nurse Practitioner (Hematology and Oncology)  DIAGNOSIS: No diagnosis found.  SUMMARY OF ONCOLOGIC HISTORY:   History of Breast cancer of upper-outer quadrant of left female breast s/p breast reconstruction   12/01/2014 Mammogram    Left breast: new calcifications warranting further imaging      12/08/2014 Initial Biopsy    Left breast core needle bx (UOQ): DCIS with calcifications, high grade, ER+ (100%), PR- (0%).      12/13/2014 Breast MRI    Left breast patchy diffuse multifocal enhancement with postbiopsy changes      01/04/2015 Surgery    Left lumpectomy Dalbert Batman): DCIS, 3.3 cm, grade 2, ER+ (100%), PR- (0%), margins were close      01/12/2015 Surgery    Re-excision of left margin: close      02/15/2015 Mammogram    Left breast: residual calcifications similar in appearance to the previously biopsied malignant calcifications located along the anterior inferior margin and extending for approximately 3 cm inferior and anterior to the anterior portion of the seroma      03/01/2015 Procedure    Left breast core needle bx: DCIS with calcifications: ER+ (100%), PR+ (10%), grade 2      03/01/2015 Clinical Stage    Stage 0: Tis N0      04/07/2015 Definitive Surgery    Left mastectomy/SLNB Dalbert Batman) with placement of saline implant and reconstruction with acellular dermal matrix  Harlow Mares): DCIS with calcifications, ALH, LCIS, 0/2 lymph nodes negative, grade 2, tumor size 3 cm, margins 3 mm, ER+ (100%), PR+ (10%)       04/07/2015 Pathologic Stage    Stage 0: Tis N0      05/24/2015 -  Anti-estrogen oral therapy     tamoxifen 20  mg daily 5 years      08/26/2015 Survivorship    Survivorship care plan completed and mailed to patient in lieu of in person visit at her request       Breast cancer, left breast ([place])   04/07/2015 Initial Diagnosis    Breast cancer, left breast ([place])       CHIEF COMPLIANT: Follow-up on tamoxifen therapy  INTERVAL HISTORY: [name] is a 66 year old with above-mentioned history of left breast DCIS treated with mastectomy and is currently on tamoxifen since September 2016. She is tolerating tamoxifen extremely well. She does complain of intermittent hot flashes. She has had no further problems with cellulitis of the breast. She denies any lumps or nodules in the breast or chest wall or axilla  REVIEW OF SYSTEMS:   Constitutional: Denies fevers, chills or abnormal weight loss Eyes: Denies blurriness of vision Ears, nose, mouth, throat, and face: Denies mucositis or sore throat Respiratory: Denies cough, dyspnea or wheezes Cardiovascular: Denies palpitation, chest discomfort Gastrointestinal:  Denies nausea, heartburn or change in bowel habits Skin: Denies abnormal skin rashes Lymphatics: Denies new lymphadenopathy or easy bruising Neurological:Denies numbness, tingling or new weaknesses Behavioral/Psych: Mood is stable, no new changes  Extremities: No lower extremity edema Breast:  denies any pain or lumps or nodules in either breasts All other systems were reviewed with the patient and are negative.  I have reviewed the past medical history, past surgical history, social history and  family history with the patient and they are unchanged from previous note.  ALLERGIES:  is allergic to other.  MEDICATIONS:  Current Outpatient Prescriptions  Medication Sig Dispense Refill  . amLODipine (NORVASC) 5 MG tablet Take 1 tablet (5 mg total) by mouth daily. 30 tablet 0  . aspirin 81 MG chewable tablet Chew 81 mg by mouth daily.    . Biotin 5000 MCG TABS Take 1 tablet by mouth  daily.    . Calcium Carbonate-Vitamin D (CALCIUM 600 + D PO) Take 2 tablets by mouth daily.      . chlorhexidine (PERIDEX) 0.12 % solution As directed    . Cholecalciferol (VITAMIN D) 2000 units tablet Take 2,000 Units by mouth daily.    Marland Kitchen esomeprazole (NEXIUM) 20 MG capsule Take 20 mg by mouth daily as needed (For heartburn.).     Marland Kitchen levothyroxine (SYNTHROID, LEVOTHROID) 50 MCG tablet Take 50 mcg by mouth daily.    . Multiple Vitamins-Minerals (CENTRUM SILVER PO) Take 1 tablet by mouth daily.     Marland Kitchen POTASSIUM PO Take 1 tablet by mouth daily.    . Probiotic CAPS Take 1 capsule by mouth every morning.    . tamoxifen (NOLVADEX) 20 MG tablet Take 1 tablet (20 mg total) by mouth daily. 90 tablet 3   No current facility-administered medications for this visit.     PHYSICAL EXAMINATION: ECOG PERFORMANCE STATUS: 0 - Asymptomatic  Vitals:   06/10/17 1103  BP: 129/87  Pulse: 71  Resp: 18  Temp: 98.3 F (36.8 C)  SpO2: 98%   Filed Weights   06/10/17 1103  Weight: 198 lb 3.2 oz (89.9 kg)    GENERAL:alert, no distress and comfortable SKIN: skin color, texture, turgor are normal, no rashes or significant lesions EYES: normal, Conjunctiva are pink and non-injected, sclera clear OROPHARYNX:no exudate, no erythema and lips, buccal mucosa, and tongue normal  NECK: supple, thyroid normal size, non-tender, without nodularity LYMPH:  no palpable lymphadenopathy in the cervical, axillary or inguinal LUNGS: clear to auscultation and percussion with normal breathing effort HEART: regular rate & rhythm and no murmurs and no lower extremity edema ABDOMEN:abdomen soft, non-tender and normal bowel sounds MUSCULOSKELETAL:no cyanosis of digits and no clubbing  NEURO: alert & oriented x 3 with fluent speech, no focal motor/sensory deficits EXTREMITIES: No lower extremity edema BREAST: No palpable masses or nodules in either right or left breasts. Left breast reconstruction appears to be intact without any  abnormalities. No palpable axillary supraclavicular or infraclavicular adenopathy no breast tenderness or nipple discharge. (exam performed in the presence of a chaperone)  LABORATORY DATA:  I have reviewed the data as listed   Chemistry      Component Value Date/Time   NA 141 03/31/2016 0611   K 4.0 03/31/2016 0611   CL 112 (H) 03/31/2016 0611   CO2 24 03/31/2016 0611   BUN 6 03/31/2016 0611   CREATININE 0.90 03/31/2016 0611      Component Value Date/Time   CALCIUM 8.7 (L) 03/31/2016 0611   ALKPHOS 63 03/30/2016 0347   AST 62 (H) 03/30/2016 0347   ALT 51 03/30/2016 0347   BILITOT 0.8 03/30/2016 0347       Lab Results  Component Value Date   WBC 7.0 03/31/2016   HGB 11.1 (L) 03/31/2016   HCT 35.1 (L) 03/31/2016   MCV 95.9 03/31/2016   PLT 202 03/31/2016   NEUTROABS 8.2 (H) 03/29/2016    ASSESSMENT & PLAN:  History of Breast cancer of upper-outer  quadrant of left female breast s/p breast reconstruction Left lumpectomy 01/04/2015: DCIS 3.3 cm, grade 2, ER 100%, PR 0%, margins were close underwent reexcision margins negative, repeat mammogram showed calcifications and biopsy showed DCIS  left mastectomy with immediate reconstruction 04/07/2015: DCIS with calcifications, ALH, LCIS, 0/2 lymph nodes negative, grade 2, tumor size 3 cm, margins 3 mm, ER 100%, PR 10% Tis N0 stage 0  Recommendation: 1. Adjuvant tamoxifen 20 mg daily 5 years started mid September 2016.  D&C uterus: Endometrial polyp 10/31/2015  Tamoxifen Toxicities: very occasional night sweats otherwise doing well Hospitalization 03/29/2016 to 04/01/2016: Cellulitis of the chest wall, possible shingles Hospitalization 03/02/2016 to 03/06/2016: Sepsis secondary to acute pancreatitis with early pseudocyst formation   Breast Cancer Surveillance: 1. Breast exam 06/10/2017: Normal 2. Mammogram 12/10/2016 on the right breast  Breast Density Category  B. I recommended that she get 3-D mammograms for  surveillance. Discussed the differences between different breast density categories.  Return to clinic in 1 year for follow-up I spent 15 minutes talking to the patient of which more than half was spent in counseling and coordination of care.  No orders of the defined types were placed in this encounter.  The patient has a good understanding of the overall plan. she agrees with it. she will call with any problems that may develop before the next visit here.   Rulon Eisenmenger, MD 06/10/17

## 2017-06-10 NOTE — Assessment & Plan Note (Signed)
Left lumpectomy 01/04/2015: DCIS 3.3 cm, grade 2, ER 100%, PR 0%, margins were close underwent reexcision margins negative, repeat mammogram showed calcifications and biopsy showed DCIS  left mastectomy with immediate reconstruction 04/07/2015: DCIS with calcifications, ALH, LCIS, 0/2 lymph nodes negative, grade 2, tumor size 3 cm, margins 3 mm, ER 100%, PR 10% Tis N0 stage 0  Recommendation: 1. Adjuvant tamoxifen 20 mg daily 5 years started mid September 2016.  D&C uterus: Endometrial polyp 10/31/2015  Tamoxifen Toxicities: very occasional night sweats otherwise doing well Hospitalization 03/29/2016 to 04/01/2016: Cellulitis of the chest wall, possible shingles Hospitalization 03/02/2016 to 03/06/2016: Sepsis secondary to acute pancreatitis with early pseudocyst formation   Breast Cancer Surveillance: 1. Breast exam 06/08/2016: Normal 2. Mammogram 12/10/2016 on the right breast  Breast Density Category  B. I recommended that she get 3-D mammograms for surveillance. Discussed the differences between different breast density categories.

## 2017-06-10 NOTE — Telephone Encounter (Signed)
Scheduled appts per 10/8 los. Gave patient avs and calendar.

## 2017-09-10 ENCOUNTER — Other Ambulatory Visit: Payer: Self-pay

## 2017-09-10 DIAGNOSIS — Z170 Estrogen receptor positive status [ER+]: Principal | ICD-10-CM

## 2017-09-10 DIAGNOSIS — C50412 Malignant neoplasm of upper-outer quadrant of left female breast: Secondary | ICD-10-CM

## 2017-09-10 MED ORDER — TAMOXIFEN CITRATE 20 MG PO TABS: 20.0000 mg | ORAL_TABLET | Freq: Every day | ORAL | 3 refills | Status: DC

## 2017-09-12 ENCOUNTER — Other Ambulatory Visit: Payer: Self-pay

## 2017-09-12 DIAGNOSIS — Z170 Estrogen receptor positive status [ER+]: Principal | ICD-10-CM

## 2017-09-12 DIAGNOSIS — C50412 Malignant neoplasm of upper-outer quadrant of left female breast: Secondary | ICD-10-CM

## 2017-09-18 ENCOUNTER — Other Ambulatory Visit: Payer: Self-pay

## 2017-09-18 DIAGNOSIS — Z170 Estrogen receptor positive status [ER+]: Principal | ICD-10-CM

## 2017-09-18 DIAGNOSIS — C50412 Malignant neoplasm of upper-outer quadrant of left female breast: Secondary | ICD-10-CM

## 2017-09-18 MED ORDER — TAMOXIFEN CITRATE 20 MG PO TABS: 20.0000 mg | ORAL_TABLET | Freq: Every day | ORAL | 3 refills | Status: DC

## 2017-11-07 ENCOUNTER — Other Ambulatory Visit: Payer: Self-pay | Admitting: Internal Medicine

## 2017-11-07 DIAGNOSIS — Z1231 Encounter for screening mammogram for malignant neoplasm of breast: Secondary | ICD-10-CM

## 2017-12-12 ENCOUNTER — Ambulatory Visit
Admission: RE | Admit: 2017-12-12 | Discharge: 2017-12-12 | Disposition: A | Discharge status: Home / Self Care | Payer: Medicare HMO | Source: Ambulatory Visit | Attending: Internal Medicine | Admitting: Internal Medicine

## 2017-12-12 DIAGNOSIS — Z1231 Encounter for screening mammogram for malignant neoplasm of breast: Secondary | ICD-10-CM

## 2018-06-16 ENCOUNTER — Inpatient Hospital Stay: Payer: Medicare HMO | Attending: Hematology and Oncology | Admitting: Hematology and Oncology

## 2018-06-16 ENCOUNTER — Telehealth: Payer: Self-pay | Admitting: Hematology and Oncology

## 2018-06-16 DIAGNOSIS — Z79810 Long term (current) use of selective estrogen receptor modulators (SERMs): Secondary | ICD-10-CM | POA: Diagnosis not present

## 2018-06-16 DIAGNOSIS — C50412 Malignant neoplasm of upper-outer quadrant of left female breast: Secondary | ICD-10-CM | POA: Diagnosis not present

## 2018-06-16 DIAGNOSIS — Z79899 Other long term (current) drug therapy: Secondary | ICD-10-CM | POA: Diagnosis not present

## 2018-06-16 DIAGNOSIS — Z170 Estrogen receptor positive status [ER+]: Secondary | ICD-10-CM | POA: Diagnosis not present

## 2018-06-16 DIAGNOSIS — Z7982 Long term (current) use of aspirin: Secondary | ICD-10-CM | POA: Diagnosis not present

## 2018-06-16 MED ORDER — TAMOXIFEN CITRATE 20 MG PO TABS: 20.0000 mg | ORAL_TABLET | Freq: Every day | ORAL | 3 refills | Status: DC

## 2018-06-16 NOTE — Telephone Encounter (Signed)
Gave avs and calendar ° °

## 2018-06-16 NOTE — Assessment & Plan Note (Signed)
Left lumpectomy 01/04/2015: DCIS 3.3 cm, grade 2, ER 100%, PR 0%, margins were close underwent reexcision margins negative, repeat mammogram showed calcifications and biopsy showed DCIS  left mastectomy with immediate reconstruction 04/07/2015: DCIS with calcifications, ALH, LCIS, 0/2 lymph nodes negative, grade 2, tumor size 3 cm, margins 3 mm, ER 100%, PR 10% Tis N0 stage 0  Recommendation: 1. Adjuvant tamoxifen 20 mg daily 5 years started mid September 2016.  D&C uterus: Endometrial polyp 10/31/2015  Tamoxifen Toxicities: very occasional night sweats otherwise doing well Hospitalization 03/29/2016 to07/30/2017: Cellulitis of the chest wall, possible shingles Hospitalization 03/02/2016 to07/12/2015: Sepsis secondary to acute pancreatitis with early pseudocyst formation   Breast Cancer Surveillance: 1. Breast exam 06/16/2018: Normal 2. Mammogram 12/13/2017 on the right breast Breast Density Category C  I sent a new prescription for tamoxifen. Return to clinic in 1 year for follow-up

## 2018-06-16 NOTE — Progress Notes (Signed)
Patient Care Team: Levin Erp, MD as PCP - General (Internal Medicine) Fanny Skates, MD as Consulting Physician (General Surgery) Nicholas Lose, MD as Consulting Physician (Hematology and Oncology) Arloa Koh, MD as Consulting Physician (Radiation Oncology) Crissie Reese, MD as Consulting Physician (Plastic Surgery) Sylvan Cheese, NP as Nurse Practitioner (Hematology and Oncology)  DIAGNOSIS:  Encounter Diagnosis  Name Primary?  . Malignant neoplasm of upper-outer quadrant of left breast in female, estrogen receptor positive ([place])     SUMMARY OF ONCOLOGIC HISTORY:   History of Breast cancer of upper-outer quadrant of left female breast s/p breast reconstruction   12/01/2014 Mammogram    Left breast: new calcifications warranting further imaging    12/08/2014 Initial Biopsy    Left breast core needle bx (UOQ): DCIS with calcifications, high grade, ER+ (100%), PR- (0%).    12/13/2014 Breast MRI    Left breast patchy diffuse multifocal enhancement with postbiopsy changes    01/04/2015 Surgery    Left lumpectomy Dalbert Batman): DCIS, 3.3 cm, grade 2, ER+ (100%), PR- (0%), margins were close    01/12/2015 Surgery    Re-excision of left margin: close    02/15/2015 Mammogram    Left breast: residual calcifications similar in appearance to the previously biopsied malignant calcifications located along the anterior inferior margin and extending for approximately 3 cm inferior and anterior to the anterior portion of the seroma    03/01/2015 Procedure    Left breast core needle bx: DCIS with calcifications: ER+ (100%), PR+ (10%), grade 2    03/01/2015 Clinical Stage    Stage 0: Tis N0    04/07/2015 Definitive Surgery    Left mastectomy/SLNB Dalbert Batman) with placement of saline implant and reconstruction with acellular dermal matrix  Harlow Mares): DCIS with calcifications, ALH, LCIS, 0/2 lymph nodes negative, grade 2, tumor size 3 cm, margins 3 mm, ER+ (100%), PR+ (10%)     04/07/2015  Pathologic Stage    Stage 0: Tis N0    05/24/2015 -  Anti-estrogen oral therapy     tamoxifen 20 mg daily 5 years    08/26/2015 Survivorship    Survivorship care plan completed and mailed to patient in lieu of in person visit at her request     Breast cancer, left breast ([place])   04/07/2015 Initial Diagnosis    Breast cancer, left breast ([place])     CHIEF COMPLIANT: Follow-up on tamoxifen therapy  INTERVAL HISTORY: [name] is a 67 year old with above-mentioned history of left breast DCIS is currently on tamoxifen therapy and appears to be tolerating it extremely well.  She does have occasional hot flashes but they are not bothering her.  Denies any arthralgias or myalgias.  Denies any pain lumps or nodules in the breast.  She had left mastectomy and reconstruction.  No problems with her reconstructed breast.  REVIEW OF SYSTEMS:   Constitutional: Denies fevers, chills or abnormal weight loss Eyes: Denies blurriness of vision Ears, nose, mouth, throat, and face: Denies mucositis or sore throat Respiratory: Denies cough, dyspnea or wheezes Cardiovascular: Denies palpitation, chest discomfort Gastrointestinal:  Denies nausea, heartburn or change in bowel habits Skin: Denies abnormal skin rashes Lymphatics: Denies new lymphadenopathy or easy bruising Neurological:Denies numbness, tingling or new weaknesses Behavioral/Psych: Mood is stable, no new changes  Extremities: No lower extremity edema Breast:  denies any pain or lumps or nodules   All other systems were reviewed with the patient and are negative.  I have reviewed the past medical history, past surgical history, social  history and family history with the patient and they are unchanged from previous note.  ALLERGIES:  is allergic to other.  MEDICATIONS:  Current Outpatient Medications  Medication Sig Dispense Refill  . aspirin 81 MG chewable tablet Chew 81 mg by mouth daily.    . Calcium Carbonate-Vitamin D (CALCIUM 600  + D PO) Take 2 tablets by mouth daily.      . chlorhexidine (PERIDEX) 0.12 % solution As directed    . Cholecalciferol (VITAMIN D) 2000 units tablet Take 2,000 Units by mouth daily.    Marland Kitchen levothyroxine (SYNTHROID, LEVOTHROID) 50 MCG tablet Take 50 mcg by mouth daily.    Marland Kitchen MAGNESIUM PO Take by mouth daily.    . Multiple Vitamins-Minerals (ANTIOXIDANT PO) Take by mouth daily.    . Multiple Vitamins-Minerals (CENTRUM SILVER PO) Take 1 tablet by mouth daily.     Marland Kitchen POTASSIUM CHLORIDE PO Take by mouth daily.    . Probiotic CAPS Take 1 capsule by mouth every morning.    . tamoxifen (NOLVADEX) 20 MG tablet Take 1 tablet (20 mg total) by mouth daily. 90 tablet 3  . UNABLE TO FIND daily. Med Name: "Hair and Skin Vitamin"     No current facility-administered medications for this visit.     PHYSICAL EXAMINATION: ECOG PERFORMANCE STATUS: 1 - Symptomatic but completely ambulatory  Vitals:   06/16/18 1110  BP: (!) 151/81  Pulse: 62  Resp: 17  Temp: 98.5 F (36.9 C)  SpO2: 99%   Filed Weights   06/16/18 1110  Weight: 191 lb 6.4 oz (86.8 kg)    GENERAL:alert, no distress and comfortable SKIN: skin color, texture, turgor are normal, no rashes or significant lesions EYES: normal, Conjunctiva are pink and non-injected, sclera clear OROPHARYNX:no exudate, no erythema and lips, buccal mucosa, and tongue normal  NECK: supple, thyroid normal size, non-tender, without nodularity LYMPH:  no palpable lymphadenopathy in the cervical, axillary or inguinal LUNGS: clear to auscultation and percussion with normal breathing effort HEART: regular rate & rhythm and no murmurs and no lower extremity edema ABDOMEN:abdomen soft, non-tender and normal bowel sounds MUSCULOSKELETAL:no cyanosis of digits and no clubbing  NEURO: alert & oriented x 3 with fluent speech, no focal motor/sensory deficits EXTREMITIES: No lower extremity edema BREAST: No palpable masses or nodules in either right or left breasts.  Left  breast has been reconstructed.  No palpable axillary supraclavicular or infraclavicular adenopathy no breast tenderness or nipple discharge. (exam performed in the presence of a chaperone)  LABORATORY DATA:  I have reviewed the data as listed CMP Latest Ref Rng & Units 03/31/2016 03/30/2016 03/29/2016  Glucose 65 - 99 mg/dL 118(H) 113(H) 101(H)  BUN 6 - 20 mg/dL 6 8 9   Creatinine 0.44 - 1.00 mg/dL 0.90 0.94 0.74  Sodium 135 - 145 mmol/L 141 142 140  Potassium 3.5 - 5.1 mmol/L 4.0 3.4(L) 4.0  Chloride 101 - 111 mmol/L 112(H) 107 108  CO2 22 - 32 mmol/L 24 26 24   Calcium 8.9 - 10.3 mg/dL 8.7(L) 8.5(L) 8.6(L)  Total Protein 6.5 - 8.1 g/dL - 5.4(L) 5.6(L)  Total Bilirubin 0.3 - 1.2 mg/dL - 0.8 0.8  Alkaline Phos 38 - 126 U/L - 63 56  AST 15 - 41 U/L - 62(H) 32  ALT 14 - 54 U/L - 51 35    Lab Results  Component Value Date   WBC 7.0 03/31/2016   HGB 11.1 (L) 03/31/2016   HCT 35.1 (L) 03/31/2016   MCV 95.9 03/31/2016  PLT 202 03/31/2016   NEUTROABS 8.2 (H) 03/29/2016    ASSESSMENT & PLAN:  History of Breast cancer of upper-outer quadrant of left female breast s/p breast reconstruction Left lumpectomy 01/04/2015: DCIS 3.3 cm, grade 2, ER 100%, PR 0%, margins were close underwent reexcision margins negative, repeat mammogram showed calcifications and biopsy showed DCIS  left mastectomy with immediate reconstruction 04/07/2015: DCIS with calcifications, ALH, LCIS, 0/2 lymph nodes negative, grade 2, tumor size 3 cm, margins 3 mm, ER 100%, PR 10% Tis N0 stage 0  Recommendation: 1. Adjuvant tamoxifen 20 mg daily 5 years started mid September 2016.  D&C uterus: Endometrial polyp 10/31/2015  Tamoxifen Toxicities: very occasional night sweats otherwise doing well Hospitalization 03/29/2016 to07/30/2017: Cellulitis of the chest wall, possible shingles Hospitalization 03/02/2016 to07/12/2015: Sepsis secondary to acute pancreatitis with early pseudocyst formation   Breast Cancer  Surveillance: 1. Breast exam 06/16/2018: Normal 2. Mammogram 12/13/2017 on the right breast Breast Density Category C  I sent a new prescription for tamoxifen. Return to clinic in 1 year for follow-up   No orders of the defined types were placed in this encounter.  The patient has a good understanding of the overall plan. she agrees with it. she will call with any problems that may develop before the next visit here.   Harriette Ohara, MD 06/16/18

## 2018-12-19 ENCOUNTER — Other Ambulatory Visit: Payer: Self-pay | Admitting: Internal Medicine

## 2018-12-19 DIAGNOSIS — Z1231 Encounter for screening mammogram for malignant neoplasm of breast: Secondary | ICD-10-CM

## 2019-02-23 ENCOUNTER — Ambulatory Visit
Admission: RE | Admit: 2019-02-23 | Discharge: 2019-02-23 | Disposition: A | Discharge status: Home / Self Care | Payer: Medicare HMO | Source: Ambulatory Visit | Attending: Internal Medicine | Admitting: Internal Medicine

## 2019-02-23 DIAGNOSIS — Z1231 Encounter for screening mammogram for malignant neoplasm of breast: Secondary | ICD-10-CM

## 2019-05-05 ENCOUNTER — Other Ambulatory Visit: Payer: Self-pay | Admitting: Internal Medicine

## 2019-05-05 DIAGNOSIS — M8580 Other specified disorders of bone density and structure, unspecified site: Secondary | ICD-10-CM

## 2019-05-25 ENCOUNTER — Telehealth: Payer: Self-pay | Admitting: Hematology and Oncology

## 2019-05-25 NOTE — Telephone Encounter (Signed)
BMDC 10/14 MOVED FROM AM TO PM. COMFIRMED WITH PATIENT.

## 2019-06-01 ENCOUNTER — Other Ambulatory Visit: Payer: Self-pay | Admitting: Obstetrics & Gynecology

## 2019-06-01 ENCOUNTER — Other Ambulatory Visit (HOSPITAL_COMMUNITY): Payer: Self-pay | Admitting: Obstetrics & Gynecology

## 2019-06-01 DIAGNOSIS — N950 Postmenopausal bleeding: Secondary | ICD-10-CM

## 2019-06-07 ENCOUNTER — Emergency Department (HOSPITAL_COMMUNITY)
Admission: EM | Admit: 2019-06-07 | Discharge: 2019-06-07 | Disposition: A | Discharge status: Home / Self Care | Payer: Medicare HMO | Attending: Emergency Medicine | Admitting: Emergency Medicine

## 2019-06-07 ENCOUNTER — Other Ambulatory Visit: Payer: Self-pay

## 2019-06-07 ENCOUNTER — Encounter (HOSPITAL_COMMUNITY): Payer: Self-pay

## 2019-06-07 ENCOUNTER — Emergency Department (HOSPITAL_COMMUNITY): Payer: Medicare HMO

## 2019-06-07 DIAGNOSIS — N189 Chronic kidney disease, unspecified: Secondary | ICD-10-CM | POA: Insufficient documentation

## 2019-06-07 DIAGNOSIS — K529 Noninfective gastroenteritis and colitis, unspecified: Secondary | ICD-10-CM | POA: Diagnosis not present

## 2019-06-07 DIAGNOSIS — I129 Hypertensive chronic kidney disease with stage 1 through stage 4 chronic kidney disease, or unspecified chronic kidney disease: Secondary | ICD-10-CM | POA: Diagnosis not present

## 2019-06-07 DIAGNOSIS — Z79899 Other long term (current) drug therapy: Secondary | ICD-10-CM | POA: Diagnosis not present

## 2019-06-07 DIAGNOSIS — R1030 Lower abdominal pain, unspecified: Secondary | ICD-10-CM | POA: Diagnosis present

## 2019-06-07 DIAGNOSIS — E039 Hypothyroidism, unspecified: Secondary | ICD-10-CM | POA: Diagnosis not present

## 2019-06-07 DIAGNOSIS — R110 Nausea: Secondary | ICD-10-CM

## 2019-06-07 DIAGNOSIS — Z87891 Personal history of nicotine dependence: Secondary | ICD-10-CM | POA: Diagnosis not present

## 2019-06-07 DIAGNOSIS — Z7982 Long term (current) use of aspirin: Secondary | ICD-10-CM | POA: Insufficient documentation

## 2019-06-07 DIAGNOSIS — R1084 Generalized abdominal pain: Secondary | ICD-10-CM

## 2019-06-07 LAB — CBC
HCT: 42.3 % (ref 36.0–46.0)
Hemoglobin: 13.8 g/dL (ref 12.0–15.0)
MCH: 32.9 pg (ref 26.0–34.0)
MCHC: 32.6 g/dL (ref 30.0–36.0)
MCV: 101 fL — ABNORMAL HIGH (ref 80.0–100.0)
Platelets: 298 10*3/uL (ref 150–400)
RBC: 4.19 MIL/uL (ref 3.87–5.11)
RDW: 13.6 % (ref 11.5–15.5)
WBC: 12.1 10*3/uL — ABNORMAL HIGH (ref 4.0–10.5)
nRBC: 0.2 % (ref 0.0–0.2)

## 2019-06-07 LAB — URINALYSIS, ROUTINE W REFLEX MICROSCOPIC
Bilirubin Urine: NEGATIVE
Glucose, UA: NEGATIVE mg/dL
Ketones, ur: NEGATIVE mg/dL
Nitrite: NEGATIVE
Protein, ur: 30 mg/dL — AB
Specific Gravity, Urine: 1.024 (ref 1.005–1.030)
pH: 5 (ref 5.0–8.0)

## 2019-06-07 LAB — COMPREHENSIVE METABOLIC PANEL
ALT: 36 U/L (ref 0–44)
AST: 30 U/L (ref 15–41)
Albumin: 4 g/dL (ref 3.5–5.0)
Alkaline Phosphatase: 51 U/L (ref 38–126)
Anion gap: 10 (ref 5–15)
BUN: 16 mg/dL (ref 8–23)
CO2: 25 mmol/L (ref 22–32)
Calcium: 9.4 mg/dL (ref 8.9–10.3)
Chloride: 107 mmol/L (ref 98–111)
Creatinine, Ser: 0.82 mg/dL (ref 0.44–1.00)
GFR calc Af Amer: >60 mL/min (ref >60)
GFR calc non Af Amer: >60 mL/min (ref >60)
Glucose, Bld: 90 mg/dL (ref 70–99)
Potassium: 3.7 mmol/L (ref 3.5–5.1)
Sodium: 142 mmol/L (ref 135–145)
Total Bilirubin: 1.4 mg/dL — ABNORMAL HIGH (ref 0.3–1.2)
Total Protein: 6.7 g/dL (ref 6.5–8.1)

## 2019-06-07 LAB — LIPASE, BLOOD: Lipase: 29 U/L (ref 11–51)

## 2019-06-07 MED ORDER — METRONIDAZOLE 500 MG PO TABS: 500.0000 mg | ORAL_TABLET | Freq: Three times a day (TID) | ORAL | 0 refills | Status: AC

## 2019-06-07 MED ORDER — MORPHINE SULFATE (PF) 4 MG/ML IV SOLN
4.0000 mg | Freq: Once | INTRAVENOUS | Status: AC
  Administered 2019-06-07: 4 mg via INTRAVENOUS
  Filled 2019-06-07: qty 1

## 2019-06-07 MED ORDER — SODIUM CHLORIDE (PF) 0.9 % IJ SOLN
INTRAMUSCULAR | Status: AC
  Filled 2019-06-07: qty 50

## 2019-06-07 MED ORDER — MORPHINE SULFATE (PF) 4 MG/ML IV SOLN
4.0000 mg | Freq: Once | INTRAVENOUS | Status: AC
  Administered 2019-06-07: 16:00:00 4 mg via INTRAVENOUS
  Filled 2019-06-07: qty 1

## 2019-06-07 MED ORDER — CIPROFLOXACIN HCL 500 MG PO TABS
500.0000 mg | ORAL_TABLET | Freq: Once | ORAL | Status: AC
  Administered 2019-06-07: 19:00:00 500 mg via ORAL
  Filled 2019-06-07: qty 1

## 2019-06-07 MED ORDER — OXYCODONE-ACETAMINOPHEN 5-325 MG PO TABS: 1.0000 | ORAL_TABLET | ORAL | 0 refills | Status: DC | PRN

## 2019-06-07 MED ORDER — METRONIDAZOLE 500 MG PO TABS
500.0000 mg | ORAL_TABLET | Freq: Once | ORAL | Status: AC
  Administered 2019-06-07: 19:00:00 500 mg via ORAL
  Filled 2019-06-07: qty 1

## 2019-06-07 MED ORDER — SODIUM CHLORIDE 0.9 % IV BOLUS
1000.0000 mL | Freq: Once | INTRAVENOUS | Status: AC
  Administered 2019-06-07: 1000 mL via INTRAVENOUS

## 2019-06-07 MED ORDER — ONDANSETRON HCL 4 MG/2ML IJ SOLN
4.0000 mg | Freq: Once | INTRAMUSCULAR | Status: AC
  Administered 2019-06-07: 4 mg via INTRAVENOUS
  Filled 2019-06-07: qty 2

## 2019-06-07 MED ORDER — SODIUM CHLORIDE 0.9% FLUSH: 3.0000 mL | Freq: Once | INTRAVENOUS | Status: DC

## 2019-06-07 MED ORDER — ONDANSETRON HCL 4 MG PO TABS: 4.0000 mg | ORAL_TABLET | Freq: Three times a day (TID) | ORAL | 0 refills | Status: DC | PRN

## 2019-06-07 MED ORDER — CIPROFLOXACIN HCL 500 MG PO TABS: 500.0000 mg | ORAL_TABLET | Freq: Two times a day (BID) | ORAL | 0 refills | Status: AC

## 2019-06-07 MED ORDER — IOHEXOL 300 MG/ML  SOLN
100.0000 mL | Freq: Once | INTRAMUSCULAR | Status: AC | PRN
  Administered 2019-06-07: 100 mL via INTRAVENOUS

## 2019-06-07 NOTE — Discharge Instructions (Signed)
Your work-up today was concerning for ileitis, inflammation or infection within part of your small bowel.  We also saw the concerning thickened endometrium which is likely due to vaginal bleeding which you are currently being worked up for by your OB/GYN team.  As we discussed, we will you to continue your outpatient OB/GYN work-up instead of duplicating the work-up at this time.  Please go to that appointment for ultrasound and further management this week.  Please take the antibiotics to treat the ileitis as there may be a bacterial component causing her symptoms.  The attached p information are for colitis which is similar to the ileitis.  Lease use the nausea medicine and pain medicine to help treat your discomfort and to help you maintain hydration.  If any symptoms change or worsen acutely, please return to the return to department.

## 2019-06-07 NOTE — ED Triage Notes (Signed)
Pt c/o lower abd pain since Thursday. Pt states that it has wrapped around from her back. Pt states that it eased off yesterday, but then came back to day at 0600.  Pt also adds that she has had vaginal bleeding for 3-4 weeks. Pt states she has been seen by her PCP, and has an [place] on Tuesday.

## 2019-06-07 NOTE — ED Notes (Signed)
Patient transported to CT 

## 2019-06-07 NOTE — ED Provider Notes (Signed)
3:54 PM Care assumed from Dr. Ashok Cordia.  At time of transfer care, patient is awaiting results of labs and CT scan to look for etiology of lower abdominal discomfort.  Patient is reportedly currently being worked up by OB/GYN for postmenopausal bleeding.  Urine returned with previous change still present and he did not feel that UTI is seen given lack of urinary symptoms.  Anticipate reassessment after work-up results.   Patient's work-up shows evidence of ileitis as well as the abnormality with her uterus.  Further imaging with ultrasound was recommended to further evaluate the uterus however patient reports that she is comfortable following up with her OB/GYN team for further management of that.  She does not want to pursue ultrasound or further pelvic work-up until she sees her OB/GYN.  She agrees with taking antibiotics for the ileitis as well as pain and nausea medicine.  Patient had no other residual concerns and agrees with plan of care.  She understands return precautions.  Patient discharged in good condition.  Clinical Impression: 1. Ileitis   2. Nausea   3. Generalized abdominal pain     Disposition: Discharge  Condition: Good  I have discussed the results, Dx and Tx plan with the pt(& family if present). He/she/they expressed understanding and agree(s) with the plan. Discharge instructions discussed at great length. Strict return precautions discussed and pt &/or family have verbalized understanding of the instructions. No further questions at time of discharge.    Discharge Medication List as of 06/07/2019  7:10 PM    START taking these medications   Details  ciprofloxacin (CIPRO) 500 MG tablet Take 1 tablet (500 mg total) by mouth every 12 (twelve) hours for 10 days., Starting Sun 06/07/2019, Until Wed 06/17/2019, Normal    metroNIDAZOLE (FLAGYL) 500 MG tablet Take 1 tablet (500 mg total) by mouth 3 (three) times daily for 10 days., Starting Sun 06/07/2019, Until Wed 06/17/2019,  Normal    ondansetron (ZOFRAN) 4 MG tablet Take 1 tablet (4 mg total) by mouth every 8 (eight) hours as needed for nausea or vomiting., Starting Sun 06/07/2019, Normal    oxyCODONE-acetaminophen (PERCOCET/ROXICET) 5-325 MG tablet Take 1 tablet by mouth every 4 (four) hours as needed for severe pain., Starting Sun 06/07/2019, Normal        Follow Up: Your OBGYN and your PCP     Levin Erp, MD [place], [place] [address] [place] [address] [id number] [place]         , Gwenyth Allegra, MD 06/07/19 2351

## 2019-06-07 NOTE — ED Provider Notes (Signed)
[facility] DEPT Provider Note   CSN: [id number] Arrival date & time: 06/07/19  1221     History   Chief Complaint Chief Complaint  Patient presents with  . Abdominal Pain    HPI [name] is a 68 y.o. female.     Patient c/o mid to lower abdominal pain in the past 3 days. Symptoms acute onset, moderate-severe, constant, persistent, non radiating, worse w palpation. Denies hx same pain. Also notes intermittent/recurrent vaginal bleeding in the past 3-4 weeks - has seen her ob/gyn for same, plan for possible outpt u/s. Patient also notes recent uti, not currently on any antibiotic therapy. No dysuria, urgency or frequency. No previous hx frequent utis or kidney stones. Only prior abd surgery is remote hx cholecystectomy. No vomiting or diarrhea. Having normal bms, no constipation. No abd distension. No fever or chills.   The history is provided by the patient.  Abdominal Pain Associated symptoms: vaginal bleeding   Associated symptoms: no chest pain, no chills, no constipation, no diarrhea, no dysuria, no fever, no shortness of breath, no sore throat and no vomiting     Past Medical History:  Diagnosis Date  . Acute pancreatitis 02/2016  . Anxiety    hx  . Breast cancer ([place]) 2016   Left Breast Cancer  . Cellulitis of chest wall 03/29/2016   "left breast, arm, back"  . Chronic kidney disease    released from [facility] feb 16 states blood found in urine in past-tests done but could not find reason for blood  . Depression    hx  . Ductal carcinoma of left breast ([place]) 2016   Ductal carcinoma in situ (DCIS) of left breast  . GERD (gastroesophageal reflux disease)   . Heart murmur    "when I was a kid"  . Hypertension   . Hypothyroidism   . PONV (postoperative nausea and vomiting)    with cataract surgery and ist lumpectomy req patch and iv med for nausea    Patient Active Problem List   Diagnosis Date Noted  . Cellulitis of chest wall  03/29/2016  . HTN (hypertension) 03/29/2016  . Volume overload 03/05/2016  . Other acute pancreatitis   . Abnormal CT scan, pancreas or bile duct   . Severe sepsis ([place])   . History of Pancreatitis (July 2017) 03/02/2016  . Hypokalemia 03/02/2016  . Depression 03/02/2016  . Hypothyroidism 03/02/2016  . Sepsis ([place]) 03/02/2016  . Elevated lipase   . Breast cancer, left breast ([place]) 04/07/2015  . History of Breast cancer of upper-outer quadrant of left female breast s/p breast reconstruction 01/04/2015  . Gall stones 08/14/2011  . GERD (gastroesophageal reflux disease) 08/14/2011  . Abdominal gas pain 08/14/2011  . Anxiety disorder 08/14/2011    Past Surgical History:  Procedure Laterality Date  . BREAST BIOPSY Left 12/08/2014; 01/2015  . BREAST LUMPECTOMY WITH RADIOACTIVE SEED LOCALIZATION Left 01/04/2015   Procedure: LEFT PARTIAL MASTECTOMY WITH RADIOACTIVE SEED LOCALIZATION;  Surgeon: Fanny Skates, MD;  Location: [facility];  Service: General;  Laterality: Left;  . BREAST RECONSTRUCTION WITH PLACEMENT OF TISSUE EXPANDER AND FLEX HD (ACELLULAR HYDRATED DERMIS) Left 04/07/2015   Procedure: IMMEDIATE LEFT BREAST RECONSTRUCTION WITH PLACEMENT SALINE IMPLANT AND CHEST WALL RECONSTRUCTION WITH  ACELLULAR DERMAL MATRIX ;  Surgeon: Crissie Reese, MD;  Location: [facility];  Service: Plastics;  Laterality: Left;  . CATARACT EXTRACTION    . CATARACT EXTRACTION W/ INTRAOCULAR LENS IMPLANT Left   . CHOLECYSTECTOMY  09/17/2011  Procedure: LAPAROSCOPIC CHOLECYSTECTOMY WITH INTRAOPERATIVE CHOLANGIOGRAM;  Surgeon: [name], MD;  Location: [facility];  Service: General;  Laterality: N/A;  carm   . DENTAL SURGERY     rt upper tooth removed  and crown? placed  . DILATION AND CURETTAGE OF UTERUS    . EYE SURGERY    . HYSTEROSCOPY W/D&C N/A 10/28/2015   Procedure: DILATATION AND CURETTAGE /HYSTEROSCOPY, Myosure;  Surgeon: Alden Hipp, MD;  Location: [facility];  Service: Gynecology;  Laterality: N/A;  .  MASTECTOMY Left 2016  . NIPPLE SPARING MASTECTOMY/SENTINAL LYMPH NODE BIOPSY/RECONSTRUCTION/PLACEMENT OF TISSUE EXPANDER Left 04/07/2015   Procedure: LEFT NIPPLE SPARING MASTECTOMY WITH LEFT SENTINAL LYMPH NODE BIOPSY AND  RECONSTRUCTION WITH PLACEMENT OF TISSUE EXPANDER;  Surgeon: Fanny Skates, MD;  Location: [facility];  Service: General;  Laterality: Left;  . RE-EXCISION OF BREAST LUMPECTOMY Left 01/12/2015   Procedure: LEFT  BREAST LUMPECTOMY RE-EXCISION OF MARGINS;  Surgeon: Fanny Skates, MD;  Location: [facility];  Service: General;  Laterality: Left;     OB History    Gravida  1   Para  1   Term      Preterm      AB      Living        SAB      TAB      Ectopic      Multiple      Live Births           Obstetric Comments  She menarched at early age of 9 and went to menopause at age 70  She had one pregnancy, her first child was born at age 33 BC x 2-3 years  She had used IUD over 10 years She was never exposed to fertility medications or hormone replacement therapy           Home Medications    Prior to Admission medications   Medication Sig Start Date End Date Taking? Authorizing Provider  aspirin 81 MG chewable tablet Chew 81 mg by mouth daily.    [provider]  Calcium Carbonate-Vitamin D (CALCIUM 600 + D PO) Take 2 tablets by mouth daily.      [provider]  chlorhexidine (PERIDEX) 0.12 % solution As directed 03/27/16   [provider]  Cholecalciferol (VITAMIN D) 2000 units tablet Take 2,000 Units by mouth daily.    [provider]  levothyroxine (SYNTHROID, LEVOTHROID) 50 MCG tablet Take 50 mcg by mouth daily. 12/29/15   [provider]  MAGNESIUM PO Take by mouth daily.    [provider]  Multiple Vitamins-Minerals (ANTIOXIDANT PO) Take by mouth daily.    [provider]  Multiple Vitamins-Minerals (CENTRUM SILVER PO) Take 1 tablet by mouth daily.     [provider]  POTASSIUM CHLORIDE PO Take by mouth daily.    [provider]  Probiotic CAPS Take 1 capsule by mouth every morning.    [provider]  tamoxifen (NOLVADEX) 20 MG tablet Take 1 tablet (20 mg total) by mouth daily. 06/16/18   Nicholas Lose, MD  UNABLE TO FIND daily. Med Name: "Hair and Skin Vitamin"    [provider]    Family History Family History  Problem Relation Age of Onset  . Heart disease Mother   . Hypertension Mother   . Leukemia Father   . Heart disease Father   . Cancer Brother   . Breast cancer Maternal Aunt   . Colon cancer Other  Father's aunt    Social History Social History   Tobacco Use  . Smoking status: Former Smoker    Packs/day: 0.50    Years: 46.00    Pack years: 23.00    Types: Cigarettes    Quit date: 11/25/2014    Years since quitting: 4.5  . Smokeless tobacco: Never Used  Substance Use Topics  . Alcohol use: Yes    Comment: 03/29/2016 "might drink 4 drinks/year, if that"  . Drug use: No     Allergies   Other   Review of Systems Review of Systems  Constitutional: Negative for chills and fever.  HENT: Negative for sore throat.   Eyes: Negative for redness.  Respiratory: Negative for shortness of breath.   Cardiovascular: Negative for chest pain.  Gastrointestinal: Positive for abdominal pain. Negative for constipation, diarrhea and vomiting.  Endocrine: Negative for polyuria.  Genitourinary: Positive for vaginal bleeding. Negative for dysuria and flank pain.  Musculoskeletal: Negative for neck pain.  Skin: Negative for rash.  Neurological: Negative for headaches.  Hematological: Does not bruise/bleed easily.  Psychiatric/Behavioral: Negative for confusion.     Physical Exam Updated Vital Signs BP 124/66 (BP Location: Left Arm)   Pulse (!) 118   Temp 98.7 F (37.1 C) (Oral)   Resp 16   Wt 87 kg   SpO2 97%   BMI 33.98 kg/m   Physical Exam Vitals signs and nursing note  reviewed.  Constitutional:      Appearance: Normal appearance. She is well-developed.  HENT:     Head: Atraumatic.     Nose: Nose normal.     Mouth/Throat:     Mouth: Mucous membranes are moist.  Eyes:     General: No scleral icterus.    Conjunctiva/sclera: Conjunctivae normal.  Neck:     Musculoskeletal: Normal range of motion and neck supple. No neck rigidity or muscular tenderness.     Trachea: No tracheal deviation.  Cardiovascular:     Rate and Rhythm: Normal rate and regular rhythm.     Pulses: Normal pulses.     Heart sounds: Normal heart sounds. No murmur. No friction rub. No gallop.   Pulmonary:     Effort: Pulmonary effort is normal. No respiratory distress.     Breath sounds: Normal breath sounds.  Abdominal:     General: Bowel sounds are normal. There is no distension.     Palpations: Abdomen is soft.     Tenderness: There is abdominal tenderness. There is no guarding.     Comments: Mid to lower abd tenderness, moderate, bilateral.   Genitourinary:    Comments: No cva tenderness.  Musculoskeletal:        General: No swelling.  Skin:    General: Skin is warm and dry.     Findings: No rash.  Neurological:     Mental Status: She is alert.     Comments: Alert, speech normal.   Psychiatric:        Mood and Affect: Mood normal.      ED Treatments / Results  Labs (all labs ordered are listed, but only abnormal results are displayed) Results for orders placed or performed during the hospital encounter of 06/07/19  Urinalysis, Routine w reflex microscopic  Result Value Ref Range   Color, Urine AMBER (A) YELLOW   APPearance CLOUDY (A) CLEAR   Specific Gravity, Urine 1.024 1.005 - 1.030   pH 5.0 5.0 - 8.0   Glucose, UA NEGATIVE NEGATIVE mg/dL   Hgb urine  dipstick MODERATE (A) NEGATIVE   Bilirubin Urine NEGATIVE NEGATIVE   Ketones, ur NEGATIVE NEGATIVE mg/dL   Protein, ur 30 (A) NEGATIVE mg/dL   Nitrite NEGATIVE NEGATIVE   Leukocytes,Ua TRACE (A) NEGATIVE    RBC / HPF 11-20 0 - 5 RBC/hpf   WBC, UA 0-5 0 - 5 WBC/hpf   Bacteria, UA RARE (A) NONE SEEN   Squamous Epithelial / LPF 21-50 0 - 5   Mucus PRESENT    Hyaline Casts, UA PRESENT    Ca Oxalate Crys, UA PRESENT     EKG None  Radiology No results found.  Procedures Procedures (including critical care time)  Medications Ordered in ED Medications - No data to display   Initial Impression / Assessment and Plan / ED Course  I have reviewed the triage vital signs and the nursing notes.  Pertinent labs & imaging results that were available during my care of the patient were reviewed by me and considered in my medical decision making (see chart for details).  Iv ns. Labs sent.   Reviewed nursing notes and prior charts for additional history.   Morphine iv. zofran iv.   CT ordered.   Labs reviewed by me - ua with 0-5 wbc. Other labs and CT remain pending - signed out to Dr Tegeler to check labs and CT when resulted.     Final Clinical Impressions(s) / ED Diagnoses   Final diagnoses:  None    ED Discharge Orders    None       Lajean Saver, MD 06/07/19 [id number]

## 2019-06-07 NOTE — ED Notes (Signed)
x2 unsuccessful attempts by 2 RNs. [place] IV RN will attempt.

## 2019-06-09 ENCOUNTER — Ambulatory Visit (HOSPITAL_COMMUNITY)
Admission: RE | Admit: 2019-06-09 | Discharge: 2019-06-09 | Disposition: A | Discharge status: Home / Self Care | Payer: Medicare HMO | Source: Ambulatory Visit | Attending: Obstetrics & Gynecology | Admitting: Obstetrics & Gynecology

## 2019-06-09 ENCOUNTER — Other Ambulatory Visit: Payer: Self-pay

## 2019-06-09 DIAGNOSIS — N950 Postmenopausal bleeding: Secondary | ICD-10-CM | POA: Insufficient documentation

## 2019-06-16 NOTE — Progress Notes (Signed)
Patient Care Team: Levin Erp, MD as PCP - General (Internal Medicine) Fanny Skates, MD as Consulting Physician (General Surgery) Nicholas Lose, MD as Consulting Physician (Hematology and Oncology) Arloa Koh, MD (Inactive) as Consulting Physician (Radiation Oncology) Crissie Reese, MD as Consulting Physician (Plastic Surgery) Sylvan Cheese, NP as Nurse Practitioner (Hematology and Oncology)  DIAGNOSIS:    ICD-10-CM   1. Malignant neoplasm of upper-outer quadrant of left breast in female, estrogen receptor positive ([place])  C50.412    Z17.0     SUMMARY OF ONCOLOGIC HISTORY: Oncology History  History of Breast cancer of upper-outer quadrant of left female breast s/p breast reconstruction  12/01/2014 Mammogram   Left breast: new calcifications warranting further imaging   12/08/2014 Initial Biopsy   Left breast core needle bx (UOQ): DCIS with calcifications, high grade, ER+ (100%), PR- (0%).   12/13/2014 Breast MRI   Left breast patchy diffuse multifocal enhancement with postbiopsy changes   01/04/2015 Surgery   Left lumpectomy Dalbert Batman): DCIS, 3.3 cm, grade 2, ER+ (100%), PR- (0%), margins were close   01/12/2015 Surgery   Re-excision of left margin: close   02/15/2015 Mammogram   Left breast: residual calcifications similar in appearance to the previously biopsied malignant calcifications located along the anterior inferior margin and extending for approximately 3 cm inferior and anterior to the anterior portion of the seroma   03/01/2015 Procedure   Left breast core needle bx: DCIS with calcifications: ER+ (100%), PR+ (10%), grade 2   03/01/2015 Clinical Stage   Stage 0: Tis N0   04/07/2015 Definitive Surgery   Left mastectomy/SLNB Dalbert Batman) with placement of saline implant and reconstruction with acellular dermal matrix  Harlow Mares): DCIS with calcifications, ALH, LCIS, 0/2 lymph nodes negative, grade 2, tumor size 3 cm, margins 3 mm, ER+ (100%), PR+ (10%)     04/07/2015 Pathologic Stage   Stage 0: Tis N0   05/24/2015 -  Anti-estrogen oral therapy    tamoxifen 20 mg daily 5 years   08/26/2015 Survivorship   Survivorship care plan completed and mailed to patient in lieu of in person visit at her request   Breast cancer, left breast ([place])  04/07/2015 Initial Diagnosis   Breast cancer, left breast ([place])     CHIEF COMPLIANT: Follow-up of left breast DCIS on tamoxifen therapy  INTERVAL HISTORY: [name] is a 68 y.o. with above-mentioned history of left breast DCIS who underwent a lumpectomy and re-excision followed by mastectomy and who is currently on anti-estrogen therapy with tamoxifen. I last saw her a year ago. Mammogram on 02/23/19 showed no evidence of malignancy in the right breast. She presents to the clinic today for follow-up.  She is experiencing a lot of pain in the left shoulder and is seeing orthopedics. She was also informed that she has probable uterine thickening.  She is contemplating on doing a hysterectomy.  REVIEW OF SYSTEMS:   Constitutional: Denies fevers, chills or abnormal weight loss Eyes: Denies blurriness of vision Ears, nose, mouth, throat, and face: Denies mucositis or sore throat Respiratory: Denies cough, dyspnea or wheezes Cardiovascular: Denies palpitation, chest discomfort Gastrointestinal: Denies nausea, heartburn or change in bowel habits Skin: Denies abnormal skin rashes Lymphatics: Denies new lymphadenopathy or easy bruising Neurological: Denies numbness, tingling or new weaknesses Behavioral/Psych: Mood is stable, no new changes  Extremities: Left shoulder pain from bursitis Breast: denies any pain or lumps or nodules in either breasts All other systems were reviewed with the patient and are negative.  I have reviewed  the past medical history, past surgical history, social history and family history with the patient and they are unchanged from previous note.  ALLERGIES:  is allergic to latex and  other.  MEDICATIONS:  Current Outpatient Medications  Medication Sig Dispense Refill  . aspirin 81 MG chewable tablet Chew 81 mg by mouth daily.    . Calcium Carbonate-Vitamin D (CALCIUM 600 + D PO) Take 2 tablets by mouth daily.      . Cholecalciferol (VITAMIN D) 2000 units tablet Take 2,000 Units by mouth daily.    . ciprofloxacin (CIPRO) 500 MG tablet Take 1 tablet (500 mg total) by mouth every 12 (twelve) hours for 10 days. 20 tablet 0  . glucosamine-chondroitin 500-400 MG tablet Take 2 tablets by mouth daily.    Marland Kitchen levothyroxine (SYNTHROID, LEVOTHROID) 50 MCG tablet Take 50 mcg by mouth daily.    Marland Kitchen losartan (COZAAR) 100 MG tablet Take 100 mg by mouth daily.     Marland Kitchen MAGNESIUM PO Take 1 tablet by mouth daily.     . metroNIDAZOLE (FLAGYL) 500 MG tablet Take 1 tablet (500 mg total) by mouth 3 (three) times daily for 10 days. 30 tablet 0  . Multiple Vitamins-Minerals (ANTIOXIDANT PO) Take 1 tablet by mouth daily.     . ondansetron (ZOFRAN) 4 MG tablet Take 1 tablet (4 mg total) by mouth every 8 (eight) hours as needed for nausea or vomiting. 12 tablet 0  . oxyCODONE-acetaminophen (PERCOCET/ROXICET) 5-325 MG tablet Take 1 tablet by mouth every 4 (four) hours as needed for severe pain. 12 tablet 0  . POTASSIUM CHLORIDE PO Take 1 tablet by mouth daily.     . Probiotic CAPS Take 1 capsule by mouth every morning.    . tamoxifen (NOLVADEX) 20 MG tablet Take 1 tablet (20 mg total) by mouth daily. 90 tablet 3   No current facility-administered medications for this visit.     PHYSICAL EXAMINATION: ECOG PERFORMANCE STATUS: 1 - Symptomatic but completely ambulatory  There were no vitals filed for this visit. There were no vitals filed for this visit.  GENERAL: alert, no distress and comfortable SKIN: skin color, texture, turgor are normal, no rashes or significant lesions EYES: normal, Conjunctiva are pink and non-injected, sclera clear OROPHARYNX: no exudate, no erythema and lips, buccal mucosa,  and tongue normal  NECK: supple, thyroid normal size, non-tender, without nodularity LYMPH: no palpable lymphadenopathy in the cervical, axillary or inguinal LUNGS: clear to auscultation and percussion with normal breathing effort HEART: regular rate & rhythm and no murmurs and no lower extremity edema ABDOMEN: abdomen soft, non-tender and normal bowel sounds MUSCULOSKELETAL: no cyanosis of digits and no clubbing  NEURO: alert & oriented x 3 with fluent speech, no focal motor/sensory deficits EXTREMITIES: No lower extremity edema BREAST: No palpable masses or nodules in either right or left breasts.  Left mastectomy with reconstruction.  No palpable axillary supraclavicular or infraclavicular adenopathy no breast tenderness or nipple discharge. (exam performed in the presence of a chaperone)  LABORATORY DATA:  I have reviewed the data as listed CMP Latest Ref Rng & Units 06/07/2019 03/31/2016 03/30/2016  Glucose 70 - 99 mg/dL 90 118(H) 113(H)  BUN 8 - 23 mg/dL 16 6 8   Creatinine 0.44 - 1.00 mg/dL 0.82 0.90 0.94  Sodium 135 - 145 mmol/L 142 141 142  Potassium 3.5 - 5.1 mmol/L 3.7 4.0 3.4(L)  Chloride 98 - 111 mmol/L 107 112(H) 107  CO2 22 - 32 mmol/L 25 24 26   Calcium 8.9 -  10.3 mg/dL 9.4 8.7(L) 8.5(L)  Total Protein 6.5 - 8.1 g/dL 6.7 - 5.4(L)  Total Bilirubin 0.3 - 1.2 mg/dL 1.4(H) - 0.8  Alkaline Phos 38 - 126 U/L 51 - 63  AST 15 - 41 U/L 30 - 62(H)  ALT 0 - 44 U/L 36 - 51    Lab Results  Component Value Date   WBC 12.1 (H) 06/07/2019   HGB 13.8 06/07/2019   HCT 42.3 06/07/2019   MCV 101.0 (H) 06/07/2019   PLT 298 06/07/2019   NEUTROABS 8.2 (H) 03/29/2016    ASSESSMENT & PLAN:  History of Breast cancer of upper-outer quadrant of left female breast s/p breast reconstruction Left lumpectomy 01/04/2015: DCIS 3.3 cm, grade 2, ER 100%, PR 0%, margins were close underwent reexcision margins negative, repeat mammogram showed calcifications and biopsy showed DCIS   left mastectomy  with immediate reconstruction 04/07/2015: DCIS with calcifications, ALH, LCIS, 0/2 lymph nodes negative, grade 2, tumor size 3 cm, margins 3 mm, ER 100%, PR 10% Tis N0 stage 0  Current treatment: 1. Adjuvant tamoxifen 20 mg daily 5 years started mid September 2016. She has 1 more year left on the tamoxifen therapy. D&C uterus: Endometrial polyp 10/31/2015  Tamoxifen Toxicities: Hot flashes and night sweats.  She tried Effexor but she could not tolerate it and she discontinued it. Hospitalization 03/29/2016 to07/30/2017: Cellulitis of the chest wall, possible shingles Hospitalization 03/02/2016 to07/12/2015: Sepsis secondary to acute pancreatitis with early pseudocyst formation  Uterine bleeding: I instructed her to cut down the dose of tamoxifen to 10 mg daily.  I suspect tamoxifen to be the cause behind the uterine bleeding.  If there is any concern for uterine cancer then she will need to discontinue tamoxifen.   Breast Cancer Surveillance: 1. Breast exam 06/17/2019: Benign.  Left mastectomy with reconstruction severe tenderness in the left shoulder. 2. Mammogram  02/23/2019: Benign right breastBreast Density Category C    Return to clinic in 1 year for follow-up and after that she could be followed on an as-needed basis.    No orders of the defined types were placed in this encounter.  The patient has a good understanding of the overall plan. she agrees with it. she will call with any problems that may develop before the next visit here.  Nicholas Lose, MD 06/17/2019  Julious Oka Dorshimer am acting as scribe for Dr. Nicholas Lose.  I have reviewed the above documentation for accuracy and completeness, and I agree with the above.

## 2019-06-17 ENCOUNTER — Other Ambulatory Visit: Payer: Self-pay

## 2019-06-17 ENCOUNTER — Inpatient Hospital Stay: Payer: Medicare HMO | Attending: Hematology and Oncology | Admitting: Hematology and Oncology

## 2019-06-17 DIAGNOSIS — N951 Menopausal and female climacteric states: Secondary | ICD-10-CM | POA: Insufficient documentation

## 2019-06-17 DIAGNOSIS — C50412 Malignant neoplasm of upper-outer quadrant of left female breast: Secondary | ICD-10-CM | POA: Diagnosis present

## 2019-06-17 DIAGNOSIS — Z170 Estrogen receptor positive status [ER+]: Secondary | ICD-10-CM | POA: Insufficient documentation

## 2019-06-17 DIAGNOSIS — M25512 Pain in left shoulder: Secondary | ICD-10-CM | POA: Diagnosis not present

## 2019-06-17 DIAGNOSIS — Z9012 Acquired absence of left breast and nipple: Secondary | ICD-10-CM | POA: Insufficient documentation

## 2019-06-17 DIAGNOSIS — N939 Abnormal uterine and vaginal bleeding, unspecified: Secondary | ICD-10-CM | POA: Diagnosis not present

## 2019-06-17 DIAGNOSIS — Z79810 Long term (current) use of selective estrogen receptor modulators (SERMs): Secondary | ICD-10-CM | POA: Diagnosis not present

## 2019-06-17 NOTE — Assessment & Plan Note (Signed)
Left lumpectomy 01/04/2015: DCIS 3.3 cm, grade 2, ER 100%, PR 0%, margins were close underwent reexcision margins negative, repeat mammogram showed calcifications and biopsy showed DCIS   left mastectomy with immediate reconstruction 04/07/2015: DCIS with calcifications, ALH, LCIS, 0/2 lymph nodes negative, grade 2, tumor size 3 cm, margins 3 mm, ER 100%, PR 10% Tis N0 stage 0  Current treatment: 1. Adjuvant tamoxifen 20 mg daily 5 years started mid September 2016. She has 1 more year left on the tamoxifen therapy. D&C uterus: Endometrial polyp 10/31/2015  Tamoxifen Toxicities: very occasional night sweats otherwise doing well Hospitalization 03/29/2016 to07/30/2017: Cellulitis of the chest wall, possible shingles Hospitalization 03/02/2016 to07/12/2015: Sepsis secondary to acute pancreatitis with early pseudocyst formation   Breast Cancer Surveillance: 1. Breast exam 06/17/2019: Normal 2. Mammogram  02/23/2019: Benign right breastBreast Density Category C  I sent a new prescription for tamoxifen. Return to clinic in 1 year for follow-up

## 2019-07-15 ENCOUNTER — Other Ambulatory Visit: Payer: Self-pay

## 2019-07-15 ENCOUNTER — Encounter (HOSPITAL_BASED_OUTPATIENT_CLINIC_OR_DEPARTMENT_OTHER): Payer: Self-pay | Admitting: *Deleted

## 2019-07-15 NOTE — Progress Notes (Signed)
Spoke w/ via phone for pre-op interview--- PT Lab needs dos----  None Labs results--- getting cbc, bmp, ekg done 07-17-2019 (results will be in epic) COVID test ------  07-17-2019 Arrive at ------- 1000 NPO after ------ [place] Medications to take morning of surgery -----  Synthroid, Prilosec, Tamoxifen w/ sips of water Diabetic medication ----- n/a Patient Special Instructions ----- n/a Pre-Op special Istructions ----- n/a  Patient verbalized understanding of instructions that were given at this phone interview. Patient denies shortness of breath, chest pain, fever, cough a this phone interview.

## 2019-07-17 ENCOUNTER — Encounter (HOSPITAL_COMMUNITY)
Admission: RE | Admit: 2019-07-17 | Discharge: 2019-07-17 | Disposition: A | Discharge status: Home / Self Care | Payer: Medicare HMO | Source: Ambulatory Visit | Attending: Obstetrics | Admitting: Obstetrics

## 2019-07-17 ENCOUNTER — Other Ambulatory Visit: Payer: Self-pay

## 2019-07-17 ENCOUNTER — Other Ambulatory Visit (HOSPITAL_COMMUNITY)
Admission: RE | Admit: 2019-07-17 | Discharge: 2019-07-17 | Disposition: A | Discharge status: Home / Self Care | Payer: Medicare HMO | Source: Ambulatory Visit | Attending: Obstetrics | Admitting: Obstetrics

## 2019-07-17 DIAGNOSIS — Z01818 Encounter for other preprocedural examination: Secondary | ICD-10-CM | POA: Diagnosis not present

## 2019-07-17 DIAGNOSIS — Z20828 Contact with and (suspected) exposure to other viral communicable diseases: Secondary | ICD-10-CM | POA: Insufficient documentation

## 2019-07-17 LAB — BASIC METABOLIC PANEL
Anion gap: 9 (ref 5–15)
BUN: 9 mg/dL (ref 8–23)
CO2: 26 mmol/L (ref 22–32)
Calcium: 9.2 mg/dL (ref 8.9–10.3)
Chloride: 107 mmol/L (ref 98–111)
Creatinine, Ser: 0.8 mg/dL (ref 0.44–1.00)
GFR calc Af Amer: >60 mL/min (ref >60)
GFR calc non Af Amer: >60 mL/min (ref >60)
Glucose, Bld: 98 mg/dL (ref 70–99)
Potassium: 4.4 mmol/L (ref 3.5–5.1)
Sodium: 142 mmol/L (ref 135–145)

## 2019-07-17 LAB — CBC
HCT: 38.8 % (ref 36.0–46.0)
Hemoglobin: 12.6 g/dL (ref 12.0–15.0)
MCH: 33.1 pg (ref 26.0–34.0)
MCHC: 32.5 g/dL (ref 30.0–36.0)
MCV: 101.8 fL — ABNORMAL HIGH (ref 80.0–100.0)
Platelets: 286 10*3/uL (ref 150–400)
RBC: 3.81 MIL/uL — ABNORMAL LOW (ref 3.87–5.11)
RDW: 13.1 % (ref 11.5–15.5)
WBC: 9.1 10*3/uL (ref 4.0–10.5)
nRBC: 0 % (ref 0.0–0.2)

## 2019-07-17 LAB — ABO/RH: ABO/RH(D): O POS

## 2019-07-17 LAB — SARS CORONAVIRUS 2 (TAT 6-24 HRS): SARS Coronavirus 2: NEGATIVE

## 2019-07-20 ENCOUNTER — Ambulatory Visit (HOSPITAL_BASED_OUTPATIENT_CLINIC_OR_DEPARTMENT_OTHER): Payer: Medicare HMO | Admitting: Anesthesiology

## 2019-07-20 ENCOUNTER — Other Ambulatory Visit: Payer: Self-pay

## 2019-07-20 ENCOUNTER — Ambulatory Visit (HOSPITAL_BASED_OUTPATIENT_CLINIC_OR_DEPARTMENT_OTHER)
Admission: RE | Admit: 2019-07-20 | Discharge: 2019-07-20 | Disposition: A | Discharge status: Home / Self Care | Payer: Medicare HMO | Attending: Obstetrics | Admitting: Obstetrics

## 2019-07-20 ENCOUNTER — Encounter (HOSPITAL_BASED_OUTPATIENT_CLINIC_OR_DEPARTMENT_OTHER): Payer: Self-pay | Admitting: *Deleted

## 2019-07-20 ENCOUNTER — Encounter (HOSPITAL_BASED_OUTPATIENT_CLINIC_OR_DEPARTMENT_OTHER)
Admission: RE | Disposition: A | Discharge status: Home / Self Care | Payer: Self-pay | Source: Home / Self Care | Attending: Obstetrics

## 2019-07-20 DIAGNOSIS — Z79899 Other long term (current) drug therapy: Secondary | ICD-10-CM | POA: Diagnosis not present

## 2019-07-20 DIAGNOSIS — C50412 Malignant neoplasm of upper-outer quadrant of left female breast: Secondary | ICD-10-CM | POA: Insufficient documentation

## 2019-07-20 DIAGNOSIS — E039 Hypothyroidism, unspecified: Secondary | ICD-10-CM | POA: Insufficient documentation

## 2019-07-20 DIAGNOSIS — K219 Gastro-esophageal reflux disease without esophagitis: Secondary | ICD-10-CM | POA: Diagnosis not present

## 2019-07-20 DIAGNOSIS — Z87891 Personal history of nicotine dependence: Secondary | ICD-10-CM | POA: Insufficient documentation

## 2019-07-20 DIAGNOSIS — Z7982 Long term (current) use of aspirin: Secondary | ICD-10-CM | POA: Diagnosis not present

## 2019-07-20 DIAGNOSIS — E669 Obesity, unspecified: Secondary | ICD-10-CM | POA: Diagnosis not present

## 2019-07-20 DIAGNOSIS — I10 Essential (primary) hypertension: Secondary | ICD-10-CM | POA: Diagnosis not present

## 2019-07-20 DIAGNOSIS — Z6832 Body mass index (BMI) 32.0-32.9, adult: Secondary | ICD-10-CM | POA: Insufficient documentation

## 2019-07-20 DIAGNOSIS — N950 Postmenopausal bleeding: Secondary | ICD-10-CM | POA: Insufficient documentation

## 2019-07-20 DIAGNOSIS — F419 Anxiety disorder, unspecified: Secondary | ICD-10-CM | POA: Insufficient documentation

## 2019-07-20 DIAGNOSIS — R9389 Abnormal findings on diagnostic imaging of other specified body structures: Secondary | ICD-10-CM | POA: Insufficient documentation

## 2019-07-20 DIAGNOSIS — F329 Major depressive disorder, single episode, unspecified: Secondary | ICD-10-CM | POA: Insufficient documentation

## 2019-07-20 DIAGNOSIS — Z79890 Hormone replacement therapy (postmenopausal): Secondary | ICD-10-CM | POA: Insufficient documentation

## 2019-07-20 DIAGNOSIS — Z170 Estrogen receptor positive status [ER+]: Secondary | ICD-10-CM | POA: Diagnosis not present

## 2019-07-20 DIAGNOSIS — N840 Polyp of corpus uteri: Secondary | ICD-10-CM | POA: Insufficient documentation

## 2019-07-20 HISTORY — DX: Malignant neoplasm of upper-outer quadrant of left female breast: C50.412

## 2019-07-20 HISTORY — DX: Presence of spectacles and contact lenses: Z97.3

## 2019-07-20 HISTORY — DX: Personal history of other diseases of the circulatory system: Z86.79

## 2019-07-20 HISTORY — DX: Personal history of diseases of the skin and subcutaneous tissue: Z87.2

## 2019-07-20 HISTORY — DX: Personal history of other diseases of the digestive system: Z87.19

## 2019-07-20 HISTORY — PX: DILATATION & CURETTAGE/HYSTEROSCOPY WITH MYOSURE: SHX6511

## 2019-07-20 HISTORY — DX: Bursitis of unspecified shoulder: M75.50

## 2019-07-20 HISTORY — DX: Malignant neoplasm of upper-outer quadrant of left female breast: Z17.0

## 2019-07-20 HISTORY — DX: Noninfective gastroenteritis and colitis, unspecified: K52.9

## 2019-07-20 HISTORY — DX: Postmenopausal bleeding: N95.0

## 2019-07-20 LAB — TYPE AND SCREEN
ABO/RH(D): O POS
Antibody Screen: NEGATIVE

## 2019-07-20 SURGERY — DILATATION & CURETTAGE/HYSTEROSCOPY WITH MYOSURE
Anesthesia: General | Site: Uterus

## 2019-07-20 MED ORDER — ACETAMINOPHEN 500 MG PO TABS
ORAL_TABLET | ORAL | Status: AC
  Filled 2019-07-20: qty 2

## 2019-07-20 MED ORDER — OXYCODONE HCL 5 MG PO TABS
5.0000 mg | ORAL_TABLET | Freq: Once | ORAL | Status: DC | PRN
  Filled 2019-07-20: qty 1

## 2019-07-20 MED ORDER — ONDANSETRON HCL 4 MG/2ML IJ SOLN
INTRAMUSCULAR | Status: DC | PRN
  Administered 2019-07-20: 4 mg via INTRAVENOUS

## 2019-07-20 MED ORDER — DEXAMETHASONE SODIUM PHOSPHATE 10 MG/ML IJ SOLN
INTRAMUSCULAR | Status: DC | PRN
  Administered 2019-07-20: 5 mg via INTRAVENOUS

## 2019-07-20 MED ORDER — LACTATED RINGERS IV SOLN
INTRAVENOUS | Status: DC
  Administered 2019-07-20: 11:00:00 via INTRAVENOUS
  Filled 2019-07-20: qty 1000

## 2019-07-20 MED ORDER — LIDOCAINE HCL 1 % IJ SOLN
INTRAMUSCULAR | Status: DC | PRN
  Administered 2019-07-20: 10 mL

## 2019-07-20 MED ORDER — FENTANYL CITRATE (PF) 100 MCG/2ML IJ SOLN
INTRAMUSCULAR | Status: AC
  Filled 2019-07-20: qty 4

## 2019-07-20 MED ORDER — MIDAZOLAM HCL 2 MG/2ML IJ SOLN
INTRAMUSCULAR | Status: AC
  Filled 2019-07-20: qty 2

## 2019-07-20 MED ORDER — KETOROLAC TROMETHAMINE 30 MG/ML IJ SOLN
INTRAMUSCULAR | Status: DC | PRN
  Administered 2019-07-20: 30 mg via INTRAVENOUS

## 2019-07-20 MED ORDER — KETOROLAC TROMETHAMINE 30 MG/ML IJ SOLN
INTRAMUSCULAR | Status: AC
  Filled 2019-07-20: qty 1

## 2019-07-20 MED ORDER — ACETAMINOPHEN 500 MG PO TABS
1000.0000 mg | ORAL_TABLET | ORAL | Status: AC
  Administered 2019-07-20: 11:00:00 1000 mg via ORAL
  Filled 2019-07-20: qty 2

## 2019-07-20 MED ORDER — PROPOFOL 10 MG/ML IV BOLUS
INTRAVENOUS | Status: AC
  Filled 2019-07-20: qty 20

## 2019-07-20 MED ORDER — METOCLOPRAMIDE HCL 5 MG/ML IJ SOLN
10.0000 mg | Freq: Once | INTRAMUSCULAR | Status: DC | PRN
  Filled 2019-07-20: qty 2

## 2019-07-20 MED ORDER — CELECOXIB 200 MG PO CAPS
ORAL_CAPSULE | ORAL | Status: AC
  Filled 2019-07-20: qty 2

## 2019-07-20 MED ORDER — SODIUM CHLORIDE 0.9 % IR SOLN
Status: DC | PRN
  Administered 2019-07-20 (×2): 3000 mL

## 2019-07-20 MED ORDER — FENTANYL CITRATE (PF) 100 MCG/2ML IJ SOLN
INTRAMUSCULAR | Status: DC | PRN
  Administered 2019-07-20: 100 ug via INTRAVENOUS

## 2019-07-20 MED ORDER — GABAPENTIN 300 MG PO CAPS
ORAL_CAPSULE | ORAL | Status: AC
  Filled 2019-07-20: qty 1

## 2019-07-20 MED ORDER — CELECOXIB 400 MG PO CAPS
400.0000 mg | ORAL_CAPSULE | ORAL | Status: AC
  Administered 2019-07-20: 11:00:00 400 mg via ORAL
  Filled 2019-07-20: qty 1

## 2019-07-20 MED ORDER — MIDAZOLAM HCL 2 MG/2ML IJ SOLN
INTRAMUSCULAR | Status: DC | PRN
  Administered 2019-07-20: 1 mg via INTRAVENOUS

## 2019-07-20 MED ORDER — PROPOFOL 10 MG/ML IV BOLUS
INTRAVENOUS | Status: DC | PRN
  Administered 2019-07-20: 170 mg via INTRAVENOUS

## 2019-07-20 MED ORDER — LIDOCAINE 2% (20 MG/ML) 5 ML SYRINGE
INTRAMUSCULAR | Status: AC
  Filled 2019-07-20: qty 5

## 2019-07-20 MED ORDER — FENTANYL CITRATE (PF) 100 MCG/2ML IJ SOLN
25.0000 ug | INTRAMUSCULAR | Status: DC | PRN
  Filled 2019-07-20: qty 1

## 2019-07-20 MED ORDER — ONDANSETRON HCL 4 MG/2ML IJ SOLN
INTRAMUSCULAR | Status: AC
  Filled 2019-07-20: qty 2

## 2019-07-20 MED ORDER — DEXAMETHASONE SODIUM PHOSPHATE 10 MG/ML IJ SOLN
INTRAMUSCULAR | Status: AC
  Filled 2019-07-20: qty 1

## 2019-07-20 MED ORDER — GABAPENTIN 300 MG PO CAPS
300.0000 mg | ORAL_CAPSULE | ORAL | Status: AC
  Administered 2019-07-20: 11:00:00 300 mg via ORAL
  Filled 2019-07-20: qty 1

## 2019-07-20 MED ORDER — OXYCODONE HCL 5 MG/5ML PO SOLN
5.0000 mg | Freq: Once | ORAL | Status: DC | PRN
  Filled 2019-07-20: qty 5

## 2019-07-20 MED ORDER — LIDOCAINE 2% (20 MG/ML) 5 ML SYRINGE
INTRAMUSCULAR | Status: DC | PRN
  Administered 2019-07-20: 80 mg via INTRAVENOUS

## 2019-07-20 SURGICAL SUPPLY — 23 items
CATH ROBINSON RED A/P 16FR (CATHETERS) ×3 IMPLANT
CONT SPEC 4OZ CLIKSEAL STRL BL (MISCELLANEOUS) ×6 IMPLANT
COVER WAND RF STERILE (DRAPES) ×3 IMPLANT
DEVICE MYOSURE LITE (MISCELLANEOUS) IMPLANT
DEVICE MYOSURE REACH (MISCELLANEOUS) ×2 IMPLANT
GAUZE 4X4 16PLY RFD (DISPOSABLE) ×3 IMPLANT
GLOVE BIOGEL PI IND STRL 6.5 (GLOVE) ×1 IMPLANT
GLOVE BIOGEL PI IND STRL 7.0 (GLOVE) ×1 IMPLANT
GLOVE BIOGEL PI INDICATOR 6.5 (GLOVE) ×2
GLOVE BIOGEL PI INDICATOR 7.0 (GLOVE) ×6
GLOVE SURG SS PI 6.0 STRL IVOR (GLOVE) ×2 IMPLANT
GLOVE SURG SS PI 7.0 STRL IVOR (GLOVE) ×2 IMPLANT
GLOVE SURG SS PI 7.5 STRL IVOR (GLOVE) ×2 IMPLANT
GOWN STRL REUS W/TWL LRG LVL3 (GOWN DISPOSABLE) ×8 IMPLANT
IV NS IRRIG 3000ML ARTHROMATIC (IV SOLUTION) ×5 IMPLANT
KIT PROCEDURE FLUENT (KITS) ×3 IMPLANT
PACK VAGINAL MINOR WOMEN LF (CUSTOM PROCEDURE TRAY) ×3 IMPLANT
PAD OB MATERNITY 4.3X12.25 (PERSONAL CARE ITEMS) ×3 IMPLANT
SEAL CERVICAL OMNI LOK (ABLATOR) IMPLANT
SEAL ROD LENS SCOPE MYOSURE (ABLATOR) ×3 IMPLANT
SOCKS TISSUE FLUENT (MISCELLANEOUS) ×2 IMPLANT
TOWEL OR 17X26 10 PK STRL BLUE (TOWEL DISPOSABLE) ×4 IMPLANT
WATER STERILE IRR 500ML POUR (IV SOLUTION) ×3 IMPLANT

## 2019-07-20 NOTE — H&P (Signed)
68 y.o. G1P1 presents for hysteroscopy, D&C for postmenopausal bleeding.  This is a patient of Dr. Stann Mainland referred for recurrent PMB and thickened endometrium.  She had a normal pap smear in January 2020.  Spotting started in August. EMB at that time was normal.  Bleeding would come and go--light spotting. Was seen in ED 2-3 weeks ago. Did a CT scan for pain. Had thickening of endometrium noted. Bleeding stopped shortly after that visit.  She had an [place] to follow up the CT that shows thickened endometrium at 18 mm.  Abnormal mixed cystic and solid appearance of endocervical region.   Had similar episode in Jan 2017--had an endometrial polyp that Dr. Deatra Ina removed.   History of breast cancer, diagnosed in 2016, is on tamoxifen, with one more year left on tamoxifen therapy.     Past Medical History:  Diagnosis Date  . Chronic shoulder bursitis    bilateral--- left > right  . GERD (gastroesophageal reflux disease)   . History of acute pancreatitis    03-02-2016  w/ sepsis-- resolved  . History of cardiac murmur in childhood   . History of cellulitis    03-29-2016  chest wall cellulitis w/ possible shingles, resolved  . Hypertension    followed by pcp  (07-15-2019 per pt never had stress test)  . Hypothyroidism    followed by pcp  . Ileitis    dx ED visit in epic 06-07-2019 (07-15-2019  per pt symptoms have resolved)  . Malignant neoplasm of upper-outer quadrant of left breast in female, estrogen receptor positive [facility]) oncologist-- dr Lindi Adie   dx 04/ 2016,  DCIS left breast, Stage 0, Grade 2,  ER+/ PR negative-- s/p  left lumpectomy 01-04-2015, re-excision 01-12-2015, left mastectomy w/ node dissection 04-07-2015 with reconstruction;  started tamoxifen 09/ 2016  . PMB (postmenopausal bleeding)   . PONV (postoperative nausea and vomiting)   . Wears glasses     Past Surgical History:  Procedure Laterality Date  . BREAST BIOPSY Left 12/08/2014; 01/2015  . BREAST LUMPECTOMY WITH RADIOACTIVE  SEED LOCALIZATION Left 01/04/2015   Procedure: LEFT PARTIAL MASTECTOMY WITH RADIOACTIVE SEED LOCALIZATION;  Surgeon: Fanny Skates, MD;  Location: [place];  Service: General;  Laterality: Left;  . BREAST RECONSTRUCTION WITH PLACEMENT OF TISSUE EXPANDER AND FLEX HD (ACELLULAR HYDRATED DERMIS) Left 04/07/2015   Procedure: IMMEDIATE LEFT BREAST RECONSTRUCTION WITH PLACEMENT SALINE IMPLANT AND CHEST WALL RECONSTRUCTION WITH  ACELLULAR DERMAL MATRIX ;  Surgeon: Crissie Reese, MD;  Location: [place];  Service: Plastics;  Laterality: Left;  . CATARACT EXTRACTION W/ INTRAOCULAR LENS  IMPLANT, BILATERAL  2005; 2017  . CHOLECYSTECTOMY  09/17/2011   Procedure: LAPAROSCOPIC CHOLECYSTECTOMY WITH INTRAOPERATIVE CHOLANGIOGRAM;  Surgeon: [name], MD;  Location: [place];  Service: General;  Laterality: N/A;  carm   . HYSTEROSCOPY W/D&C N/A 10/28/2015   Procedure: DILATATION AND CURETTAGE /HYSTEROSCOPY, Myosure;  Surgeon: Alden Hipp, MD;  Location: [facility];  Service: Gynecology;  Laterality: N/A;  . NIPPLE SPARING MASTECTOMY/SENTINAL LYMPH NODE BIOPSY/RECONSTRUCTION/PLACEMENT OF TISSUE EXPANDER Left 04/07/2015   Procedure: LEFT NIPPLE SPARING MASTECTOMY WITH LEFT SENTINAL LYMPH NODE BIOPSY AND  RECONSTRUCTION WITH PLACEMENT OF TISSUE EXPANDER;  Surgeon: Fanny Skates, MD;  Location: [place];  Service: General;  Laterality: Left;  . RE-EXCISION OF BREAST LUMPECTOMY Left 01/12/2015   Procedure: LEFT  BREAST LUMPECTOMY RE-EXCISION OF MARGINS;  Surgeon: Fanny Skates, MD;  Location: [place];  Service: General;  Laterality: Left;    OB History  Gravida Para  Term Preterm AB Living  1 1          SAB TAB Ectopic Multiple Live Births               # Outcome Date GA Lbr Len/2nd Weight Sex Delivery Anes PTL Lv  1 Para             Obstetric Comments  She menarched at early age of 58 and went to menopause at age 26   She had one pregnancy, her first child was born at age 74  BC x 2-3 years   She had  used IUD over 10 years  She was never exposed to fertility medications or hormone replacement therapy    Social History   Socioeconomic History  . Marital status: Divorced    Spouse name: Not on file  . Number of children: 1  . Years of education: Not on file  . Highest education level: Not on file  Occupational History  . Occupation: Retired  Scientific laboratory technician  . Financial resource strain: Not on file  . Food insecurity    Worry: Not on file    Inability: Not on file  . Transportation needs    Medical: Not on file    Non-medical: Not on file  Tobacco Use  . Smoking status: Former Smoker    Packs/day: 0.50    Years: 46.00    Pack years: 23.00    Types: Cigarettes    Quit date: 11/25/2014    Years since quitting: 4.6  . Smokeless tobacco: Never Used  Substance and Sexual Activity  . Alcohol use: Yes    Comment: very rare  . Drug use: Never  . Sexual activity: Not on file  Lifestyle  . Physical activity    Days per week: Not on file    Minutes per session: Not on file  . Stress: Not on file   Latex and Other    Vitals:   07/20/19 1046  BP: (!) 175/85  Pulse: 71  Resp: 16  Temp: 97.9 F (36.6 C)  SpO2: 99%     General:  NAD Abdomen:  soft    A/P   68 y.o.  G1P1  presents for hysteroscopy, possible polypectomy vs dilation and curettage for postmenopausal bleeding and thickened endometrium  Discussed risks to include infection, bleeding, damage to surrounding structures (including but not limited to vagina, cervix, bladder, uterus), uterine perforation, inability to full diagnose etiology of postmenopausal bleeding, recurrence of bleeding, need for additional procedures.  If a polyp is identified, will plan removal with myosure. All questions answered and patient elects to proceed.   [place]

## 2019-07-20 NOTE — Anesthesia Preprocedure Evaluation (Signed)
Anesthesia Evaluation  Patient identified by MRN, date of birth, ID band Patient awake    Reviewed: Allergy & Precautions, NPO status , Patient's Chart, lab work & pertinent test results, Unable to perform ROS - Chart review only  History of Anesthesia Complications (+) PONV and history of anesthetic complications  Airway Mallampati: II  TM Distance: >3 FB Neck ROM: Full    Dental no notable dental hx. (+) Teeth Intact   Pulmonary former smoker,    Pulmonary exam normal breath sounds clear to auscultation       Cardiovascular hypertension, Pt. on medications Normal cardiovascular exam Rhythm:Regular Rate:Normal     Neuro/Psych PSYCHIATRIC DISORDERS Anxiety Depression negative neurological ROS     GI/Hepatic Neg liver ROS, GERD  Medicated and Controlled,Hx/o Pancreatitis due to choledocholithiasis   Endo/Other  Hypothyroidism Obesity Hx/o left breast Ca- S/P mastectomy  Renal/GU negative Renal ROS  negative genitourinary   Musculoskeletal negative musculoskeletal ROS (+)   Abdominal (+) + obese,   Peds  Hematology negative hematology ROS (+)   Anesthesia Other Findings   Reproductive/Obstetrics PMB                             Anesthesia Physical Anesthesia Plan  ASA: II  Anesthesia Plan: General   Post-op Pain Management:    Induction: Intravenous  PONV Risk Score and Plan: 4 or greater and Ondansetron, Treatment may vary due to age or medical condition and Dexamethasone  Airway Management Planned: LMA  Additional Equipment:   Intra-op Plan:   Post-operative Plan: Extubation in OR  Informed Consent: I have reviewed the patients History and Physical, chart, labs and discussed the procedure including the risks, benefits and alternatives for the proposed anesthesia with the patient or authorized representative who has indicated his/her understanding and acceptance.      Dental advisory given  Plan Discussed with: CRNA and Surgeon  Anesthesia Plan Comments:         Anesthesia Quick Evaluation

## 2019-07-20 NOTE — Transfer of Care (Signed)
Immediate Anesthesia Transfer of Care Note  Patient: [name]  Procedure(s) Performed: Procedure(s) (LRB): DILATATION & CURETTAGE/HYSTEROSCOPY WITH MYOSURE (N/A)  Patient Location: PACU  Anesthesia Type: General  Level of Consciousness: awake, oriented, sedated and patient cooperative  Airway & Oxygen Therapy: Patient Spontanous Breathing and Patient connected to face mask oxygen  Post-op Assessment: Report given to PACU RN and Post -op Vital signs reviewed and stable  Post vital signs: Reviewed and stable  Complications: No apparent anesthesia complications  Last Vitals:  Vitals Value Taken Time  BP 154/68 07/20/19 1253  Temp    Pulse 64 07/20/19 1255  Resp 14 07/20/19 1255  SpO2 100 % 07/20/19 1255  Vitals shown include unvalidated device data.  Last Pain:  Vitals:   07/20/19 1046  TempSrc: Oral

## 2019-07-20 NOTE — Discharge Instructions (Signed)
Pelvic rest x 2 weeks (no intercourse or tampons).  No tub baths or swimming for two weeks.    Call your doctor if you have heavy vaginal bleeding (soaking through a pad an hour or more for >2 hours in a row), temperature >101F, severe nausea, vomiting, severe or worsening abdominal pain, dizziness, shortness of breath, chest pain or any other concerns.  May take Tylenol for pain as needed.

## 2019-07-20 NOTE — Anesthesia Procedure Notes (Signed)
Procedure Name: LMA Insertion Date/Time: 07/20/2019 12:06 PM Performed by: Wanita Chamberlain, CRNA Pre-anesthesia Checklist: Patient identified, Timeout performed, Emergency Drugs available and Suction available Patient Re-evaluated:Patient Re-evaluated prior to induction Oxygen Delivery Method: Circle system utilized Preoxygenation: Pre-oxygenation with 100% oxygen Induction Type: IV induction Ventilation: Mask ventilation without difficulty LMA: LMA inserted LMA Size: 4.0 Number of attempts: 1 Placement Confirmation: breath sounds checked- equal and bilateral,  CO2 detector and positive ETCO2 Tube secured with: Tape Dental Injury: Teeth and Oropharynx as per pre-operative assessment

## 2019-07-20 NOTE — Anesthesia Postprocedure Evaluation (Signed)
Anesthesia Post Note  Patient: [name]  Procedure(s) Performed: DILATATION & CURETTAGE/HYSTEROSCOPY WITH MYOSURE (N/A Uterus)     Patient location during evaluation: PACU Anesthesia Type: General Level of consciousness: awake and alert and oriented Pain management: pain level controlled Vital Signs Assessment: post-procedure vital signs reviewed and stable Respiratory status: spontaneous breathing, nonlabored ventilation and respiratory function stable Cardiovascular status: blood pressure returned to baseline and stable Postop Assessment: no apparent nausea or vomiting Anesthetic complications: no    Last Vitals:  Vitals:   07/20/19 1300 07/20/19 1315  BP: 129/69 (!) 138/59  Pulse: 62 (!) 55  Resp: 10 10  Temp:    SpO2: 100% 100%    Last Pain:  Vitals:   07/20/19 1253  TempSrc:   PainSc: 0-No pain                 Nicle Connole A.

## 2019-07-20 NOTE — Op Note (Addendum)
Operative Note  Pre-operative Diagnosis: post menopausal bleeding and thickened endometrium   Post-operative Diagnosis:  post menopausal bleeding, thickened endometrium and endometrial polyps  Surgeon: Jerelyn Charles, MD  Procedure: hysteroscopy, polypectomy with MyoSure, dilation and curettage, endocervical curettage  Anesthesia: general  Estimated Blood Loss: minimal               Specimens: 1. Endometrial curettings and polyps  2.  Endocervical curettage         Findings: Uterus sounded to 9 cm.  The endometrium was universally thickened.  There was a large polyp originating from the posterior uterus filling the cavity as well as a small polyp originating near the right tubal ostia.  In the cervix, there were multiple, small benign appearing outpouchings of the normal endocervix.  Fluid deficit 340 mL.   Description of Procedure:         After adequate anesthesia was achieved, the patient placed in the dorsal lithotomy position in [place].  She was prepped and draped in the usual sterile fashion.  The bladder was drained with a catheter.  A bimanual exam revealed an anteverted uterus.  The bivalve speculum was placed in the vagina and the anterior lip of the cervix grasped with a single-tooth tenaculum.  10 mL of 1% lidocaine was injected in a paracervical block. The cervix was serially dilated with Hank dilators to 64mm.  Under direct visualization, the hysteroscope was advanced.  The uterine cavity was surveyed. There was a large polyp originating from the posterior uterus filling the cavity as well as a small polyp originating near the right tubal ostia. The endometrium was noted to be universally thickened. The MyoSure device was advanced into the uterine cavity.  Under direct visualization, the polyps were resected with the MyoSure.  The endometrium was uniformly sampled with the MyoSure until a normal cavity was restored.  The hysteroscope was removed and a curetting performed.   Attention was next turned to the cervix.  The cervix was long.  There were multiple, small benign appearing outpouchings of the normal endocervix.  The myosure was used to sample these areas under direct visualization.  With sampling, a mucus discharge was released, consistent with nabothian cysts.  The hysteroscope was removed and an endocervical curettage was performed.  The hysteroscope was advanced back into the cavity, and a uniform endometrial cavity seen without residual polyps.   The specimens were sent to pathology.  The hysteroscope was removed.  All the vaginal instruments were removed.  Counts were correct.  The patient was awakened from anesthesia and transferred to PACU in stable condition.    [place], [facility]

## 2019-07-21 ENCOUNTER — Encounter (HOSPITAL_BASED_OUTPATIENT_CLINIC_OR_DEPARTMENT_OTHER): Payer: Self-pay | Admitting: Obstetrics

## 2019-07-21 LAB — SURGICAL PATHOLOGY

## 2019-07-28 ENCOUNTER — Ambulatory Visit
Admission: RE | Admit: 2019-07-28 | Discharge: 2019-07-28 | Disposition: A | Discharge status: Home / Self Care | Payer: Medicare HMO | Source: Ambulatory Visit | Attending: Internal Medicine | Admitting: Internal Medicine

## 2019-07-28 ENCOUNTER — Other Ambulatory Visit: Payer: Self-pay

## 2019-07-28 DIAGNOSIS — M8580 Other specified disorders of bone density and structure, unspecified site: Secondary | ICD-10-CM

## 2019-11-25 ENCOUNTER — Other Ambulatory Visit: Payer: Self-pay

## 2019-11-25 MED ORDER — TAMOXIFEN CITRATE 10 MG PO TABS: 10.0000 mg | ORAL_TABLET | Freq: Every day | ORAL | 0 refills | Status: DC

## 2019-12-08 DIAGNOSIS — M79672 Pain in left foot: Secondary | ICD-10-CM | POA: Diagnosis not present

## 2019-12-08 DIAGNOSIS — R262 Difficulty in walking, not elsewhere classified: Secondary | ICD-10-CM | POA: Diagnosis not present

## 2019-12-10 DIAGNOSIS — M79672 Pain in left foot: Secondary | ICD-10-CM | POA: Diagnosis not present

## 2019-12-10 DIAGNOSIS — R262 Difficulty in walking, not elsewhere classified: Secondary | ICD-10-CM | POA: Diagnosis not present

## 2019-12-15 DIAGNOSIS — M79672 Pain in left foot: Secondary | ICD-10-CM | POA: Diagnosis not present

## 2019-12-15 DIAGNOSIS — R262 Difficulty in walking, not elsewhere classified: Secondary | ICD-10-CM | POA: Diagnosis not present

## 2019-12-17 DIAGNOSIS — M79672 Pain in left foot: Secondary | ICD-10-CM | POA: Diagnosis not present

## 2019-12-17 DIAGNOSIS — R262 Difficulty in walking, not elsewhere classified: Secondary | ICD-10-CM | POA: Diagnosis not present

## 2019-12-22 DIAGNOSIS — M79672 Pain in left foot: Secondary | ICD-10-CM | POA: Diagnosis not present

## 2019-12-22 DIAGNOSIS — R262 Difficulty in walking, not elsewhere classified: Secondary | ICD-10-CM | POA: Diagnosis not present

## 2019-12-23 DIAGNOSIS — R079 Chest pain, unspecified: Secondary | ICD-10-CM | POA: Diagnosis not present

## 2019-12-23 DIAGNOSIS — Z7189 Other specified counseling: Secondary | ICD-10-CM | POA: Diagnosis not present

## 2019-12-23 DIAGNOSIS — E669 Obesity, unspecified: Secondary | ICD-10-CM | POA: Diagnosis not present

## 2019-12-23 DIAGNOSIS — G4700 Insomnia, unspecified: Secondary | ICD-10-CM | POA: Diagnosis not present

## 2019-12-23 DIAGNOSIS — N3281 Overactive bladder: Secondary | ICD-10-CM | POA: Diagnosis not present

## 2019-12-23 DIAGNOSIS — I10 Essential (primary) hypertension: Secondary | ICD-10-CM | POA: Diagnosis not present

## 2019-12-24 DIAGNOSIS — R262 Difficulty in walking, not elsewhere classified: Secondary | ICD-10-CM | POA: Diagnosis not present

## 2019-12-24 DIAGNOSIS — M79672 Pain in left foot: Secondary | ICD-10-CM | POA: Diagnosis not present

## 2019-12-28 DIAGNOSIS — M722 Plantar fascial fibromatosis: Secondary | ICD-10-CM | POA: Diagnosis not present

## 2019-12-28 DIAGNOSIS — M2042 Other hammer toe(s) (acquired), left foot: Secondary | ICD-10-CM | POA: Diagnosis not present

## 2019-12-28 DIAGNOSIS — M2041 Other hammer toe(s) (acquired), right foot: Secondary | ICD-10-CM | POA: Diagnosis not present

## 2019-12-29 DIAGNOSIS — R262 Difficulty in walking, not elsewhere classified: Secondary | ICD-10-CM | POA: Diagnosis not present

## 2019-12-29 DIAGNOSIS — M79672 Pain in left foot: Secondary | ICD-10-CM | POA: Diagnosis not present

## 2019-12-31 DIAGNOSIS — M79672 Pain in left foot: Secondary | ICD-10-CM | POA: Diagnosis not present

## 2019-12-31 DIAGNOSIS — R262 Difficulty in walking, not elsewhere classified: Secondary | ICD-10-CM | POA: Diagnosis not present

## 2020-01-05 DIAGNOSIS — M79672 Pain in left foot: Secondary | ICD-10-CM | POA: Diagnosis not present

## 2020-01-05 DIAGNOSIS — R262 Difficulty in walking, not elsewhere classified: Secondary | ICD-10-CM | POA: Diagnosis not present

## 2020-01-08 DIAGNOSIS — R262 Difficulty in walking, not elsewhere classified: Secondary | ICD-10-CM | POA: Diagnosis not present

## 2020-01-08 DIAGNOSIS — M79672 Pain in left foot: Secondary | ICD-10-CM | POA: Diagnosis not present

## 2020-01-12 DIAGNOSIS — M79672 Pain in left foot: Secondary | ICD-10-CM | POA: Diagnosis not present

## 2020-01-12 DIAGNOSIS — R262 Difficulty in walking, not elsewhere classified: Secondary | ICD-10-CM | POA: Diagnosis not present

## 2020-01-14 DIAGNOSIS — R262 Difficulty in walking, not elsewhere classified: Secondary | ICD-10-CM | POA: Diagnosis not present

## 2020-01-14 DIAGNOSIS — M79672 Pain in left foot: Secondary | ICD-10-CM | POA: Diagnosis not present

## 2020-01-15 DIAGNOSIS — I10 Essential (primary) hypertension: Secondary | ICD-10-CM | POA: Diagnosis not present

## 2020-01-15 DIAGNOSIS — Z0000 Encounter for general adult medical examination without abnormal findings: Secondary | ICD-10-CM | POA: Diagnosis not present

## 2020-01-15 DIAGNOSIS — E038 Other specified hypothyroidism: Secondary | ICD-10-CM | POA: Diagnosis not present

## 2020-01-15 DIAGNOSIS — M859 Disorder of bone density and structure, unspecified: Secondary | ICD-10-CM | POA: Diagnosis not present

## 2020-01-18 DIAGNOSIS — M79672 Pain in left foot: Secondary | ICD-10-CM | POA: Diagnosis not present

## 2020-01-18 DIAGNOSIS — R262 Difficulty in walking, not elsewhere classified: Secondary | ICD-10-CM | POA: Diagnosis not present

## 2020-01-19 DIAGNOSIS — R7301 Impaired fasting glucose: Secondary | ICD-10-CM | POA: Diagnosis not present

## 2020-01-20 ENCOUNTER — Other Ambulatory Visit: Payer: Self-pay

## 2020-01-20 ENCOUNTER — Encounter: Payer: Self-pay | Admitting: Internal Medicine

## 2020-01-20 ENCOUNTER — Ambulatory Visit: Payer: Medicare HMO | Admitting: Internal Medicine

## 2020-01-20 VITALS — BP 164/84 | HR 61 | Ht 64.0 in | Wt 180.4 lb

## 2020-01-20 DIAGNOSIS — K859 Acute pancreatitis without necrosis or infection, unspecified: Secondary | ICD-10-CM

## 2020-01-20 DIAGNOSIS — C50912 Malignant neoplasm of unspecified site of left female breast: Secondary | ICD-10-CM | POA: Diagnosis not present

## 2020-01-20 DIAGNOSIS — I10 Essential (primary) hypertension: Secondary | ICD-10-CM | POA: Diagnosis not present

## 2020-01-20 DIAGNOSIS — E039 Hypothyroidism, unspecified: Secondary | ICD-10-CM | POA: Diagnosis not present

## 2020-01-20 DIAGNOSIS — R079 Chest pain, unspecified: Secondary | ICD-10-CM | POA: Diagnosis not present

## 2020-01-20 NOTE — Patient Instructions (Signed)
Medication Instructions:  NO CHANGES *If you need a refill on your cardiac medications before your next appointment, please call your pharmacy*   Lab Work: NOT NEEDED  Testing/Procedures: BOTH TEST WILL BE SCHEDULE AT [facility] Your physician has requested that you have an echocardiogram. Echocardiography is a painless test that uses sound waves to create images of your heart. It provides your doctor with information about the size and shape of your heart and how well your heart's chambers and valves are working. This procedure takes approximately one hour. There are no restrictions for this procedure.  AND  Your physician has requested that you have a lexiscan myoview. Please follow instruction sheet, as given.   Follow-Up: At [facility], you and your health needs are our priority.  As part of our continuing mission to provide you with exceptional heart care, we have created designated Provider Care Teams.  These Care Teams include your primary Cardiologist (physician) and Advanced Practice Providers (APPs -  Physician Assistants and Nurse Practitioners) who all work together to provide you with the care you need, when you need it.    Your next appointment:   1 month(s)  The format for your next appointment:   In Person  Provider:   Cherlynn Kaiser, MD

## 2020-01-20 NOTE — Progress Notes (Signed)
Cardiology Office Note:    Date:  01/20/2020   ID:  [name], DOB 1951/04/02, MRN [id number]  PCP:  Levin Erp, MD  Cardiologist:  No primary care provider on file.  Electrophysiologist:  None   Referring MD: Michael Boston, MD   Chief Complaint: Chest pain  History of Present Illness:    [name] is a 69 y.o. female with a history of hypertension, hypothyroidism, osteopenia, breast cancer in 2016 with mastectomy, on tamoxifen, and pancreatitis secondary to hydrochlorothiazide who presents today for evaluation of chest pain.    Her mother passed away on November 22, 2022, and she has had chest pain with grieving.  She notes chest pain starting in February and into the first part of March.  She describes this as a chest tightness where she could not breathe, with dull pains.  She took 2 aspirin and after approximately 30 minutes discomfort went away.  Chest pain occurs at rest, does not seem to be worse with activity.  She denies shortness of breath or dyspnea on exertion.  Chest pain is not worsened by deep inspiration.  She has also had 2 episodes of back pain relieved with a heating pad along the band of her bra which will last hours at a time and this is also not worsened by exertion.    She has a 100-pack-year smoking history.  She notes a sense of left hand tingling.  She denies PND, orthopnea, leg swelling, syncope, lightheadedness, dizziness.  Past Medical History:  Diagnosis Date  . Chronic shoulder bursitis    bilateral--- left > right  . GERD (gastroesophageal reflux disease)   . History of acute pancreatitis    03-02-2016  w/ sepsis-- resolved  . History of cardiac murmur in childhood   . History of cellulitis    03-29-2016  chest wall cellulitis w/ possible shingles, resolved  . Hypertension    followed by pcp  (07-15-2019 per pt never had stress test)  . Hypothyroidism    followed by pcp  . Ileitis    dx ED visit in epic 06-07-2019 (07-15-2019  per pt symptoms  have resolved)  . Malignant neoplasm of upper-outer quadrant of left breast in female, estrogen receptor positive [facility]) oncologist-- dr Lindi Adie   dx 04/ 2016,  DCIS left breast, Stage 0, Grade 2,  ER+/ PR negative-- s/p  left lumpectomy 01-04-2015, re-excision 01-12-2015, left mastectomy w/ node dissection 04-07-2015 with reconstruction;  started tamoxifen 09/ 2016  . PMB (postmenopausal bleeding)   . PONV (postoperative nausea and vomiting)   . Wears glasses     Past Surgical History:  Procedure Laterality Date  . BREAST BIOPSY Left 12/08/2014; 01/2015  . BREAST LUMPECTOMY WITH RADIOACTIVE SEED LOCALIZATION Left 01/04/2015   Procedure: LEFT PARTIAL MASTECTOMY WITH RADIOACTIVE SEED LOCALIZATION;  Surgeon: Fanny Skates, MD;  Location: [place];  Service: General;  Laterality: Left;  . BREAST RECONSTRUCTION WITH PLACEMENT OF TISSUE EXPANDER AND FLEX HD (ACELLULAR HYDRATED DERMIS) Left 04/07/2015   Procedure: IMMEDIATE LEFT BREAST RECONSTRUCTION WITH PLACEMENT SALINE IMPLANT AND CHEST WALL RECONSTRUCTION WITH  ACELLULAR DERMAL MATRIX ;  Surgeon: Crissie Reese, MD;  Location: [place];  Service: Plastics;  Laterality: Left;  . CATARACT EXTRACTION W/ INTRAOCULAR LENS  IMPLANT, BILATERAL  2005; 2017  . CHOLECYSTECTOMY  09/17/2011   Procedure: LAPAROSCOPIC CHOLECYSTECTOMY WITH INTRAOPERATIVE CHOLANGIOGRAM;  Surgeon: [name], MD;  Location: [place];  Service: General;  Laterality: N/A;  carm   . DILATATION & CURETTAGE/HYSTEROSCOPY WITH MYOSURE  N/A 07/20/2019   Procedure: DILATATION & CURETTAGE/HYSTEROSCOPY WITH MYOSURE;  Surgeon: Jerelyn Charles, MD;  Location[facility];  Service: Gynecology;  Laterality: N/A;  . HYSTEROSCOPY WITH D & C N/A 10/28/2015   Procedure: DILATATION AND CURETTAGE /HYSTEROSCOPY, Myosure;  Surgeon: Alden Hipp, MD;  Location: [facility];  Service: Gynecology;  Laterality: N/A;  . NIPPLE SPARING MASTECTOMY/SENTINAL LYMPH NODE BIOPSY/RECONSTRUCTION/PLACEMENT OF TISSUE  EXPANDER Left 04/07/2015   Procedure: LEFT NIPPLE SPARING MASTECTOMY WITH LEFT SENTINAL LYMPH NODE BIOPSY AND  RECONSTRUCTION WITH PLACEMENT OF TISSUE EXPANDER;  Surgeon: Fanny Skates, MD;  Location: [place];  Service: General;  Laterality: Left;  . RE-EXCISION OF BREAST LUMPECTOMY Left 01/12/2015   Procedure: LEFT  BREAST LUMPECTOMY RE-EXCISION OF MARGINS;  Surgeon: Fanny Skates, MD;  Location: [place];  Service: General;  Laterality: Left;    Current Medications: Current Meds  Medication Sig  . amitriptyline (ELAVIL) 10 MG tablet Take 10 mg by mouth at bedtime.  Marland Kitchen aspirin 81 MG chewable tablet Chew 81 mg by mouth daily.  . Calcium Carbonate-Vitamin D (CALCIUM 600 + D PO) Take 2 tablets by mouth daily.    . Cholecalciferol (VITAMIN D) 2000 units tablet Take 2,000 Units by mouth daily.  Marland Kitchen glucosamine-chondroitin 500-400 MG tablet Take 2 tablets by mouth daily.  Marland Kitchen levothyroxine (SYNTHROID, LEVOTHROID) 50 MCG tablet Take 50 mcg by mouth daily.   Marland Kitchen losartan (COZAAR) 100 MG tablet Take 100 mg by mouth daily.   . Multiple Vitamins-Minerals (ANTIOXIDANT PO) Take 1 tablet by mouth daily.   Marland Kitchen omeprazole (PRILOSEC) 20 MG capsule Take 20 mg by mouth daily.  . Probiotic CAPS Take 1 capsule by mouth every morning.  . tamoxifen (NOLVADEX) 10 MG tablet Take 1 tablet (10 mg total) by mouth daily.  Marland Kitchen VITAMIN E PO Take by mouth daily.     Allergies:   Latex and Other   Social History   Socioeconomic History  . Marital status: Divorced    Spouse name: Not on file  . Number of children: 1  . Years of education: Not on file  . Highest education level: Not on file  Occupational History  . Occupation: Retired  Tobacco Use  . Smoking status: Former Smoker    Packs/day: 0.50    Years: 46.00    Pack years: 23.00    Types: Cigarettes    Quit date: 11/25/2014    Years since quitting: 5.2  . Smokeless tobacco: Never Used  Vaping Use  . Vaping Use: Never used  Substance and Sexual  Activity  . Alcohol use: Yes    Comment: very rare  . Drug use: Never  . Sexual activity: Not on file  Other Topics Concern  . Not on file  Social History Narrative  . Not on file   Social Determinants of Health   Financial Resource Strain:   . Difficulty of Paying Living Expenses:   Food Insecurity:   . Worried About Charity fundraiser in the Last Year:   . Arboriculturist in the Last Year:   Transportation Needs:   . Film/video editor (Medical):   Marland Kitchen Lack of Transportation (Non-Medical):   Physical Activity:   . Days of Exercise per Week:   . Minutes of Exercise per Session:   Stress:   . Feeling of Stress :   Social Connections:   . Frequency of Communication with Friends and Family:   . Frequency of Social Gatherings with Friends and  Family:   . Attends Religious Services:   . Active Member of Clubs or Organizations:   . Attends Archivist Meetings:   Marland Kitchen Marital Status:      Family History: The patient's family history includes Breast cancer in her maternal aunt; Cancer in her brother; Colon cancer in an other family member; Heart disease in her father and mother; Hypertension in her mother; Leukemia in her father.  ROS:   Please see the history of present illness.    All other systems reviewed and are negative.  EKGs/Labs/Other Studies Reviewed:    The following studies were reviewed today:  EKG:  NSR, sinus arrhythmia.  I have independently reviewed the images from CT Chest 03/29/2016 - possible small amount of calcium in LM/LAD but not definitely seen.  Recent Labs: 06/07/2019: ALT 36 07/17/2019: BUN 9; Creatinine, Ser 0.80; Hemoglobin 12.6; Platelets 286; Potassium 4.4; Sodium 142  Recent Lipid Panel    Component Value Date/Time   CHOL 138 03/02/2016 1040   TRIG 94 03/02/2016 1040   HDL 53 03/02/2016 1040   CHOLHDL 2.6 03/02/2016 1040   VLDL 19 03/02/2016 1040   LDLCALC 66 03/02/2016 1040    Physical Exam:    VS:  BP (!) 164/84    Pulse 61   Ht 5\' 4"  (1.626 m)   Wt 180 lb 6.4 oz (81.8 kg)   BMI 30.97 kg/m     Wt Readings from Last 5 Encounters:  01/27/20 180 lb (81.6 kg)  01/20/20 180 lb 6.4 oz (81.8 kg)  07/20/19 185 lb 2 oz (84 kg)  06/17/19 187 lb 1.6 oz (84.9 kg)  06/07/19 191 lb 12.8 oz (87 kg)     Constitutional: No acute distress Eyes: sclera non-icteric, normal conjunctiva and lids ENMT: normal dentition, moist mucous membranes Cardiovascular: regular rhythm, normal rate, no murmurs. S1 and S2 normal. Radial pulses normal bilaterally. No jugular venous distention.  Respiratory: clear to auscultation bilaterally GI : normal bowel sounds, soft and nontender. No distention.   MSK: extremities warm, well perfused. No edema.  NEURO: grossly nonfocal exam, moves all extremities. PSYCH: alert and oriented x 3, normal mood and affect.   ASSESSMENT:    1. Chest pain, unspecified type   2. Essential hypertension   3. Malignant neoplasm of left female breast, unspecified estrogen receptor status, unspecified site of breast ([place])   4. Hypothyroidism, unspecified type   5. Acute pancreatitis, unspecified complication status, unspecified pancreatitis type    PLAN:    Chest pain, unspecified type - Plan: EKG 12-Lead, ECHOCARDIOGRAM COMPLETE, MYOCARDIAL PERFUSION IMAGING  Essential hypertension - Plan: EKG 12-Lead, ECHOCARDIOGRAM COMPLETE, MYOCARDIAL PERFUSION IMAGING  Malignant neoplasm of left female breast, unspecified estrogen receptor status, unspecified site of breast ([facility])  Hypothyroidism, unspecified type  Acute pancreatitis, unspecified complication status, unspecified pancreatitis type   We discussed in detail the etiologies of chest pain and work-up.  We will perform a stress Myoview to exclude ischemia in the setting of significant risk factors of smoking and hypertension.  We will perform an echocardiogram for structure and function of the heart and other etiologies for chest  pain.  Hypertension-per PCP notes, BP is fairly well controlled.  BP is elevated on our evaluation today, will reassess at next follow-up and if still elevated will make changes to therapy.  Currently, continue losartan 100 mg daily.  Acute grief-patient has been prescribed amitriptyline and of acute grief with her mother passing away in mid March.  Further management per PCP,  I discussed with the patient that emotional stress prompting chest pain can be indicative of true ischemic heart disease, therefore evaluation is warranted.  I have expressed my condolences for her loss.   Cherlynn Kaiser, MD [place]  [facility]    Medication Adjustments/Labs and Tests Ordered: Current medicines are reviewed at length with the patient today.  Concerns regarding medicines are outlined above.  Orders Placed This Encounter  Procedures  . MYOCARDIAL PERFUSION IMAGING  . EKG 12-Lead  . ECHOCARDIOGRAM COMPLETE   No orders of the defined types were placed in this encounter.   Patient Instructions  Medication Instructions:  NO CHANGES *If you need a refill on your cardiac medications before your next appointment, please call your pharmacy*   Lab Work: NOT NEEDED  Testing/Procedures: BOTH TEST WILL BE SCHEDULE AT [place] [facility] Your physician has requested that you have an echocardiogram. Echocardiography is a painless test that uses sound waves to create images of your heart. It provides your doctor with information about the size and shape of your heart and how well your heart's chambers and valves are working. This procedure takes approximately one hour. There are no restrictions for this procedure.  AND  Your physician has requested that you have a lexiscan myoview. Please follow instruction sheet, as given.   Follow-Up: At [facility], you and your health needs are our priority.  As part of our continuing mission to provide you with exceptional heart care, we  have created designated Provider Care Teams.  These Care Teams include your primary Cardiologist (physician) and Advanced Practice Providers (APPs -  Physician Assistants and Nurse Practitioners) who all work together to provide you with the care you need, when you need it.    Your next appointment:   1 month(s)  The format for your next appointment:   In Person  Provider:   Cherlynn Kaiser, MD

## 2020-01-21 DIAGNOSIS — M79672 Pain in left foot: Secondary | ICD-10-CM | POA: Diagnosis not present

## 2020-01-21 DIAGNOSIS — R262 Difficulty in walking, not elsewhere classified: Secondary | ICD-10-CM | POA: Diagnosis not present

## 2020-01-22 ENCOUNTER — Telehealth (HOSPITAL_COMMUNITY): Payer: Self-pay

## 2020-01-22 ENCOUNTER — Other Ambulatory Visit: Payer: Self-pay | Admitting: Internal Medicine

## 2020-01-22 DIAGNOSIS — E038 Other specified hypothyroidism: Secondary | ICD-10-CM | POA: Diagnosis not present

## 2020-01-22 DIAGNOSIS — Z1231 Encounter for screening mammogram for malignant neoplasm of breast: Secondary | ICD-10-CM

## 2020-01-22 DIAGNOSIS — R7301 Impaired fasting glucose: Secondary | ICD-10-CM | POA: Diagnosis not present

## 2020-01-22 DIAGNOSIS — Z853 Personal history of malignant neoplasm of breast: Secondary | ICD-10-CM | POA: Diagnosis not present

## 2020-01-22 DIAGNOSIS — Z1212 Encounter for screening for malignant neoplasm of rectum: Secondary | ICD-10-CM | POA: Diagnosis not present

## 2020-01-22 DIAGNOSIS — Z0000 Encounter for general adult medical examination without abnormal findings: Secondary | ICD-10-CM | POA: Diagnosis not present

## 2020-01-22 DIAGNOSIS — I10 Essential (primary) hypertension: Secondary | ICD-10-CM | POA: Diagnosis not present

## 2020-01-22 DIAGNOSIS — R82998 Other abnormal findings in urine: Secondary | ICD-10-CM | POA: Diagnosis not present

## 2020-01-22 NOTE — Telephone Encounter (Signed)
Encounter complete. 

## 2020-01-25 DIAGNOSIS — M79672 Pain in left foot: Secondary | ICD-10-CM | POA: Diagnosis not present

## 2020-01-25 DIAGNOSIS — R262 Difficulty in walking, not elsewhere classified: Secondary | ICD-10-CM | POA: Diagnosis not present

## 2020-01-26 DIAGNOSIS — M79672 Pain in left foot: Secondary | ICD-10-CM | POA: Diagnosis not present

## 2020-01-26 DIAGNOSIS — R262 Difficulty in walking, not elsewhere classified: Secondary | ICD-10-CM | POA: Diagnosis not present

## 2020-01-27 ENCOUNTER — Other Ambulatory Visit: Payer: Self-pay

## 2020-01-27 ENCOUNTER — Ambulatory Visit (HOSPITAL_COMMUNITY)
Admission: RE | Admit: 2020-01-27 | Discharge: 2020-01-27 | Disposition: A | Discharge status: Home / Self Care | Payer: Medicare HMO | Source: Ambulatory Visit | Attending: Cardiovascular Disease | Admitting: Cardiovascular Disease

## 2020-01-27 DIAGNOSIS — R072 Precordial pain: Secondary | ICD-10-CM

## 2020-01-27 DIAGNOSIS — I10 Essential (primary) hypertension: Secondary | ICD-10-CM

## 2020-01-27 LAB — MYOCARDIAL PERFUSION IMAGING
LV dias vol: 80 mL (ref 46–106)
LV sys vol: 27 mL
Peak HR: 92 {beats}/min
Rest HR: 54 {beats}/min
SDS: 1
SRS: 1
SSS: 2
TID: 1.18

## 2020-01-27 MED ORDER — REGADENOSON 0.4 MG/5ML IV SOLN
0.4000 mg | Freq: Once | INTRAVENOUS | Status: AC
  Administered 2020-01-27: 0.4 mg via INTRAVENOUS

## 2020-01-27 MED ORDER — TECHNETIUM TC 99M TETROFOSMIN IV KIT
9.5000 | PACK | Freq: Once | INTRAVENOUS | Status: AC | PRN
  Administered 2020-01-27: 9.5 via INTRAVENOUS
  Filled 2020-01-27: qty 10

## 2020-01-27 MED ORDER — TECHNETIUM TC 99M TETROFOSMIN IV KIT
31.4000 | PACK | Freq: Once | INTRAVENOUS | Status: AC | PRN
  Administered 2020-01-27: 31.4 via INTRAVENOUS
  Filled 2020-01-27: qty 32

## 2020-01-29 ENCOUNTER — Other Ambulatory Visit: Payer: Self-pay | Admitting: Internal Medicine

## 2020-01-29 DIAGNOSIS — Z87891 Personal history of nicotine dependence: Secondary | ICD-10-CM

## 2020-02-04 DIAGNOSIS — R262 Difficulty in walking, not elsewhere classified: Secondary | ICD-10-CM | POA: Diagnosis not present

## 2020-02-04 DIAGNOSIS — M79672 Pain in left foot: Secondary | ICD-10-CM | POA: Diagnosis not present

## 2020-02-09 DIAGNOSIS — R262 Difficulty in walking, not elsewhere classified: Secondary | ICD-10-CM | POA: Diagnosis not present

## 2020-02-09 DIAGNOSIS — M79672 Pain in left foot: Secondary | ICD-10-CM | POA: Diagnosis not present

## 2020-02-11 ENCOUNTER — Ambulatory Visit (HOSPITAL_COMMUNITY): Payer: Medicare HMO | Attending: Cardiovascular Disease

## 2020-02-11 ENCOUNTER — Other Ambulatory Visit: Payer: Self-pay

## 2020-02-11 DIAGNOSIS — I10 Essential (primary) hypertension: Secondary | ICD-10-CM

## 2020-02-11 DIAGNOSIS — R072 Precordial pain: Secondary | ICD-10-CM

## 2020-02-12 DIAGNOSIS — R262 Difficulty in walking, not elsewhere classified: Secondary | ICD-10-CM | POA: Diagnosis not present

## 2020-02-12 DIAGNOSIS — M79672 Pain in left foot: Secondary | ICD-10-CM | POA: Diagnosis not present

## 2020-02-16 ENCOUNTER — Other Ambulatory Visit: Payer: Self-pay

## 2020-02-16 ENCOUNTER — Ambulatory Visit
Admission: RE | Admit: 2020-02-16 | Discharge: 2020-02-16 | Disposition: A | Discharge status: Home / Self Care | Payer: Medicare HMO | Source: Ambulatory Visit | Attending: Internal Medicine | Admitting: Internal Medicine

## 2020-02-16 DIAGNOSIS — Z87891 Personal history of nicotine dependence: Secondary | ICD-10-CM | POA: Diagnosis not present

## 2020-02-23 ENCOUNTER — Telehealth: Payer: Self-pay | Admitting: *Deleted

## 2020-02-23 ENCOUNTER — Telehealth (INDEPENDENT_AMBULATORY_CARE_PROVIDER_SITE_OTHER): Payer: Medicare HMO | Admitting: Internal Medicine

## 2020-02-23 ENCOUNTER — Encounter: Payer: Self-pay | Admitting: Internal Medicine

## 2020-02-23 VITALS — Ht 64.0 in | Wt 175.0 lb

## 2020-02-23 DIAGNOSIS — C50912 Malignant neoplasm of unspecified site of left female breast: Secondary | ICD-10-CM

## 2020-02-23 DIAGNOSIS — I700 Atherosclerosis of aorta: Secondary | ICD-10-CM

## 2020-02-23 DIAGNOSIS — R079 Chest pain, unspecified: Secondary | ICD-10-CM

## 2020-02-23 DIAGNOSIS — E039 Hypothyroidism, unspecified: Secondary | ICD-10-CM | POA: Diagnosis not present

## 2020-02-23 DIAGNOSIS — I10 Essential (primary) hypertension: Secondary | ICD-10-CM

## 2020-02-23 NOTE — Progress Notes (Signed)
Virtual Visit via Telephone Note   This visit type was conducted due to national recommendations for restrictions regarding the COVID-19 Pandemic (e.g. social distancing) in an effort to limit this patient's exposure and mitigate transmission in our community.  Due to her co-morbid illnesses, this patient is at least at moderate risk for complications without adequate follow up.  This format is felt to be most appropriate for this patient at this time.  The patient did not have access to video technology/had technical difficulties with video requiring transitioning to audio format only (telephone).  All issues noted in this document were discussed and addressed.  No physical exam could be performed with this format.  Please refer to the patient's chart for her  consent to telehealth for [facility].   The patient was identified using 2 identifiers.  Date:  02/23/2020   ID:  [name], DOB 1951-05-03, MRN [id number]  Patient Location: Home Provider Location: Office  PCP:  Michael Boston, MD  Cardiologist:  No primary care provider on file.  Electrophysiologist:  None   Evaluation Performed:  Follow-Up Visit  Chief Complaint:  Chest pain  History of Present Illness:    [name] is a 69 y.o. female with HTN, hypothyroidism, osteopenia, breast cancer in 2016 with mastectomy and tamoxifen, and pancreatitis 2/2 to HCTZ. Presents for follow up of testing.   Early June was last episode of chest pain, right before stress test. Stress test low risk. Echo within normal limits.   Noted to have aortic atherosclerosis on CT chest 02/16/20. I have independently reviewed these images.  Atorvastatin started by PCP, I agree and we discussed this. She has not started yet.   The patient does not have symptoms concerning for COVID-19 infection (fever, chills, cough, or new shortness of breath).    Past Medical History:  Diagnosis Date   Chronic shoulder bursitis    bilateral--- left >  right   GERD (gastroesophageal reflux disease)    History of acute pancreatitis    03-02-2016  w/ sepsis-- resolved   History of cardiac murmur in childhood    History of cellulitis    03-29-2016  chest wall cellulitis w/ possible shingles, resolved   Hypertension    followed by pcp  (07-15-2019 per pt never had stress test)   Hypothyroidism    followed by pcp   Ileitis    dx ED visit in epic 06-07-2019 (07-15-2019  per pt symptoms have resolved)   Malignant neoplasm of upper-outer quadrant of left breast in female, estrogen receptor positive [facility] [address]) oncologist-- dr Lindi Adie   dx 04/ 2016,  DCIS left breast, Stage 0, Grade 2,  ER+/ PR negative-- s/p  left lumpectomy 01-04-2015, re-excision 01-12-2015, left mastectomy w/ node dissection 04-07-2015 with reconstruction;  started tamoxifen 09/ 2016   PMB (postmenopausal bleeding)    PONV (postoperative nausea and vomiting)    Wears glasses    Past Surgical History:  Procedure Laterality Date   BREAST BIOPSY Left 12/08/2014; 01/2015   BREAST LUMPECTOMY WITH RADIOACTIVE SEED LOCALIZATION Left 01/04/2015   Procedure: LEFT PARTIAL MASTECTOMY WITH RADIOACTIVE SEED LOCALIZATION;  Surgeon: Fanny Skates, MD;  Location: [place];  Service: General;  Laterality: Left;   BREAST RECONSTRUCTION WITH PLACEMENT OF TISSUE EXPANDER AND FLEX HD (ACELLULAR HYDRATED DERMIS) Left 04/07/2015   Procedure: IMMEDIATE LEFT BREAST RECONSTRUCTION WITH PLACEMENT SALINE IMPLANT AND CHEST WALL RECONSTRUCTION WITH  ACELLULAR DERMAL MATRIX ;  Surgeon: Crissie Reese, MD;  Location: [place];  Service:  Plastics;  Laterality: Left;   CATARACT EXTRACTION W/ INTRAOCULAR LENS  IMPLANT, BILATERAL  2005; 2017   CHOLECYSTECTOMY  09/17/2011   Procedure: LAPAROSCOPIC CHOLECYSTECTOMY WITH INTRAOPERATIVE CHOLANGIOGRAM;  Surgeon: [name], MD;  Location: [place];  Service: General;  Laterality: N/A;  carm    DILATATION & CURETTAGE/HYSTEROSCOPY WITH MYOSURE N/A 07/20/2019    Procedure: DILATATION & CURETTAGE/HYSTEROSCOPY WITH MYOSURE;  Surgeon: Jerelyn Charles, MD;  Location: [place];  Service: Gynecology;  Laterality: N/A;   HYSTEROSCOPY WITH D & C N/A 10/28/2015   Procedure: DILATATION AND CURETTAGE /HYSTEROSCOPY, Myosure;  Surgeon: Alden Hipp, MD;  Location: [facility];  Service: Gynecology;  Laterality: N/A;   NIPPLE SPARING MASTECTOMY/SENTINAL LYMPH NODE BIOPSY/RECONSTRUCTION/PLACEMENT OF TISSUE EXPANDER Left 04/07/2015   Procedure: LEFT NIPPLE SPARING MASTECTOMY WITH LEFT SENTINAL LYMPH NODE BIOPSY AND  RECONSTRUCTION WITH PLACEMENT OF TISSUE EXPANDER;  Surgeon: Fanny Skates, MD;  Location: [place];  Service: General;  Laterality: Left;   RE-EXCISION OF BREAST LUMPECTOMY Left 01/12/2015   Procedure: LEFT  BREAST LUMPECTOMY RE-EXCISION OF MARGINS;  Surgeon: Fanny Skates, MD;  Location: [place];  Service: General;  Laterality: Left;     Current Meds  Medication Sig   amitriptyline (ELAVIL) 10 MG tablet Take 10 mg by mouth at bedtime as needed.    Apoaequorin (PREVAGEN EXTRA STRENGTH PO) Take 1 tablet by mouth daily.   Ascorbic Acid (VITAMIN C) 1000 MG tablet Take 1,000 mg by mouth daily.   aspirin 81 MG chewable tablet Chew 81 mg by mouth daily.   Calcium Carb-Cholecalciferol (CALCIUM + VITAMIN D3 PO) Take 1,200 mg by mouth daily.   Cholecalciferol (VITAMIN D3) 50 MCG (2000 UT) TABS Take 1 tablet by mouth daily.   glucosamine-chondroitin 500-400 MG tablet Take 2 tablets by mouth daily.   levothyroxine (SYNTHROID, LEVOTHROID) 50 MCG tablet Take 50 mcg by mouth daily.    losartan (COZAAR) 100 MG tablet Take 100 mg by mouth daily.    Multiple Vitamins-Minerals (CENTRUM SILVER 50+WOMEN PO) Take 1 tablet by mouth daily.   Omega-3 Fatty Acids (OMEGA-3 FISH OIL) 300 MG CAPS Take 1 capsule by mouth daily.   omeprazole (PRILOSEC) 20 MG capsule Take 20 mg by mouth daily as needed.    Probiotic CAPS Take 1 capsule by mouth  every morning.   tamoxifen (NOLVADEX) 10 MG tablet Take 1 tablet (10 mg total) by mouth daily.   traZODone (DESYREL) 50 MG tablet Take 50 mg by mouth at bedtime as needed for sleep.   VITAMIN E PO Take by mouth daily.   zinc gluconate 50 MG tablet Take 50 mg by mouth daily.     Allergies:   Latex and Other   Social History   Tobacco Use   Smoking status: Former Smoker    Packs/day: 0.50    Years: 46.00    Pack years: 23.00    Types: Cigarettes    Quit date: 11/25/2014    Years since quitting: 5.2   Smokeless tobacco: Never Used  Vaping Use   Vaping Use: Never used  Substance Use Topics   Alcohol use: Yes    Comment: very rare   Drug use: Never     Family Hx: The patient's family history includes Breast cancer in her maternal aunt; Cancer in her brother; Colon cancer in an other family member; Heart disease in her father and mother; Hypertension in her mother; Leukemia in her father.  ROS:   Please see the history of present  illness.     All other systems reviewed and are negative.   Prior CV studies:   The following studies were reviewed today:    Labs/Other Tests and Data Reviewed:    EKG:  No ECG reviewed.  Recent Labs: 06/07/2019: ALT 36 07/17/2019: BUN 9; Creatinine, Ser 0.80; Hemoglobin 12.6; Platelets 286; Potassium 4.4; Sodium 142   Recent Lipid Panel Lab Results  Component Value Date/Time   CHOL 138 03/02/2016 10:40 AM   TRIG 94 03/02/2016 10:40 AM   HDL 53 03/02/2016 10:40 AM   CHOLHDL 2.6 03/02/2016 10:40 AM   LDLCALC 66 03/02/2016 10:40 AM    Wt Readings from Last 3 Encounters:  02/23/20 175 lb (79.4 kg)  01/27/20 180 lb (81.6 kg)  01/20/20 180 lb 6.4 oz (81.8 kg)     Objective:    Vital Signs:  Ht 5\' 4"  (1.626 m)    Wt 175 lb (79.4 kg)    BMI 30.04 kg/m    VITAL SIGNS:  reviewed GEN:  no acute distress RESPIRATORY:  normal respiratory effort, no increased work of breathing NEURO:  alert and oriented x 3, speech  normal PSYCH:  normal affect  ASSESSMENT & PLAN:    1. Chest pain, unspecified type   2. Essential hypertension   3. Malignant neoplasm of left female breast, unspecified estrogen receptor status, unspecified site of breast ([place])   4. Hypothyroidism, unspecified type   5. Aortic atherosclerosis (HCC)    Chest pain - no recurrence since before stress test. Stress test and echo normal. Continue to observe. On atenolol per PMD, ok to continue at this time.   HTN - on atenolol per pcp, can continue.   Hx of breast cancer - normal echo with normal strain.   Aortic atherosclerosis - started on statin. We discussed in detail and I agree with starting. She will pick up today.    COVID-19 Education: The signs and symptoms of COVID-19 were discussed with the patient and how to seek care for testing (follow up with PCP or arrange E-visit).  The importance of social distancing was discussed today.  Time:   Today, I have spent 11 minutes with the patient with telehealth technology discussing the above problems.     Medication Adjustments/Labs and Tests Ordered: Current medicines are reviewed at length with the patient today.  Concerns regarding medicines are outlined above.   Tests Ordered: No orders of the defined types were placed in this encounter.   Medication Changes: No orders of the defined types were placed in this encounter.   Follow Up: 6 mo  Signed, Elouise Munroe, MD  02/23/2020 9:08 AM    [place]

## 2020-02-23 NOTE — Patient Instructions (Addendum)
Medication Instructions:  She was started on atorvastatin but doesn't know the dose and hasn't picked it up yet.  Atorvastatin 40 mg  Daily Atenolol 25 mg  One tablet daily ( please chek wuth primary if you should continue with this medication ) ( this is okay with D Margaretann Loveless)  *If you need a refill on your cardiac medications before your next appointment, please call your pharmacy*   Lab Work: Not needed.   Testing/Procedures: Not needed   Follow-Up: At [facility], you and your health needs are our priority.  As part of our continuing mission to provide you with exceptional heart care, we have created designated Provider Care Teams.  These Care Teams include your primary Cardiologist (physician) and Advanced Practice Providers (APPs -  Physician Assistants and Nurse Practitioners) who all work together to provide you with the care you need, when you need it.  Your next appointment:   6 month(s)  The format for your next appointment:   In Person  Provider:   Cherlynn Kaiser, MD

## 2020-02-23 NOTE — Telephone Encounter (Signed)
RN spoke to patient. Instruction were given  from today's virtual visit 02/23/20 .  AVS SUMMARY has been sent by mychart .  Patient will start taking atorvastatin 40 mg daily per Primary instructions. Dr Margaretann Loveless is in agreement.  Patient states she will call primary to see if  She needs to start taking Atenolol .  Per Dr Margaretann Loveless she can take both Atenolol and Losartan . Patient will let office know about medications.  follow up appointment scheduled for Aug 15 2020 at 10 :20 am.   Patient verbalized understanding

## 2020-02-23 NOTE — Telephone Encounter (Signed)
  Patient Consent for Virtual Visit         [name] has provided verbal consent on 02/23/2020 for a virtual visit (video or telephone).   CONSENT FOR VIRTUAL VISIT FOR:  [name]  By participating in this virtual visit I agree to the following:  I hereby voluntarily request, consent and authorize [facility] and its employed or contracted physicians, physician assistants, nurse practitioners or other licensed health care professionals (the Practitioner), to provide me with telemedicine health care services (the "Services") as deemed necessary by the treating Practitioner. I acknowledge and consent to receive the Services by the Practitioner via telemedicine. I understand that the telemedicine visit will involve communicating with the Practitioner through live audiovisual communication technology and the disclosure of certain medical information by electronic transmission. I acknowledge that I have been given the opportunity to request an in-person assessment or other available alternative prior to the telemedicine visit and am voluntarily participating in the telemedicine visit.  I understand that I have the right to withhold or withdraw my consent to the use of telemedicine in the course of my care at any time, without affecting my right to future care or treatment, and that the Practitioner or I may terminate the telemedicine visit at any time. I understand that I have the right to inspect all information obtained and/or recorded in the course of the telemedicine visit and may receive copies of available information for a reasonable fee.  I understand that some of the potential risks of receiving the Services via telemedicine include:  Marland Kitchen Delay or interruption in medical evaluation due to technological equipment failure or disruption; . Information transmitted may not be sufficient (e.g. poor resolution of images) to allow for appropriate medical decision making by the Practitioner;  and/or  . In rare instances, security protocols could fail, causing a breach of personal health information.  Furthermore, I acknowledge that it is my responsibility to provide information about my medical history, conditions and care that is complete and accurate to the best of my ability. I acknowledge that Practitioner's advice, recommendations, and/or decision may be based on factors not within their control, such as incomplete or inaccurate data provided by me or distortions of diagnostic images or specimens that may result from electronic transmissions. I understand that the practice of medicine is not an exact science and that Practitioner makes no warranties or guarantees regarding treatment outcomes. I acknowledge that a copy of this consent can be made available to me via my patient portal ([place]), or I can request a printed copy by calling the office of [facility].    I understand that my insurance will be billed for this visit.   I have read or had this consent read to me. . I understand the contents of this consent, which adequately explains the benefits and risks of the Services being provided via telemedicine.  . I have been provided ample opportunity to ask questions regarding this consent and the Services and have had my questions answered to my satisfaction. . I give my informed consent for the services to be provided through the use of telemedicine in my medical care

## 2020-02-25 ENCOUNTER — Other Ambulatory Visit: Payer: Self-pay

## 2020-02-25 ENCOUNTER — Ambulatory Visit
Admission: RE | Admit: 2020-02-25 | Discharge: 2020-02-25 | Disposition: A | Discharge status: Home / Self Care | Payer: Medicare HMO | Source: Ambulatory Visit | Attending: Internal Medicine | Admitting: Internal Medicine

## 2020-02-25 DIAGNOSIS — D3132 Benign neoplasm of left choroid: Secondary | ICD-10-CM | POA: Diagnosis not present

## 2020-02-25 DIAGNOSIS — H5212 Myopia, left eye: Secondary | ICD-10-CM | POA: Diagnosis not present

## 2020-02-25 DIAGNOSIS — H5201 Hypermetropia, right eye: Secondary | ICD-10-CM | POA: Diagnosis not present

## 2020-02-25 DIAGNOSIS — Z1231 Encounter for screening mammogram for malignant neoplasm of breast: Secondary | ICD-10-CM | POA: Diagnosis not present

## 2020-02-25 DIAGNOSIS — Z961 Presence of intraocular lens: Secondary | ICD-10-CM | POA: Diagnosis not present

## 2020-02-25 DIAGNOSIS — H35412 Lattice degeneration of retina, left eye: Secondary | ICD-10-CM | POA: Diagnosis not present

## 2020-03-10 ENCOUNTER — Other Ambulatory Visit: Payer: Self-pay | Admitting: Hematology and Oncology

## 2020-03-11 ENCOUNTER — Telehealth: Payer: Self-pay | Admitting: Hematology and Oncology

## 2020-03-11 NOTE — Telephone Encounter (Signed)
Scheduled per 7/9 sch message. Pt is aware of appt time and date.

## 2020-05-24 DIAGNOSIS — M180 Bilateral primary osteoarthritis of first carpometacarpal joints: Secondary | ICD-10-CM | POA: Diagnosis not present

## 2020-05-24 DIAGNOSIS — I700 Atherosclerosis of aorta: Secondary | ICD-10-CM | POA: Diagnosis not present

## 2020-05-24 DIAGNOSIS — Z23 Encounter for immunization: Secondary | ICD-10-CM | POA: Diagnosis not present

## 2020-05-24 DIAGNOSIS — I10 Essential (primary) hypertension: Secondary | ICD-10-CM | POA: Diagnosis not present

## 2020-05-24 DIAGNOSIS — M8580 Other specified disorders of bone density and structure, unspecified site: Secondary | ICD-10-CM | POA: Diagnosis not present

## 2020-05-24 DIAGNOSIS — R7301 Impaired fasting glucose: Secondary | ICD-10-CM | POA: Diagnosis not present

## 2020-05-24 DIAGNOSIS — E785 Hyperlipidemia, unspecified: Secondary | ICD-10-CM | POA: Diagnosis not present

## 2020-05-24 DIAGNOSIS — J432 Centrilobular emphysema: Secondary | ICD-10-CM | POA: Diagnosis not present

## 2020-05-24 DIAGNOSIS — Z87891 Personal history of nicotine dependence: Secondary | ICD-10-CM | POA: Diagnosis not present

## 2020-05-24 DIAGNOSIS — Z853 Personal history of malignant neoplasm of breast: Secondary | ICD-10-CM | POA: Diagnosis not present

## 2020-05-24 DIAGNOSIS — E7849 Other hyperlipidemia: Secondary | ICD-10-CM | POA: Diagnosis not present

## 2020-06-15 NOTE — Progress Notes (Signed)
Patient Care Team: Michael Boston, MD as PCP - General (Internal Medicine) Fanny Skates, MD as Consulting Physician (General Surgery) Nicholas Lose, MD as Consulting Physician (Hematology and Oncology) Arloa Koh, MD (Inactive) as Consulting Physician (Radiation Oncology) Crissie Reese, MD as Consulting Physician (Plastic Surgery) Sylvan Cheese, NP as Nurse Practitioner (Hematology and Oncology)  DIAGNOSIS: No diagnosis found.  SUMMARY OF ONCOLOGIC HISTORY: Oncology History  History of Breast cancer of upper-outer quadrant of left female breast s/p breast reconstruction  12/01/2014 Mammogram   Left breast: new calcifications warranting further imaging   12/08/2014 Initial Biopsy   Left breast core needle bx (UOQ): DCIS with calcifications, high grade, ER+ (100%), PR- (0%).   12/13/2014 Breast MRI   Left breast patchy diffuse multifocal enhancement with postbiopsy changes   01/04/2015 Surgery   Left lumpectomy Dalbert Batman): DCIS, 3.3 cm, grade 2, ER+ (100%), PR- (0%), margins were close   01/12/2015 Surgery   Re-excision of left margin: close   02/15/2015 Mammogram   Left breast: residual calcifications similar in appearance to the previously biopsied malignant calcifications located along the anterior inferior margin and extending for approximately 3 cm inferior and anterior to the anterior portion of the seroma   03/01/2015 Procedure   Left breast core needle bx: DCIS with calcifications: ER+ (100%), PR+ (10%), grade 2   03/01/2015 Clinical Stage   Stage 0: Tis N0   04/07/2015 Definitive Surgery   Left mastectomy/SLNB Dalbert Batman) with placement of saline implant and reconstruction with acellular dermal matrix  [facility] Mares): DCIS with calcifications, ALH, LCIS, 0/2 lymph nodes negative, grade 2, tumor size 3 cm, margins 3 mm, ER+ (100%), PR+ (10%)    04/07/2015 Pathologic Stage   Stage 0: Tis N0   05/24/2015 -  Anti-estrogen oral therapy    tamoxifen 20 mg daily 5 years     08/26/2015 Survivorship   Survivorship care plan completed and mailed to patient in lieu of in person visit at her request   Breast cancer, left breast ([place])  04/07/2015 Initial Diagnosis   Breast cancer, left breast ([name])     CHIEF COMPLIANT: Follow-up of left breast DCIS on tamoxifen therapy  INTERVAL HISTORY: [name] is a 69 y.o. with above-mentioned history of left breast DCIS who underwent a lumpectomy and re-excision followed by mastectomy and who is currently on anti-estrogen therapy with tamoxifen. Mammogram on 02/25/20 showed no evidence of malignancy in the right breast. She presents to the clinic today for follow-up.   Tamoxifen has caused hot flashes and even uterine hypertrophy causing occasional spotting.  This is in spite of the 10 mg dosage.  ALLERGIES:  is allergic to latex and other.  MEDICATIONS:  Current Outpatient Medications  Medication Sig Dispense Refill  . amitriptyline (ELAVIL) 10 MG tablet Take 10 mg by mouth at bedtime as needed.     Marland Kitchen Apoaequorin (PREVAGEN EXTRA STRENGTH PO) Take 1 tablet by mouth daily.    . Ascorbic Acid (VITAMIN C) 1000 MG tablet Take 1,000 mg by mouth daily.    Marland Kitchen aspirin 81 MG chewable tablet Chew 81 mg by mouth daily.    Marland Kitchen atenolol (TENORMIN) 25 MG tablet Take 25 mg by mouth daily. (Patient not taking: Reported on 02/23/2020)    . atorvastatin (LIPITOR) 40 MG tablet Take 40 mg by mouth daily. (Patient not taking: Reported on 02/23/2020)    . Calcium Carb-Cholecalciferol (CALCIUM + VITAMIN D3 PO) Take 1,200 mg by mouth daily.    . Cholecalciferol (VITAMIN D3) 50  MCG (2000 UT) TABS Take 1 tablet by mouth daily.    Marland Kitchen glucosamine-chondroitin 500-400 MG tablet Take 2 tablets by mouth daily.    Marland Kitchen levothyroxine (SYNTHROID, LEVOTHROID) 50 MCG tablet Take 50 mcg by mouth daily.     Marland Kitchen losartan (COZAAR) 100 MG tablet Take 100 mg by mouth daily.     . Multiple Vitamins-Minerals (CENTRUM SILVER 50+WOMEN PO) Take 1 tablet by mouth daily.    .  Omega-3 Fatty Acids (OMEGA-3 FISH OIL) 300 MG CAPS Take 1 capsule by mouth daily.    Marland Kitchen omeprazole (PRILOSEC) 20 MG capsule Take 20 mg by mouth daily as needed.     . Probiotic CAPS Take 1 capsule by mouth every morning.    . tamoxifen (NOLVADEX) 10 MG tablet TAKE 1 TABLET EVERY DAY 90 tablet 0  . traZODone (DESYREL) 50 MG tablet Take 50 mg by mouth at bedtime as needed for sleep.    Marland Kitchen VITAMIN E PO Take by mouth daily.    Marland Kitchen zinc gluconate 50 MG tablet Take 50 mg by mouth daily.     No current facility-administered medications for this visit.    PHYSICAL EXAMINATION: ECOG PERFORMANCE STATUS: 1 - Symptomatic but completely ambulatory  Vitals:   06/16/20 1030  BP: (!) 142/85  Pulse: (!) 52  Resp: 18  Temp: 98.7 F (37.1 C)  SpO2: 100%   Filed Weights   06/16/20 1030  Weight: 180 lb 8 oz (81.9 kg)    BREAST: No palpable masses or nodules in either right or left breasts. No palpable axillary supraclavicular or infraclavicular adenopathy no breast tenderness or nipple discharge. (exam performed in the presence of a chaperone)  LABORATORY DATA:  I have reviewed the data as listed CMP Latest Ref Rng & Units 07/17/2019 06/07/2019 03/31/2016  Glucose 70 - 99 mg/dL 98 90 118(H)  BUN 8 - 23 mg/dL 9 16 6   Creatinine 0.44 - 1.00 mg/dL 0.80 0.82 0.90  Sodium 135 - 145 mmol/L 142 142 141  Potassium 3.5 - 5.1 mmol/L 4.4 3.7 4.0  Chloride 98 - 111 mmol/L 107 107 112(H)  CO2 22 - 32 mmol/L 26 25 24   Calcium 8.9 - 10.3 mg/dL 9.2 9.4 8.7(L)  Total Protein 6.5 - 8.1 g/dL - 6.7 -  Total Bilirubin 0.3 - 1.2 mg/dL - 1.4(H) -  Alkaline Phos 38 - 126 U/L - 51 -  AST 15 - 41 U/L - 30 -  ALT 0 - 44 U/L - 36 -    Lab Results  Component Value Date   WBC 9.1 07/17/2019   HGB 12.6 07/17/2019   HCT 38.8 07/17/2019   MCV 101.8 (H) 07/17/2019   PLT 286 07/17/2019   NEUTROABS 8.2 (H) 03/29/2016    ASSESSMENT & PLAN:  No problem-specific Assessment & Plan notes found for this encounter.    No  orders of the defined types were placed in this encounter.  The patient has a good understanding of the overall plan. she agrees with it. she will call with any problems that may develop before the next visit here.  Total time spent: 20 mins including face to face time and time spent for planning, charting and coordination of care  Nicholas Lose, MD 06/16/2020  I, Cloyde Reams Dorshimer, am acting as scribe for Dr. Nicholas Lose.  I have reviewed the above documentation for accuracy and completeness, and I agree with the above.

## 2020-06-16 ENCOUNTER — Other Ambulatory Visit: Payer: Self-pay

## 2020-06-16 ENCOUNTER — Inpatient Hospital Stay: Payer: Medicare HMO | Attending: Hematology and Oncology | Admitting: Hematology and Oncology

## 2020-06-16 DIAGNOSIS — Z9012 Acquired absence of left breast and nipple: Secondary | ICD-10-CM | POA: Insufficient documentation

## 2020-06-16 DIAGNOSIS — Z79810 Long term (current) use of selective estrogen receptor modulators (SERMs): Secondary | ICD-10-CM | POA: Insufficient documentation

## 2020-06-16 DIAGNOSIS — C50412 Malignant neoplasm of upper-outer quadrant of left female breast: Secondary | ICD-10-CM | POA: Diagnosis not present

## 2020-06-16 DIAGNOSIS — Z170 Estrogen receptor positive status [ER+]: Secondary | ICD-10-CM | POA: Diagnosis not present

## 2020-06-16 DIAGNOSIS — D0512 Intraductal carcinoma in situ of left breast: Secondary | ICD-10-CM | POA: Insufficient documentation

## 2020-06-16 NOTE — Assessment & Plan Note (Signed)
Left lumpectomy 01/04/2015: DCIS 3.3 cm, grade 2, ER 100%, PR 0%, margins were close underwent reexcision margins negative, repeat mammogram showed calcifications and biopsy showed DCIS   left mastectomy with immediate reconstruction 04/07/2015: DCIS with calcifications, ALH, LCIS, 0/2 lymph nodes negative, grade 2, tumor size 3 cm, margins 3 mm, ER 100%, PR 10% Tis N0 stage 0  Current treatment: 1. Adjuvant tamoxifen 20 mg daily 5 years started mid September 2016.  D&C uterus: Endometrial polyp 10/31/2015  Tamoxifen Toxicities: Hot flashes and night sweats.  She tried Effexor but she could not tolerate it and she discontinued it. Hospitalization 03/29/2016 to07/30/2017: Cellulitis of the chest wall, possible shingles Hospitalization 03/02/2016 to07/12/2015: Sepsis secondary to acute pancreatitis with early pseudocyst formation  She completed 5 years of tamoxifen therapy.  Therefore she will discontinue it at this time.   Breast Cancer Surveillance: 1. Breast exam 06/16/2020: Benign.  Left mastectomy with reconstruction severe tenderness in the left shoulder. 2. Mammogram 02/23/2020: Benign right breastBreast Density CategoryC    Return to clinic on an as-needed basis.

## 2020-06-20 ENCOUNTER — Telehealth: Payer: Self-pay | Admitting: Hematology and Oncology

## 2020-06-20 NOTE — Telephone Encounter (Signed)
No 10/14 los, no changes made to pt schedule  

## 2020-08-14 NOTE — Progress Notes (Deleted)
Cardiology Office Note:    Date:  08/14/2020   ID:  [name], DOB [birth year], MRN [id number]  PCP:  Ana Boston, MD  Cardiologist:  No primary care provider on file.  Electrophysiologist:  None   Referring MD: Ana Boston, MD   Chief Complaint/Reason for Referral: Chest pain follow up  History of Present Illness:    [name] is a 69 y.o. female with a history of HTN, hypothyroidism, osteopenia, breast cancer in 2016 with mastectomy and tamoxifen, and pancreatitis 2/2 to HCTZ. Presents for follow up.  PMD Dr. Jacalyn Lefevre notes normal lipids, I have reviewed CMP which is also stable in setting of starting atorvastatin.    Past Medical History:  Diagnosis Date  . Chronic shoulder bursitis    bilateral--- left > right  . GERD (gastroesophageal reflux disease)   . History of acute pancreatitis    03-02-2016  w/ sepsis-- resolved  . History of cardiac murmur in childhood   . History of cellulitis    03-29-2016  chest wall cellulitis w/ possible shingles, resolved  . Hypertension    followed by pcp  (07-15-2019 per pt never had stress test)  . Hypothyroidism    followed by pcp  . Ileitis    dx ED visit in epic 06-07-2019 (07-15-2019  per pt symptoms have resolved)  . Malignant neoplasm of upper-outer quadrant of left breast in female, estrogen receptor positive [facility]) oncologist-- dr [name]   dx 04/ 2016,  DCIS left breast, Stage 0, Grade 2,  ER+/ PR negative-- s/p  left lumpectomy 01-04-2015, re-excision 01-12-2015, left mastectomy w/ node dissection 04-07-2015 with reconstruction;  started tamoxifen 09/ 2016  . PMB (postmenopausal bleeding)   . PONV (postoperative nausea and vomiting)   . Wears glasses     Past Surgical History:  Procedure Laterality Date  . BREAST BIOPSY Left 12/08/2014; 01/2015  . BREAST LUMPECTOMY WITH RADIOACTIVE SEED LOCALIZATION Left 01/04/2015   Procedure: LEFT PARTIAL MASTECTOMY WITH RADIOACTIVE SEED LOCALIZATION;  Surgeon: Ana Skates,  MD;  Location: [place];  Service: General;  Laterality: Left;  . BREAST RECONSTRUCTION WITH PLACEMENT OF TISSUE EXPANDER AND FLEX HD (ACELLULAR HYDRATED DERMIS) Left 04/07/2015   Procedure: IMMEDIATE LEFT BREAST RECONSTRUCTION WITH PLACEMENT SALINE IMPLANT AND CHEST WALL RECONSTRUCTION WITH  ACELLULAR DERMAL MATRIX ;  Surgeon: Ana Reese, MD;  Location: [place];  Service: Plastics;  Laterality: Left;  . CATARACT EXTRACTION W/ INTRAOCULAR LENS  IMPLANT, BILATERAL  2005; 2017  . CHOLECYSTECTOMY  09/17/2011   Procedure: LAPAROSCOPIC CHOLECYSTECTOMY WITH INTRAOPERATIVE CHOLANGIOGRAM;  Surgeon: Ana Hector, MD;  Location: [place];  Service: General;  Laterality: N/A;  carm   . DILATATION & CURETTAGE/HYSTEROSCOPY WITH MYOSURE N/A 07/20/2019   Procedure: DILATATION & CURETTAGE/HYSTEROSCOPY WITH MYOSURE;  Surgeon: Ana Charles, MD;  Location: [place];  Service: Gynecology;  Laterality: N/A;  . HYSTEROSCOPY WITH D & C N/A 10/28/2015   Procedure: DILATATION AND CURETTAGE /HYSTEROSCOPY, Myosure;  Surgeon: Ana Hipp, MD;  Location: [facility];  Service: Gynecology;  Laterality: N/A;  . MASTECTOMY Left 2016  . NIPPLE SPARING MASTECTOMY/SENTINAL LYMPH NODE BIOPSY/RECONSTRUCTION/PLACEMENT OF TISSUE EXPANDER Left 04/07/2015   Procedure: LEFT NIPPLE SPARING MASTECTOMY WITH LEFT SENTINAL LYMPH NODE BIOPSY AND  RECONSTRUCTION WITH PLACEMENT OF TISSUE EXPANDER;  Surgeon: Ana Skates, MD;  Location: [place];  Service: General;  Laterality: Left;  . RE-EXCISION OF BREAST LUMPECTOMY Left 01/12/2015   Procedure: LEFT  BREAST LUMPECTOMY RE-EXCISION OF MARGINS;  Surgeon: Ana Skates, MD;  Location: [place];  Service: General;  Laterality: Left;    Current Medications: No outpatient medications have been marked as taking for the 08/15/20 encounter (Appointment) with Elouise Munroe, MD.     Allergies:   Latex and Other   Social History   Tobacco Use  . Smoking status: Former  Smoker    Packs/day: 0.50    Years: 46.00    Pack years: 23.00    Types: Cigarettes    Quit date: 11/25/2014    Years since quitting: 5.7  . Smokeless tobacco: Never Used  Vaping Use  . Vaping Use: Never used  Substance Use Topics  . Alcohol use: Yes    Comment: very rare  . Drug use: Never     Family History: The patient's family history includes Breast cancer in her maternal aunt; Cancer in her brother; Colon cancer in an other family member; Heart disease in her father and mother; Hypertension in her mother; Leukemia in her father.  ROS:   Please see the history of present illness.    All other systems reviewed and are negative.  EKGs/Labs/Other Studies Reviewed:    The following studies were reviewed today:  EKG:  ***  I have independently reviewed the images from ***.  Recent Labs: No results found for requested labs within last 8760 hours.  Recent Lipid Panel    Component Value Date/Time   CHOL 138 03/02/2016 1040   TRIG 94 03/02/2016 1040   HDL 53 03/02/2016 1040   CHOLHDL 2.6 03/02/2016 1040   VLDL 19 03/02/2016 1040   LDLCALC 66 03/02/2016 1040    Physical Exam:    VS:  There were no vitals taken for this visit.    Wt Readings from Last 5 Encounters:  06/16/20 180 lb 8 oz (81.9 kg)  02/23/20 175 lb (79.4 kg)  01/27/20 180 lb (81.6 kg)  01/20/20 180 lb 6.4 oz (81.8 kg)  07/20/19 185 lb 2 oz (84 kg)    Constitutional: No acute distress Eyes: sclera non-icteric, normal conjunctiva and lids ENMT: normal dentition, moist mucous membranes Cardiovascular: regular rhythm, normal rate, no murmurs. S1 and S2 normal. Radial pulses normal bilaterally. No jugular venous distention.  Respiratory: clear to auscultation bilaterally GI : normal bowel sounds, soft and nontender. No distention.   MSK: extremities warm, well perfused. No edema.  NEURO: grossly nonfocal exam, moves all extremities. PSYCH: alert and oriented x 3, normal mood and affect.    ASSESSMENT:    No diagnosis found. PLAN:    No diagnosis found.  Total time of encounter: *** minutes total time of encounter, including *** minutes spent in face-to-face patient care on the date of this encounter. This time includes coordination of care and counseling regarding above mentioned problem list. Remainder of non-face-to-face time involved reviewing chart documents/testing relevant to the patient encounter and documentation in the medical record. I have independently reviewed documentation from referring provider.   Cherlynn Kaiser, MD [place]  [facility]    Medication Adjustments/Labs and Tests Ordered: Current medicines are reviewed at length with the patient today.  Concerns regarding medicines are outlined above.   No orders of the defined types were placed in this encounter.   Shared Decision Making/Informed Consent:   {Are you ordering a CV Procedure (e.g. stress test, cath, DCCV, TEE, etc)?   Press F2        :[id number]}   No orders of the defined types were placed in this encounter.  There are no Patient Instructions on file for this visit.

## 2020-08-15 ENCOUNTER — Ambulatory Visit: Payer: Medicare HMO | Admitting: Internal Medicine

## 2020-09-21 ENCOUNTER — Ambulatory Visit: Payer: Medicare HMO | Admitting: Internal Medicine

## 2020-09-26 IMAGING — MG DIGITAL SCREENING UNILAT RIGHT W/ TOMO W/ CAD
4 series · 4 of 12 positions shown · non-contrast
Comparison: Previous exam(s).

CLINICAL DATA: Screening.

EXAM:
DIGITAL SCREENING UNILATERAL RIGHT MAMMOGRAM WITH CAD AND TOMO

[R CC synth-2D]
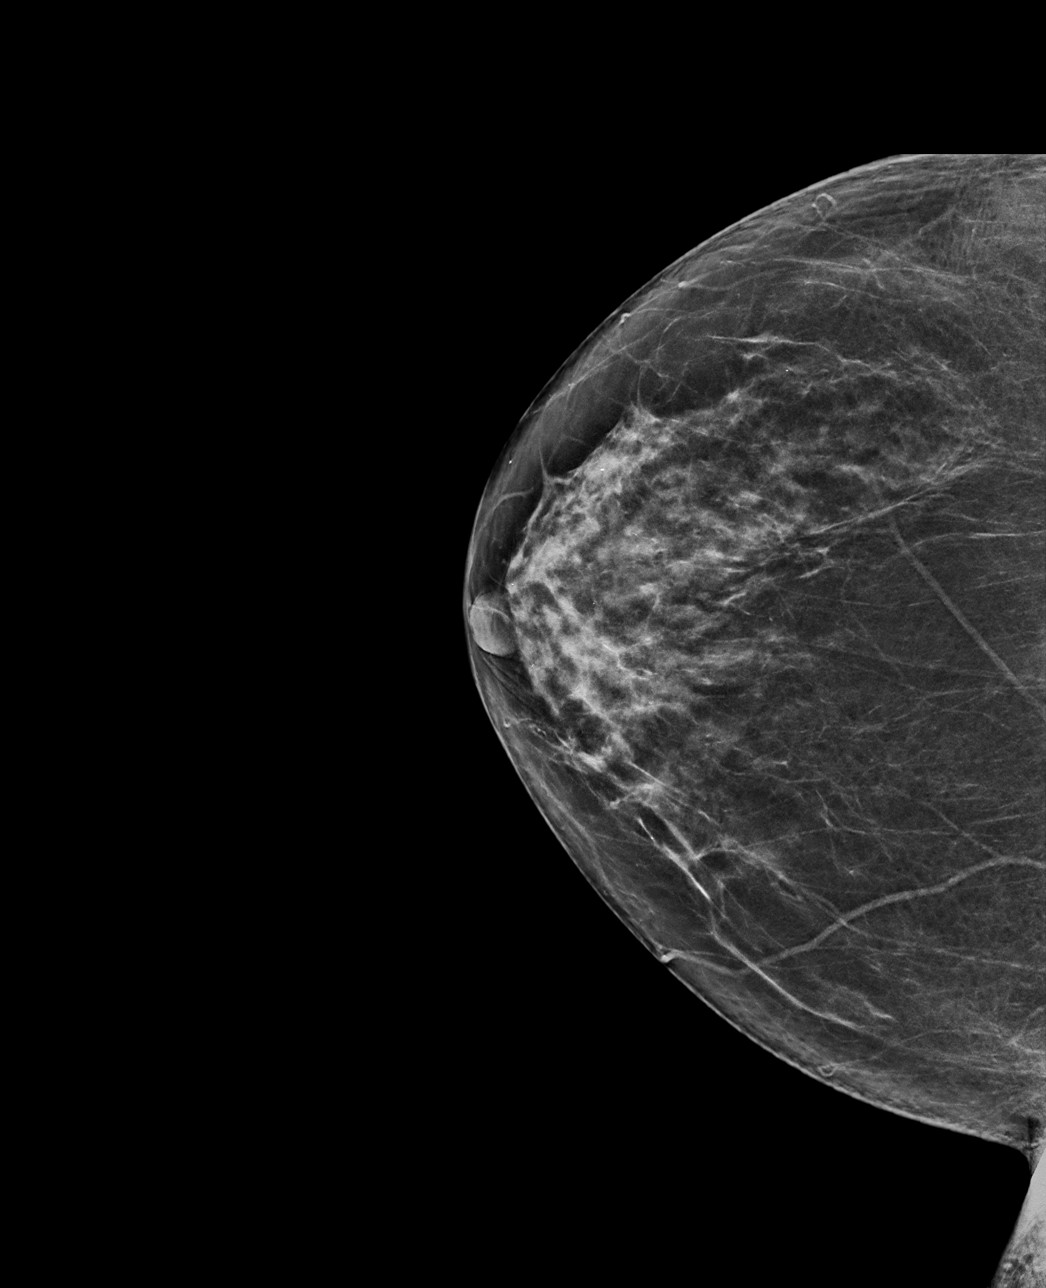

[R MLO synth-2D]
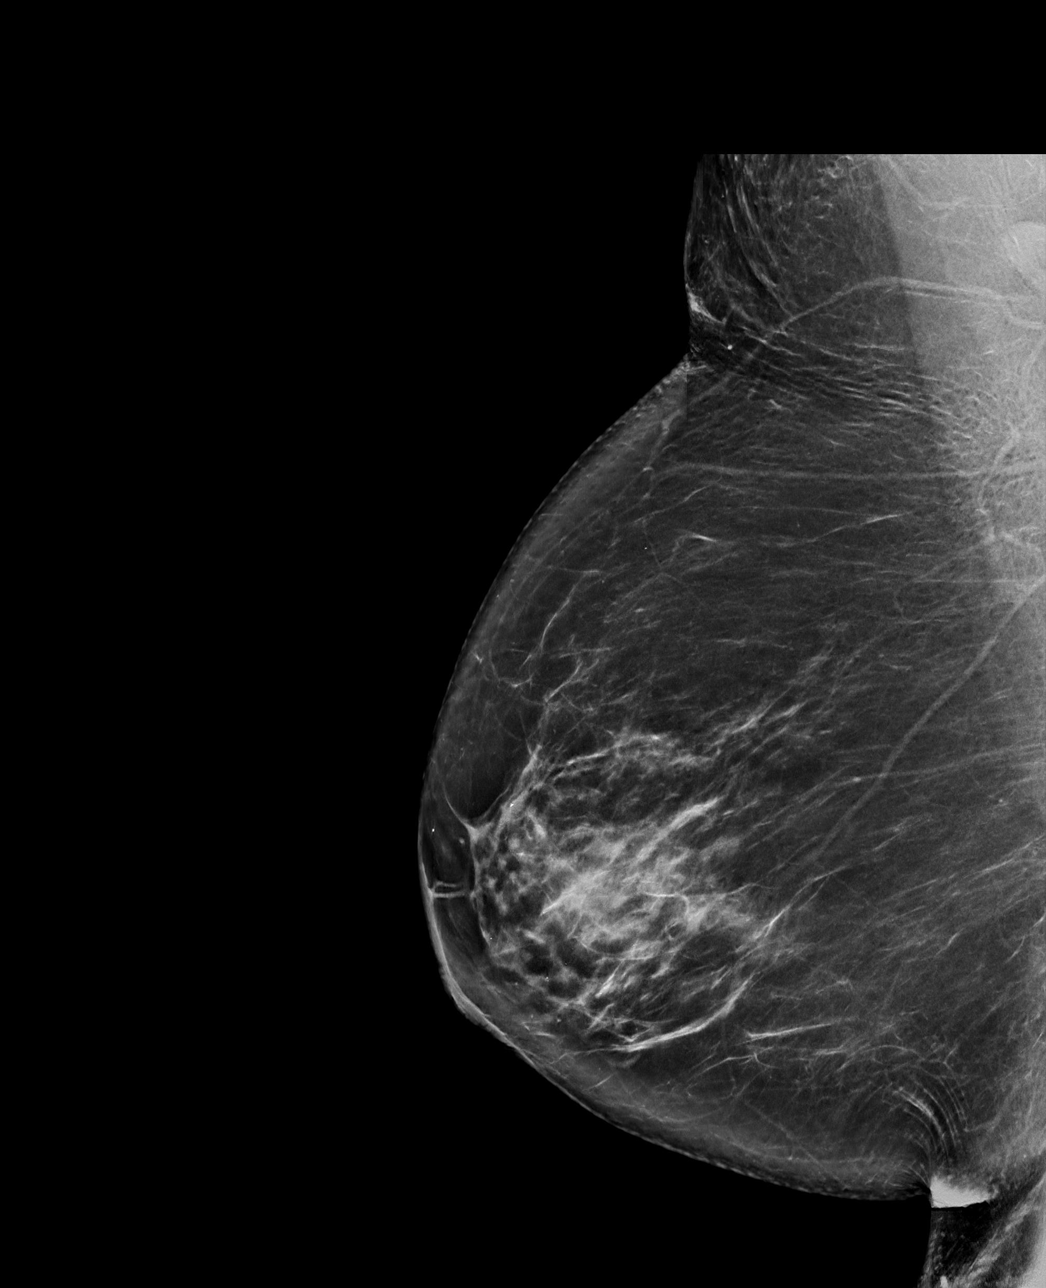

[R CC tomo · tomo slice 35/70.0]
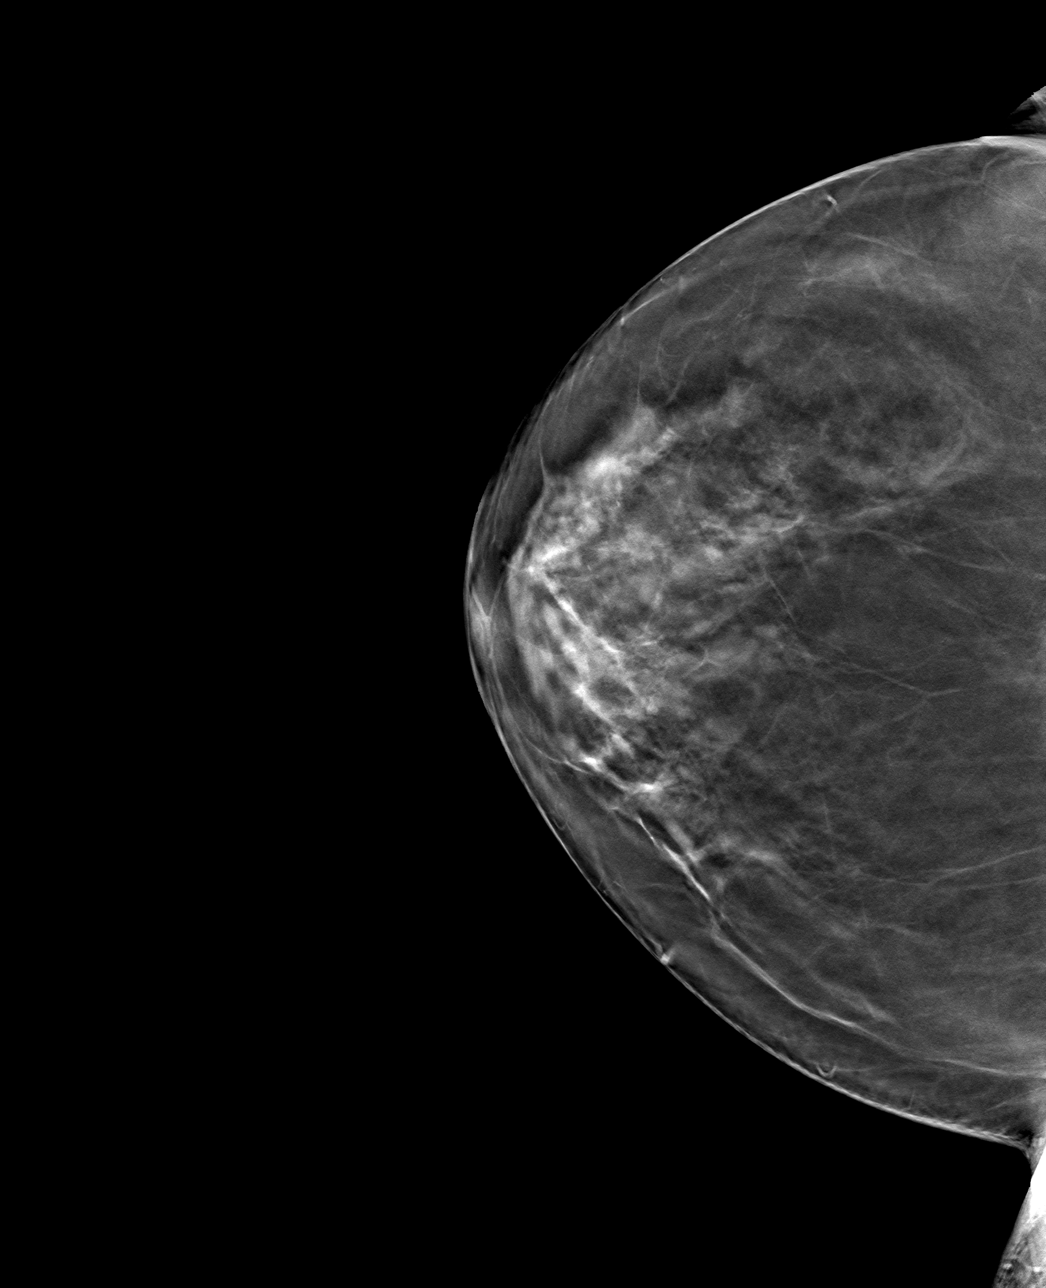

[R MLO tomo · tomo slice 43/86.0]
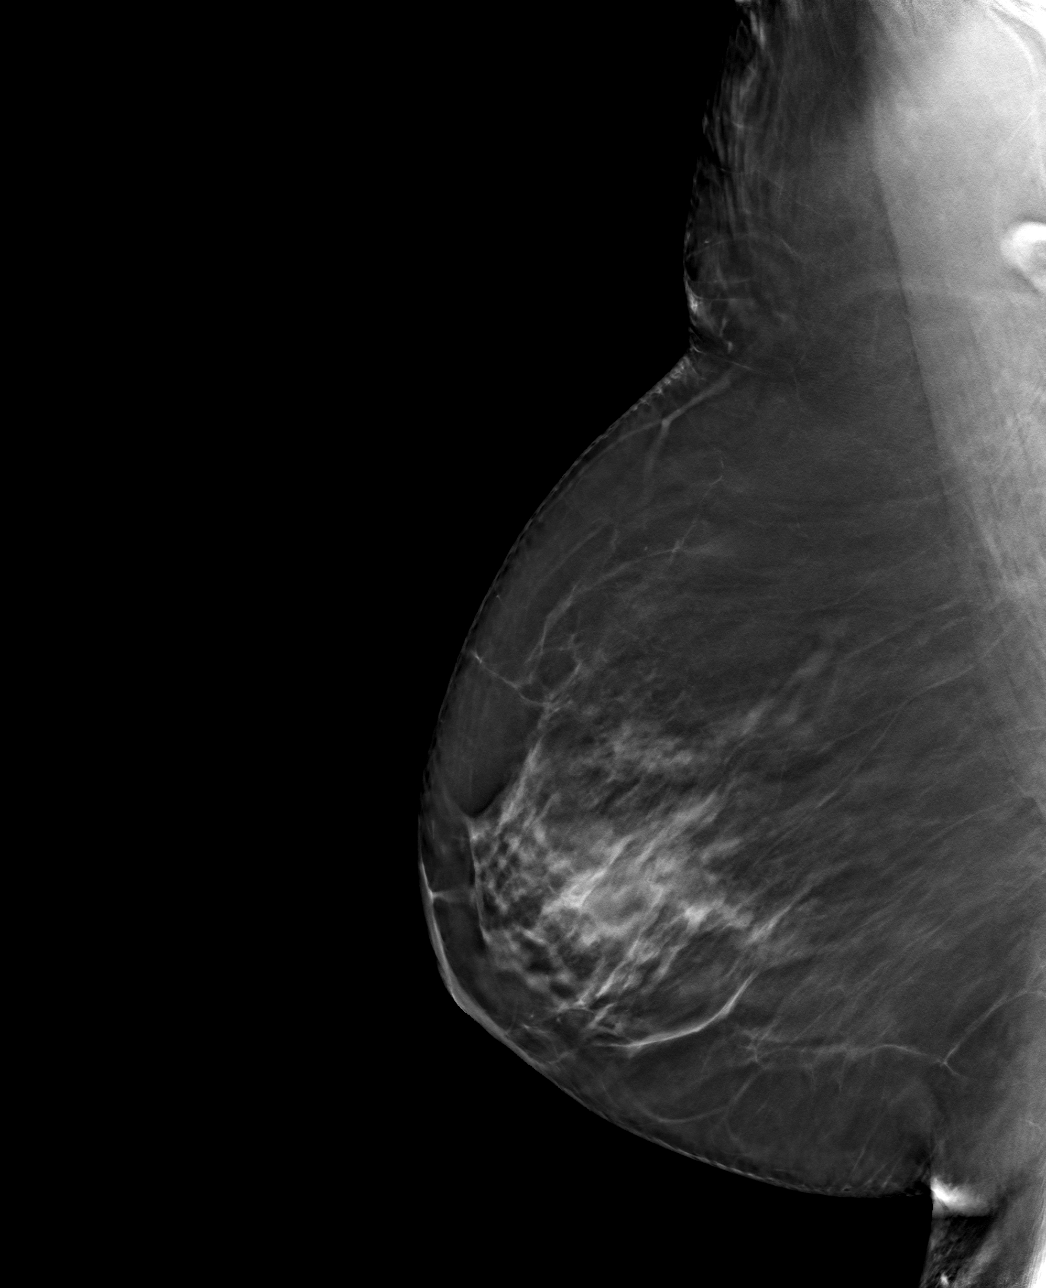

[4 of 12 positions shown; findings below may reference images not displayed]

ACR Breast Density Category c: The breast tissue is heterogeneously
dense, which may obscure small masses.
FINDINGS: There are no findings suspicious for malignancy. Images were
processed with CAD.
IMPRESSION: No mammographic evidence of malignancy. A result letter of this
screening mammogram will be mailed directly to the patient.

RECOMMENDATION:
Screening mammogram in one year. (Code:[id number])

BI-RADS CATEGORY  1: Negative.

## 2021-01-05 ENCOUNTER — Encounter: Payer: Self-pay | Admitting: Internal Medicine

## 2021-01-12 ENCOUNTER — Other Ambulatory Visit: Payer: Self-pay | Admitting: Internal Medicine

## 2021-01-12 DIAGNOSIS — Z1231 Encounter for screening mammogram for malignant neoplasm of breast: Secondary | ICD-10-CM

## 2021-01-17 DIAGNOSIS — M859 Disorder of bone density and structure, unspecified: Secondary | ICD-10-CM | POA: Diagnosis not present

## 2021-01-17 DIAGNOSIS — E785 Hyperlipidemia, unspecified: Secondary | ICD-10-CM | POA: Diagnosis not present

## 2021-01-24 DIAGNOSIS — E785 Hyperlipidemia, unspecified: Secondary | ICD-10-CM | POA: Diagnosis not present

## 2021-01-24 DIAGNOSIS — I700 Atherosclerosis of aorta: Secondary | ICD-10-CM | POA: Diagnosis not present

## 2021-01-24 DIAGNOSIS — I10 Essential (primary) hypertension: Secondary | ICD-10-CM | POA: Diagnosis not present

## 2021-01-24 DIAGNOSIS — E039 Hypothyroidism, unspecified: Secondary | ICD-10-CM | POA: Diagnosis not present

## 2021-01-24 DIAGNOSIS — Z1331 Encounter for screening for depression: Secondary | ICD-10-CM | POA: Diagnosis not present

## 2021-01-24 DIAGNOSIS — Z1389 Encounter for screening for other disorder: Secondary | ICD-10-CM | POA: Diagnosis not present

## 2021-01-24 DIAGNOSIS — F439 Reaction to severe stress, unspecified: Secondary | ICD-10-CM | POA: Diagnosis not present

## 2021-01-24 DIAGNOSIS — Z0000 Encounter for general adult medical examination without abnormal findings: Secondary | ICD-10-CM | POA: Diagnosis not present

## 2021-01-24 DIAGNOSIS — M8580 Other specified disorders of bone density and structure, unspecified site: Secondary | ICD-10-CM | POA: Diagnosis not present

## 2021-01-24 DIAGNOSIS — G4700 Insomnia, unspecified: Secondary | ICD-10-CM | POA: Diagnosis not present

## 2021-01-24 DIAGNOSIS — R1013 Epigastric pain: Secondary | ICD-10-CM | POA: Diagnosis not present

## 2021-01-27 ENCOUNTER — Other Ambulatory Visit: Payer: Self-pay | Admitting: Internal Medicine

## 2021-01-27 DIAGNOSIS — Z87891 Personal history of nicotine dependence: Secondary | ICD-10-CM

## 2021-02-08 ENCOUNTER — Ambulatory Visit
Admission: RE | Admit: 2021-02-08 | Discharge: 2021-02-08 | Disposition: A | Discharge status: Home / Self Care | Payer: Medicare HMO | Source: Ambulatory Visit | Attending: Internal Medicine | Admitting: Internal Medicine

## 2021-02-08 DIAGNOSIS — I700 Atherosclerosis of aorta: Secondary | ICD-10-CM | POA: Diagnosis not present

## 2021-02-08 DIAGNOSIS — Z87891 Personal history of nicotine dependence: Secondary | ICD-10-CM

## 2021-02-08 DIAGNOSIS — J439 Emphysema, unspecified: Secondary | ICD-10-CM | POA: Diagnosis not present

## 2021-02-08 DIAGNOSIS — I2510 Atherosclerotic heart disease of native coronary artery without angina pectoris: Secondary | ICD-10-CM | POA: Diagnosis not present

## 2021-02-14 ENCOUNTER — Encounter: Payer: Self-pay | Admitting: Gastroenterology

## 2021-02-22 DIAGNOSIS — R8290 Unspecified abnormal findings in urine: Secondary | ICD-10-CM | POA: Diagnosis not present

## 2021-02-24 DIAGNOSIS — H5212 Myopia, left eye: Secondary | ICD-10-CM | POA: Diagnosis not present

## 2021-03-01 ENCOUNTER — Encounter: Payer: Self-pay | Admitting: Internal Medicine

## 2021-03-01 ENCOUNTER — Other Ambulatory Visit: Payer: Self-pay

## 2021-03-01 ENCOUNTER — Ambulatory Visit: Payer: Medicare HMO | Admitting: Internal Medicine

## 2021-03-01 VITALS — BP 118/78 | HR 57 | Resp 18 | Ht 63.0 in | Wt 190.0 lb

## 2021-03-01 DIAGNOSIS — I340 Nonrheumatic mitral (valve) insufficiency: Secondary | ICD-10-CM

## 2021-03-01 DIAGNOSIS — I10 Essential (primary) hypertension: Secondary | ICD-10-CM

## 2021-03-01 DIAGNOSIS — I700 Atherosclerosis of aorta: Secondary | ICD-10-CM

## 2021-03-01 DIAGNOSIS — R079 Chest pain, unspecified: Secondary | ICD-10-CM | POA: Diagnosis not present

## 2021-03-01 DIAGNOSIS — C50912 Malignant neoplasm of unspecified site of left female breast: Secondary | ICD-10-CM

## 2021-03-01 DIAGNOSIS — E039 Hypothyroidism, unspecified: Secondary | ICD-10-CM

## 2021-03-01 NOTE — Patient Instructions (Signed)
Medication Instructions:  No Changes In Medications at this time.  *If you need a refill on your cardiac medications before your next appointment, please call your pharmacy*  Testing/Procedures: Your physician has requested that you have an echocardiogram IN ONE YEAR. Echocardiography is a painless test that uses sound waves to create images of your heart. It provides your doctor with information about the size and shape of your heart and how well your heart's chambers and valves are working. You may receive an ultrasound enhancing agent through an IV if needed to better visualize your heart during the echo.This procedure takes approximately one hour. There are no restrictions for this procedure. This will take place at the [address].   Follow-Up: At [facility], you and your health needs are our priority.  As part of our continuing mission to provide you with exceptional heart care, we have created designated Provider Care Teams.  These Care Teams include your primary Cardiologist (physician) and Advanced Practice Providers (APPs -  Physician Assistants and Nurse Practitioners) who all work together to provide you with the care you need, when you need it.  Your next appointment:   1 year(s)  The format for your next appointment:   In Person  Provider:   Cherlynn Kaiser, MD

## 2021-03-01 NOTE — Progress Notes (Signed)
Cardiology Office Note   Date:  03/01/2021   ID:  Tylor, Gambrill 02-24-1951, MRN [id number]  PCP:  Michael Boston, MD  Cardiologist:  None  Electrophysiologist:  None   Evaluation Performed:  Follow-Up Visit  Chief Complaint:  Chest pain  History of Present Illness:    [name] is a 70 y.o. female with HTN, hypothyroidism, osteopenia, breast cancer in 2016 with mastectomy and tamoxifen, and pancreatitis 2/2 to HCTZ.   Today, she is feeling good aside from feeling stressed. She is grieving the loss of her mother. She is also feeling some chest discomfort/pressure that she attributes to this. The chest pressure occurs a few times a month.  Also, she has left UE pain radiating throughout her arm, which has developed in the past couple of days. She believes this may be due to arthritis in her shoulder. She is experiencing some left upper back pain. Of note, she also has right abdominal pain that she describes as "twisting" pain. Previously she underwent a cholecystectomy.   Typically her blood pressure at home averages around 136/70. Her most recent labs (01/2021) show her LDL is 28.  She remains compliant with her medications. We discussed the option of using nitroglycerin for her chest discomfort, but she wishes to defer new medications at this time.  She endorses hematuria. Currently, she is not sure of the cause and is seeking out a nephrologist.  She denies any shortness of breath, palpitations, or exertional symptoms. No headaches, lightheadedness, or syncope to report. Also has no lower extremity edema, orthopnea or PND.  Past Medical History:  Diagnosis Date   Chronic shoulder bursitis    bilateral--- left > right   GERD (gastroesophageal reflux disease)    History of acute pancreatitis    03-02-2016  w/ sepsis-- resolved   History of cardiac murmur in childhood    History of cellulitis    03-29-2016  chest wall cellulitis w/ possible shingles, resolved    Hypertension    followed by pcp  (07-15-2019 per pt never had stress test)   Hypothyroidism    followed by pcp   Ileitis    dx ED visit in epic 06-07-2019 (07-15-2019  per pt symptoms have resolved)   Malignant neoplasm of upper-outer quadrant of left breast in female, estrogen receptor positive [facility]) oncologist-- dr Lindi Adie   dx 04/ 2016,  DCIS left breast, Stage 0, Grade 2,  ER+/ PR negative-- s/p  left lumpectomy 01-04-2015, re-excision 01-12-2015, left mastectomy w/ node dissection 04-07-2015 with reconstruction;  started tamoxifen 09/ 2016   PMB (postmenopausal bleeding)    PONV (postoperative nausea and vomiting)    Wears glasses    Past Surgical History:  Procedure Laterality Date   BREAST BIOPSY Left 12/08/2014; 01/2015   BREAST LUMPECTOMY WITH RADIOACTIVE SEED LOCALIZATION Left 01/04/2015   Procedure: LEFT PARTIAL MASTECTOMY WITH RADIOACTIVE SEED LOCALIZATION;  Surgeon: Fanny Skates, MD;  Location: [place];  Service: General;  Laterality: Left;   BREAST RECONSTRUCTION WITH PLACEMENT OF TISSUE EXPANDER AND FLEX HD (ACELLULAR HYDRATED DERMIS) Left 04/07/2015   Procedure: IMMEDIATE LEFT BREAST RECONSTRUCTION WITH PLACEMENT SALINE IMPLANT AND CHEST WALL RECONSTRUCTION WITH  ACELLULAR DERMAL MATRIX ;  Surgeon: Crissie Reese, MD;  Location: [place];  Service: Plastics;  Laterality: Left;   CATARACT EXTRACTION W/ INTRAOCULAR LENS  IMPLANT, BILATERAL  2005; 2017   CHOLECYSTECTOMY  09/17/2011   Procedure: LAPAROSCOPIC CHOLECYSTECTOMY WITH INTRAOPERATIVE CHOLANGIOGRAM;  Surgeon: [name], MD;  Location: [place];  Service: General;  Laterality: N/A;  carm    DILATATION & CURETTAGE/HYSTEROSCOPY WITH MYOSURE N/A 07/20/2019   Procedure: DILATATION & CURETTAGE/HYSTEROSCOPY WITH MYOSURE;  Surgeon: Jerelyn Charles, MD;  Location: [place];  Service: Gynecology;  Laterality: N/A;   HYSTEROSCOPY WITH D & C N/A 10/28/2015   Procedure: DILATATION AND CURETTAGE /HYSTEROSCOPY, Myosure;   Surgeon: Alden Hipp, MD;  Location: [facility];  Service: Gynecology;  Laterality: N/A;   MASTECTOMY Left 2016   NIPPLE SPARING MASTECTOMY/SENTINAL LYMPH NODE BIOPSY/RECONSTRUCTION/PLACEMENT OF TISSUE EXPANDER Left 04/07/2015   Procedure: LEFT NIPPLE SPARING MASTECTOMY WITH LEFT SENTINAL LYMPH NODE BIOPSY AND  RECONSTRUCTION WITH PLACEMENT OF TISSUE EXPANDER;  Surgeon: Fanny Skates, MD;  Location: [place];  Service: General;  Laterality: Left;   RE-EXCISION OF BREAST LUMPECTOMY Left 01/12/2015   Procedure: LEFT  BREAST LUMPECTOMY RE-EXCISION OF MARGINS;  Surgeon: Fanny Skates, MD;  Location: [place];  Service: General;  Laterality: Left;     Current Meds  Medication Sig   amitriptyline (ELAVIL) 10 MG tablet Take 10 mg by mouth at bedtime as needed.    Ascorbic Acid (VITAMIN C) 1000 MG tablet Take 1,000 mg by mouth daily.   aspirin 81 MG chewable tablet Chew 81 mg by mouth daily.   atenolol (TENORMIN) 25 MG tablet Take 25 mg by mouth daily.   atorvastatin (LIPITOR) 40 MG tablet Take 40 mg by mouth daily.   Calcium Carb-Cholecalciferol (CALCIUM + VITAMIN D3 PO) Take 1,200 mg by mouth daily.   Cholecalciferol (VITAMIN D3) 50 MCG (2000 UT) TABS Take 1 tablet by mouth daily.   glucosamine-chondroitin 500-400 MG tablet Take 2 tablets by mouth daily.   levothyroxine (SYNTHROID, LEVOTHROID) 50 MCG tablet Take 50 mcg by mouth daily.    losartan (COZAAR) 100 MG tablet Take 100 mg by mouth daily.    Multiple Vitamins-Minerals (CENTRUM SILVER 50+WOMEN PO) Take 1 tablet by mouth daily.   Omega-3 Fatty Acids (OMEGA-3 FISH OIL) 300 MG CAPS Take 1 capsule by mouth daily.   omeprazole (PRILOSEC) 40 MG capsule Take 40 mg by mouth daily.   Probiotic CAPS Take 1 capsule by mouth every morning.   traZODone (DESYREL) 50 MG tablet Take 50 mg by mouth at bedtime as needed for sleep.   VITAMIN E PO Take by mouth daily.   zinc gluconate 50 MG tablet Take 50 mg by mouth daily.     Allergies:    Latex and Other   Social History   Tobacco Use   Smoking status: Former    Packs/day: 0.50    Years: 46.00    Pack years: 23.00    Types: Cigarettes    Quit date: 11/25/2014    Years since quitting: 6.2   Smokeless tobacco: Never  Vaping Use   Vaping Use: Never used  Substance Use Topics   Alcohol use: Yes    Comment: very rare   Drug use: Never     Family Hx: The patient's family history includes Breast cancer in her maternal aunt; Cancer in her brother; Colon cancer in an other family member; Heart disease in her father and mother; Hypertension in her mother; Leukemia in her father.  ROS:   Please see the history of present illness.    (+) Stress (+) Chest discomfort (+) Left UE pain (+) Left upper back pain (+) Right abdominal pain All other systems reviewed and are negative.   Prior CV studies:   The following studies were reviewed today:  Echo  02/11/2020: 1. Left ventricular ejection fraction, by estimation, is 60 to 65%. The  left ventricle has normal function. The left ventricle has no regional  wall motion abnormalities. Left ventricular diastolic parameters were  normal. The average left ventricular  global longitudinal strain is -24.6 %.   2. Right ventricular systolic function is normal. The right ventricular  size is normal.   3. The mitral valve is grossly normal. Mild mitral valve regurgitation.  No evidence of mitral stenosis.   4. The aortic valve is normal in structure. Aortic valve regurgitation is  not visualized. No aortic stenosis is present.  Lexiscan Myoview 01/27/2020: The left ventricular ejection fraction is hyperdynamic (>65%). Nuclear stress EF: 66%. There was no ST segment deviation noted during stress. The study is normal. This is a low risk study.   Labs/Other Tests and Data Reviewed:    EKG:  03/01/2021: Sinus bradycardia with sinus arrhythmia, rate 57 bpm 02/23/2020: EKG was not ordered.  Recent Labs: No results found for  requested labs within last 8760 hours.   Recent Lipid Panel Lab Results  Component Value Date/Time   CHOL 138 03/02/2016 10:40 AM   TRIG 94 03/02/2016 10:40 AM   HDL 53 03/02/2016 10:40 AM   CHOLHDL 2.6 03/02/2016 10:40 AM   LDLCALC 66 03/02/2016 10:40 AM    Wt Readings from Last 3 Encounters:  06/16/20 180 lb 8 oz (81.9 kg)  02/23/20 175 lb (79.4 kg)  01/27/20 180 lb (81.6 kg)     Objective:    Vital Signs:  BP 118/78 (BP Location: Left Arm, Patient Position: Sitting, Cuff Size: Normal)   Pulse (!) 57   Resp 18   Ht 5\' 3"  (1.6 m)   Wt 190 lb (86.2 kg)   SpO2 95%   BMI 33.66 kg/m    Constitutional: No acute distress Eyes: sclera non-icteric, normal conjunctiva and lids ENMT: normal dentition, moist mucous membranes Cardiovascular: regular rhythm, normal rate, no murmurs. S1 and S2 normal. Radial pulses normal bilaterally. No jugular venous distention.  Respiratory: clear to auscultation bilaterally GI : normal bowel sounds, soft and nontender. No distention.   MSK: extremities warm, well perfused. No edema.  NEURO: grossly nonfocal exam, moves all extremities. PSYCH: alert and oriented x 3, normal mood and affect.   ASSESSMENT & PLAN:    1. Mitral valve insufficiency, unspecified etiology   2. Chest pain, unspecified type   3. Essential hypertension   4. Malignant neoplasm of left female breast, unspecified estrogen receptor status, unspecified site of breast ([place])   5. Hypothyroidism, unspecified type   6. Aortic atherosclerosis ([place])    Mild MR - will repeat an echo next year to ensure no worsening.  Chest pain - stable with mild infrequent symptoms. Discussed PRN nitro, she will consider but would prefer no new meds at this time.    HTN - on atenolol per pcp, can continue. Continue losartan. BP well controlled.  Hx of breast cancer - normal echo with normal strain.   Aortic atherosclerosis - started on statin, doing well with excellent lipid  control.  Total time of encounter: 30 minutes total time of encounter, including 20 minutes spent in face-to-face patient care on the date of this encounter. This time includes coordination of care and counseling regarding above mentioned problem list. Remainder of non-face-to-face time involved reviewing chart documents/testing relevant to the patient encounter and documentation in the medical record. I have independently reviewed documentation from referring provider.   Cherlynn Kaiser, MD, [facility]  [place] This Encounter  Procedures   EKG 12-Lead   ECHOCARDIOGRAM COMPLETE      Patient Instructions  Medication Instructions:  No Changes In Medications at this time.  *If you need a refill on your cardiac medications before your next appointment, please call your pharmacy*  Testing/Procedures: Your physician has requested that you have an echocardiogram IN ONE YEAR. Echocardiography is a painless test that uses sound waves to create images of your heart. It provides your doctor with information about the size and shape of your heart and how well your heart's chambers and valves are working. You may receive an ultrasound enhancing agent through an IV if needed to better visualize your heart during the echo.This procedure takes approximately one hour. There are no restrictions for this procedure. This will take place at the [address], [address].   Follow-Up: At [facility], you and your health needs are our priority.  As part of our continuing mission to provide you with exceptional heart care, we have created designated Provider Care Teams.  These Care Teams include your primary Cardiologist (physician) and Advanced Practice Providers (APPs -  Physician Assistants and Nurse Practitioners) who all work together to provide you with the care you need, when you need it.  Your next appointment:   1 year(s)  The format for your next appointment:   In  Person  Provider:   Cherlynn Kaiser, MD   [facility],acting as a scribe for Elouise Munroe, MD.,have documented all relevant documentation on the behalf of Elouise Munroe, MD,as directed by  Elouise Munroe, MD while in the presence of Elouise Munroe, MD.  I, Elouise Munroe, MD, have reviewed all documentation for this visit. The documentation on 03/01/21 for the exam, diagnosis, procedures, and orders are all accurate and complete.

## 2021-03-08 ENCOUNTER — Other Ambulatory Visit: Payer: Self-pay

## 2021-03-08 ENCOUNTER — Ambulatory Visit
Admission: RE | Admit: 2021-03-08 | Discharge: 2021-03-08 | Disposition: A | Discharge status: Home / Self Care | Payer: Medicare HMO | Source: Ambulatory Visit | Attending: Internal Medicine | Admitting: Internal Medicine

## 2021-03-08 DIAGNOSIS — Z1231 Encounter for screening mammogram for malignant neoplasm of breast: Secondary | ICD-10-CM

## 2021-03-15 ENCOUNTER — Ambulatory Visit: Payer: Medicare HMO | Admitting: Gastroenterology

## 2021-03-15 ENCOUNTER — Encounter: Payer: Self-pay | Admitting: Gastroenterology

## 2021-03-15 VITALS — BP 148/84 | HR 59 | Ht 63.0 in | Wt 189.8 lb

## 2021-03-15 DIAGNOSIS — R140 Abdominal distension (gaseous): Secondary | ICD-10-CM | POA: Diagnosis not present

## 2021-03-15 DIAGNOSIS — R1033 Periumbilical pain: Secondary | ICD-10-CM

## 2021-03-15 DIAGNOSIS — K859 Acute pancreatitis without necrosis or infection, unspecified: Secondary | ICD-10-CM | POA: Diagnosis not present

## 2021-03-15 DIAGNOSIS — R197 Diarrhea, unspecified: Secondary | ICD-10-CM | POA: Diagnosis not present

## 2021-03-15 MED ORDER — DICYCLOMINE HCL 10 MG PO CAPS: 10.0000 mg | ORAL_CAPSULE | Freq: Two times a day (BID) | ORAL | 1 refills | Status: AC

## 2021-03-15 MED ORDER — SUTAB 1479-225-188 MG PO TABS: 1.0000 | ORAL_TABLET | ORAL | 0 refills | Status: DC

## 2021-03-15 NOTE — Progress Notes (Signed)
03/15/2021 [name] [id number] Feb 11, 1951   HISTORY OF PRESENT ILLNESS: This is a 70 year old female who is a patient of Dr. Blanch Media.  She is here today with complaints of abdominal pain, bloating, and diarrhea that has been present for the past 3 months.  She tells me that she is getting really severe right/mid abdominal pain that goes to her upper back.  She complains of a lot of bloating and gas.  She says that it seemed to settle down for a little bit and then she started having loose stools/diarrhea.  She complains of the pain in her upper abdomen as a burning/pins-and-needles type of pain.  She says that it gets very tight.  She had an episode of pancreatitis back in 2017 that was thought to be due to the hydrochlorothiazide that she was on.  Has not had any other issues with pancreatitis since that time.  She had extensive labs performed back in May when she was seen by her PCP for the symptoms.  CBC, CMP, amylase, lipase were all fairly unremarkable.  She did have a total bilirubin of 1.6, but other LFTs were normal.  Her PCP placed her on omeprazole 40 mg daily 2 to 3 weeks ago.  Colonoscopy April 2012 by Dr. Sharlett Iles was normal with repeat recommended in 10 years.   Past Medical History:  Diagnosis Date   Chronic shoulder bursitis    bilateral--- left > right   GERD (gastroesophageal reflux disease)    History of acute pancreatitis    03-02-2016  w/ sepsis-- resolved   History of cardiac murmur in childhood    History of cellulitis    03-29-2016  chest wall cellulitis w/ possible shingles, resolved   Hypertension    followed by pcp  (07-15-2019 per pt never had stress test)   Hypothyroidism    followed by pcp   Ileitis    dx ED visit in epic 06-07-2019 (07-15-2019  per pt symptoms have resolved)   Malignant neoplasm of upper-outer quadrant of left breast in female, estrogen receptor positive [facility]) oncologist-- dr Lindi Adie   dx 04/ 2016,  DCIS left breast, Stage 0,  Grade 2,  ER+/ PR negative-- s/p  left lumpectomy 01-04-2015, re-excision 01-12-2015, left mastectomy w/ node dissection 04-07-2015 with reconstruction;  started tamoxifen 09/ 2016   PMB (postmenopausal bleeding)    PONV (postoperative nausea and vomiting)    Wears glasses    Past Surgical History:  Procedure Laterality Date   BREAST BIOPSY Left 12/08/2014; 01/2015   BREAST LUMPECTOMY WITH RADIOACTIVE SEED LOCALIZATION Left 01/04/2015   Procedure: LEFT PARTIAL MASTECTOMY WITH RADIOACTIVE SEED LOCALIZATION;  Surgeon: Fanny Skates, MD;  Location: [place];  Service: General;  Laterality: Left;   BREAST RECONSTRUCTION WITH PLACEMENT OF TISSUE EXPANDER AND FLEX HD (ACELLULAR HYDRATED DERMIS) Left 04/07/2015   Procedure: IMMEDIATE LEFT BREAST RECONSTRUCTION WITH PLACEMENT SALINE IMPLANT AND CHEST WALL RECONSTRUCTION WITH  ACELLULAR DERMAL MATRIX ;  Surgeon: Crissie Reese, MD;  Location: [facility];  Service: Plastics;  Laterality: Left;   CATARACT EXTRACTION W/ INTRAOCULAR LENS  IMPLANT, BILATERAL  2005; 2017   CHOLECYSTECTOMY  09/17/2011   Procedure: LAPAROSCOPIC CHOLECYSTECTOMY WITH INTRAOPERATIVE CHOLANGIOGRAM;  Surgeon: [name], MD;  Location: [facility];  Service: General;  Laterality: N/A;  carm    DILATATION & CURETTAGE/HYSTEROSCOPY WITH MYOSURE N/A 07/20/2019   Procedure: DILATATION & CURETTAGE/HYSTEROSCOPY WITH MYOSURE;  Surgeon: Jerelyn Charles, MD;  Location: [place];  Service: Gynecology;  Laterality: N/A;  HYSTEROSCOPY WITH D & C N/A 10/28/2015   Procedure: DILATATION AND CURETTAGE /HYSTEROSCOPY, Myosure;  Surgeon: Alden Hipp, MD;  Location: [facility];  Service: Gynecology;  Laterality: N/A;   MASTECTOMY Left 2016   NIPPLE SPARING MASTECTOMY/SENTINAL LYMPH NODE BIOPSY/RECONSTRUCTION/PLACEMENT OF TISSUE EXPANDER Left 04/07/2015   Procedure: LEFT NIPPLE SPARING MASTECTOMY WITH LEFT SENTINAL LYMPH NODE BIOPSY AND  RECONSTRUCTION WITH PLACEMENT OF TISSUE EXPANDER;  Surgeon: Fanny Skates, MD;  Location: [place];  Service: General;  Laterality: Left;   RE-EXCISION OF BREAST LUMPECTOMY Left 01/12/2015   Procedure: LEFT  BREAST LUMPECTOMY RE-EXCISION OF MARGINS;  Surgeon: Fanny Skates, MD;  Location: [place];  Service: General;  Laterality: Left;    reports that she quit smoking about 6 years ago. Her smoking use included cigarettes. She has a 23.00 pack-year smoking history. She has never used smokeless tobacco. She reports current alcohol use. She reports that she does not use drugs. family history includes Breast cancer in her maternal aunt; Cancer in her brother; Colon cancer in an other family member; Heart disease in her father and mother; Hypertension in her mother; Leukemia in her father. Allergies  Allergen Reactions   Latex Itching and Rash   Other Itching and Rash    Patient states that she was unable to use supportive wrap they provided at [facility] following a breast removal and reconstruction.      Outpatient Encounter Medications as of 03/15/2021  Medication Sig   amitriptyline (ELAVIL) 10 MG tablet Take 10 mg by mouth at bedtime as needed.    Ascorbic Acid (VITAMIN C) 1000 MG tablet Take 1,000 mg by mouth daily.   aspirin 81 MG chewable tablet Chew 81 mg by mouth daily.   atenolol (TENORMIN) 25 MG tablet Take 25 mg by mouth daily.   atorvastatin (LIPITOR) 40 MG tablet Take 40 mg by mouth daily.   Calcium Carb-Cholecalciferol (CALCIUM + VITAMIN D3 PO) Take 1,200 mg by mouth daily.   Cholecalciferol (VITAMIN D3) 50 MCG (2000 UT) TABS Take 1 tablet by mouth daily.   glucosamine-chondroitin 500-400 MG tablet Take 2 tablets by mouth daily.   levothyroxine (SYNTHROID, LEVOTHROID) 50 MCG tablet Take 50 mcg by mouth daily.    losartan (COZAAR) 100 MG tablet Take 100 mg by mouth daily.    Multiple Vitamins-Minerals (CENTRUM SILVER 50+WOMEN PO) Take 1 tablet by mouth daily.   Omega-3 Fatty Acids (OMEGA-3 FISH OIL) 300 MG CAPS Take 1 capsule by  mouth daily.   omeprazole (PRILOSEC) 40 MG capsule Take 40 mg by mouth daily.   Probiotic CAPS Take 1 capsule by mouth every morning.   traZODone (DESYREL) 50 MG tablet Take 50 mg by mouth at bedtime as needed for sleep.   VITAMIN E PO Take by mouth daily.   zinc gluconate 50 MG tablet Take 50 mg by mouth daily.   No facility-administered encounter medications on file as of 03/15/2021.    REVIEW OF SYSTEMS  : All other systems reviewed and negative except where noted in the History of Present Illness.   PHYSICAL EXAM: BP (!) 148/84   Pulse (!) 59   Ht 5\' 3"  (1.6 m)   Wt 189 lb 12.8 oz (86.1 kg)   BMI 33.62 kg/m  General: Well developed white female in no acute distress Head: Normocephalic and atraumatic Eyes:  Sclerae anicteric, conjunctiva pink. Ears: Normal auditory acuity Lungs: Clear throughout to auscultation; no W/R/R. Heart: Regular rate and rhythm; no M/R/G. Abdomen: Soft, non-distended.  BS present.  Mid and epigastric TTP. Rectal:  Will be done at the time of colonoscopy. Musculoskeletal: Symmetrical with no gross deformities  Skin: No lesions on visible extremities Extremities: No edema  Neurological: Alert oriented x 4, grossly non-focal Psychological:  Alert and cooperative. Normal mood and affect  ASSESSMENT AND PLAN: *Mild epigastric abdominal pain that is described as burning, tightness, bloating.  She does have a history of pancreatitis in 2017 that was thought to be due to her hydrochlorothiazide, no other issues since then.  Recent extensive labs by her PCP back in May were unremarkable.  We will plan for CT scan of the abdomen and pelvis with contrast.  We will try Bentyl 10 mg twice daily.  Prescription sent to pharmacy. *Diarrhea:  Has been present for the past few months in conjunction with her other GI complaints.  Last colonoscopy 2012.  Will plan for colonoscopy with [name].  ? Random biopsies to rule out microscopic colitis.   **If all above work-up  is unremarkable, question testing or treating for SIBO.  **The risks, benefits, and alternatives to colonoscopy were discussed with the patient and she consents to proceed.  CC:  Michael Boston, MD

## 2021-03-15 NOTE — Patient Instructions (Signed)
If you are age 71 or older, your body mass index should be between 23-30. Your Body mass index is 33.62 kg/m. If this is out of the aforementioned range listed, please consider follow up with your Primary Care Provider.  If you are age 35 or younger, your body mass index should be between 19-25. Your Body mass index is 33.62 kg/m. If this is out of the aformentioned range listed, please consider follow up with your Primary Care Provider.   __________________________________________________________  The [facility] providers would like to encourage you to use [facility] to communicate with providers for non-urgent requests or questions.  Due to long hold times on the telephone, sending your provider a message by [facility] may be a faster and more efficient way to get a response.  Please allow 48 business hours for a response.  Please remember that this is for non-urgent requests.   You have been scheduled for a colonoscopy. Please follow written instructions given to you at your visit today.  Please pick up your prep supplies at the pharmacy within the next 1-3 days. If you use inhalers (even only as needed), please bring them with you on the day of your procedure.  You have been scheduled for a CT scan of the abdomen and pelvis at [facility] ([address]---this is in the same building as [employer]).   You are scheduled on ________ at __________. You should arrive 15 minutes prior to your appointment time for registration. Please follow the written instructions below on the day of your exam:  WARNING: IF YOU ARE ALLERGIC TO IODINE/X-RAY DYE, PLEASE NOTIFY RADIOLOGY IMMEDIATELY AT [phone]! YOU WILL BE GIVEN A 13 HOUR PREMEDICATION PREP.  1) Do not eat or drink anything after _________ (4 hours prior to your test) 2) You have been given 2 bottles of oral contrast to drink. The solution may taste better if refrigerated, but do NOT add ice or any other liquid to this  solution. Shake well before drinking.    Drink 1 bottle of contrast @ _________ (2 hours prior to your exam)  Drink 1 bottle of contrast @ ___________ (1 hour prior to your exam)  You may take any medications as prescribed with a small amount of water, if necessary. If you take any of the following medications: METFORMIN, GLUCOPHAGE, GLUCOVANCE, AVANDAMET, RIOMET, FORTAMET, [place] MET, JANUMET, GLUMETZA or METAGLIP, you MAY be asked to HOLD this medication 48 hours AFTER the exam.  The purpose of you drinking the oral contrast is to aid in the visualization of your intestinal tract. The contrast solution may cause some diarrhea. Depending on your individual set of symptoms, you may also receive an intravenous injection of x-ray contrast/dye. Plan on being at [facility] for 30 minutes or longer, depending on the type of exam you are having performed.  This test typically takes 30-45 minutes to complete.  If you have any questions regarding your exam or if you need to reschedule, you may call the CT department at [phone] between the hours of 8:00 am and 5:00 pm, Monday-Friday.  ________________________________________________________________________  Start Dicyclomine 10 mg twice daily  Follow up pending at this time.  Thank you for entrusting me with your care and choosing [facility].  Alonza Bogus, PA-C

## 2021-03-16 ENCOUNTER — Other Ambulatory Visit: Payer: Self-pay

## 2021-03-16 ENCOUNTER — Encounter: Payer: Self-pay | Admitting: Gastroenterology

## 2021-03-16 DIAGNOSIS — K219 Gastro-esophageal reflux disease without esophagitis: Secondary | ICD-10-CM

## 2021-03-16 DIAGNOSIS — R197 Diarrhea, unspecified: Secondary | ICD-10-CM | POA: Insufficient documentation

## 2021-03-16 DIAGNOSIS — R140 Abdominal distension (gaseous): Secondary | ICD-10-CM | POA: Insufficient documentation

## 2021-03-16 DIAGNOSIS — K859 Acute pancreatitis without necrosis or infection, unspecified: Secondary | ICD-10-CM

## 2021-03-16 DIAGNOSIS — R1033 Periumbilical pain: Secondary | ICD-10-CM | POA: Insufficient documentation

## 2021-03-17 NOTE — Progress Notes (Signed)
Noted  

## 2021-03-20 ENCOUNTER — Other Ambulatory Visit (INDEPENDENT_AMBULATORY_CARE_PROVIDER_SITE_OTHER): Payer: Medicare HMO

## 2021-03-20 DIAGNOSIS — K219 Gastro-esophageal reflux disease without esophagitis: Secondary | ICD-10-CM

## 2021-03-20 DIAGNOSIS — K859 Acute pancreatitis without necrosis or infection, unspecified: Secondary | ICD-10-CM | POA: Diagnosis not present

## 2021-03-20 LAB — BASIC METABOLIC PANEL
BUN: 10 mg/dL (ref 6–23)
CO2: 27 mEq/L (ref 19–32)
Calcium: 9.1 mg/dL (ref 8.4–10.5)
Chloride: 107 mEq/L (ref 96–112)
Creatinine, Ser: 0.8 mg/dL (ref 0.40–1.20)
GFR: 74.26 mL/min (ref >60.00)
Glucose, Bld: 83 mg/dL (ref 70–99)
Potassium: 3.7 mEq/L (ref 3.5–5.1)
Sodium: 143 mEq/L (ref 135–145)

## 2021-03-24 ENCOUNTER — Other Ambulatory Visit: Payer: Self-pay

## 2021-03-24 ENCOUNTER — Ambulatory Visit (INDEPENDENT_AMBULATORY_CARE_PROVIDER_SITE_OTHER)
Admission: RE | Admit: 2021-03-24 | Discharge: 2021-03-24 | Disposition: A | Discharge status: Home / Self Care | Payer: Medicare HMO | Source: Ambulatory Visit | Attending: Gastroenterology | Admitting: Gastroenterology

## 2021-03-24 DIAGNOSIS — R1033 Periumbilical pain: Secondary | ICD-10-CM

## 2021-03-24 DIAGNOSIS — R140 Abdominal distension (gaseous): Secondary | ICD-10-CM

## 2021-03-24 DIAGNOSIS — R197 Diarrhea, unspecified: Secondary | ICD-10-CM

## 2021-03-24 DIAGNOSIS — K859 Acute pancreatitis without necrosis or infection, unspecified: Secondary | ICD-10-CM

## 2021-03-24 DIAGNOSIS — R102 Pelvic and perineal pain: Secondary | ICD-10-CM | POA: Diagnosis not present

## 2021-03-24 DIAGNOSIS — K429 Umbilical hernia without obstruction or gangrene: Secondary | ICD-10-CM | POA: Diagnosis not present

## 2021-03-24 DIAGNOSIS — K6389 Other specified diseases of intestine: Secondary | ICD-10-CM | POA: Diagnosis not present

## 2021-03-24 MED ORDER — IOHEXOL 300 MG/ML  SOLN
100.0000 mL | Freq: Once | INTRAMUSCULAR | Status: AC | PRN
  Administered 2021-03-24: 100 mL via INTRAVENOUS

## 2021-03-28 ENCOUNTER — Other Ambulatory Visit: Payer: Self-pay

## 2021-03-28 ENCOUNTER — Ambulatory Visit (AMBULATORY_SURGERY_CENTER): Payer: Medicare HMO | Admitting: Internal Medicine

## 2021-03-28 ENCOUNTER — Encounter: Payer: Self-pay | Admitting: Internal Medicine

## 2021-03-28 VITALS — BP 175/75 | HR 60 | Temp 97.3°F | Resp 17 | Ht 63.0 in | Wt 189.0 lb

## 2021-03-28 DIAGNOSIS — R140 Abdominal distension (gaseous): Secondary | ICD-10-CM | POA: Diagnosis not present

## 2021-03-28 DIAGNOSIS — R197 Diarrhea, unspecified: Secondary | ICD-10-CM

## 2021-03-28 DIAGNOSIS — R109 Unspecified abdominal pain: Secondary | ICD-10-CM | POA: Diagnosis not present

## 2021-03-28 DIAGNOSIS — Z1211 Encounter for screening for malignant neoplasm of colon: Secondary | ICD-10-CM

## 2021-03-28 MED ORDER — SODIUM CHLORIDE 0.9 % IV SOLN: 500.0000 mL | Freq: Once | INTRAVENOUS | Status: DC

## 2021-03-28 NOTE — Progress Notes (Signed)
Called to procedure room to assist MD. Name and DOB verified.

## 2021-03-28 NOTE — Patient Instructions (Signed)
Please read handouts provided. Continue present medications. Await pathology results. Recommend Citrucel 1-2 tablespoons daily to help bulk stool. Okay to use Imodium for diarrhea. Office follow-up with [name] in 4 to 6 weeks.   YOU HAD AN ENDOSCOPIC PROCEDURE TODAY AT [facility]:   Refer to the procedure report that was given to you for any specific questions about what was found during the examination.  If the procedure report does not answer your questions, please call your gastroenterologist to clarify.  If you requested that your care partner not be given the details of your procedure findings, then the procedure report has been included in a sealed envelope for you to review at your convenience later.  YOU SHOULD EXPECT: Some feelings of bloating in the abdomen. Passage of more gas than usual.  Walking can help get rid of the air that was put into your GI tract during the procedure and reduce the bloating. If you had a lower endoscopy (such as a colonoscopy or flexible sigmoidoscopy) you may notice spotting of blood in your stool or on the toilet paper. If you underwent a bowel prep for your procedure, you may not have a normal bowel movement for a few days.  Please Note:  You might notice some irritation and congestion in your nose or some drainage.  This is from the oxygen used during your procedure.  There is no need for concern and it should clear up in a day or so.  SYMPTOMS TO REPORT IMMEDIATELY:  Following lower endoscopy (colonoscopy or flexible sigmoidoscopy):  Excessive amounts of blood in the stool  Significant tenderness or worsening of abdominal pains  Swelling of the abdomen that is new, acute  Fever of 100F or higher   For urgent or emergent issues, a gastroenterologist can be reached at any hour by calling [phone]. Do not use MyChart messaging for urgent concerns.    DIET:  We do recommend a small meal at first, but then you may proceed to  your regular diet.  Drink plenty of fluids but you should avoid alcoholic beverages for 24 hours.  ACTIVITY:  You should plan to take it easy for the rest of today and you should NOT DRIVE or use heavy machinery until tomorrow (because of the sedation medicines used during the test).    FOLLOW UP: Our staff will call the number listed on your records 48-72 hours following your procedure to check on you and address any questions or concerns that you may have regarding the information given to you following your procedure. If we do not reach you, we will leave a message.  We will attempt to reach you two times.  During this call, we will ask if you have developed any symptoms of COVID 19. If you develop any symptoms (ie: fever, flu-like symptoms, shortness of breath, cough etc.) before then, please call [phone].  If you test positive for Covid 19 in the 2 weeks post procedure, please call and report this information to [place].    If any biopsies were taken you will be contacted by phone or by letter within the next 1-3 weeks.  Please call us at [phone] if you have not heard about the biopsies in 3 weeks.    SIGNATURES/CONFIDENTIALITY: You and/or your care partner have signed paperwork which will be entered into your electronic medical record.  These signatures attest to the fact that that the information above on your After Visit Summary has been reviewed and  is understood.  Full responsibility of the confidentiality of this discharge information lies with you and/or your care-partner.

## 2021-03-28 NOTE — Op Note (Signed)
[facility] Patient Name: [name] Procedure Date: 03/28/2021 3:23 PM MRN: [id number] Endoscopist: Docia Chuck. Henrene Pastor , MD Age: 70 Referring MD:  Date of Birth: Nov 13, 1950 Gender: Female Account #: [id number] Procedure:                Colonoscopy with biopsies Indications:              Colon cancer screening. Average risk. Negative                            index exam 2012. Also with clinically significant                            diarrhea of unexplained origin for which she was                            seen in the office recently Medicines:                Monitored Anesthesia Care Procedure:                Pre-Anesthesia Assessment:                           - Prior to the procedure, a History and Physical                            was performed, and patient medications and                            allergies were reviewed. The patient's tolerance of                            previous anesthesia was also reviewed. The risks                            and benefits of the procedure and the sedation                            options and risks were discussed with the patient.                            All questions were answered, and informed consent                            was obtained. Prior Anticoagulants: The patient has                            taken no previous anticoagulant or antiplatelet                            agents. ASA Grade Assessment: II - A patient with                            mild systemic disease. After reviewing the risks  and benefits, the patient was deemed in                            satisfactory condition to undergo the procedure.                           After obtaining informed consent, the colonoscope                            was passed under direct vision. Throughout the                            procedure, the patient's blood pressure, pulse, and                            oxygen saturations were  monitored continuously. The                            Olympus CF-HQ190L ([id number]) Colonoscope was                            introduced through the anus and advanced to the the                            cecum, identified by appendiceal orifice and                            ileocecal valve. The terminal ileum, ileocecal                            valve, appendiceal orifice, and rectum were                            photographed. The quality of the bowel preparation                            was excellent. The colonoscopy was performed                            without difficulty. The patient tolerated the                            procedure well. The bowel preparation used was                            SUPREP via split dose instruction. Scope In: 3:50:01 PM Scope Out: 4:03:25 PM Scope Withdrawal Time: 0 hours 10 minutes 58 seconds  Total Procedure Duration: 0 hours 13 minutes 24 seconds  Findings:                 The terminal ileum appeared normal.                           The entire examined colon appeared normal on direct  and retroflexion views. Biopsies for histology were                            taken with a cold forceps from the entire colon for                            evaluation of microscopic colitis. Complications:            No immediate complications. Estimated blood loss:                            None. Estimated Blood Loss:     Estimated blood loss: none. Impression:               - The examined portion of the ileum was normal.                           - The entire examined colon is normal on direct and                            retroflexion views. Recommendation:           - Repeat colonoscopy is not recommended for                            surveillance.                           - Patient has a contact number available for                            emergencies. The signs and symptoms of potential                             delayed complications were discussed with the                            patient. Return to normal activities tomorrow.                            Written discharge instructions were provided to the                            patient.                           - Resume previous diet.                           - Continue present medications.                           - Await pathology results.                           - Recommend Citrucel 1 or 2 tablespoons daily. This  may give you stool better bulk                           - Okay to use Imodium for diarrhea                           - Office follow-up with [name] in 4 to 6 weeks Docia Chuck. Henrene Pastor, MD 03/28/2021 4:11:32 PM This report has been signed electronically.

## 2021-03-28 NOTE — Progress Notes (Signed)
[place]  History reviewed.

## 2021-03-28 NOTE — Progress Notes (Signed)
pt tolerated well. VSS. awake and to recovery. Report given to RN.  

## 2021-03-30 ENCOUNTER — Telehealth: Payer: Self-pay | Admitting: *Deleted

## 2021-03-30 NOTE — Telephone Encounter (Signed)
  Follow up Call-  Call back number 03/28/2021  Post procedure Call Back phone  # [phone]  Permission to leave phone message Yes  Some recent data might be hidden     Patient questions:  Do you have a fever, pain , or abdominal swelling? No. Pain Score  0 *  Have you tolerated food without any problems? Yes.    Have you been able to return to your normal activities? Yes.    Do you have any questions about your discharge instructions: Diet   No. Medications  No. Follow up visit  No.  Do you have questions or concerns about your Care? no  Actions: * If pain score is 4 or above: No action needed, pain <4.  Have you developed a fever since your procedure? no  2.   Have you had an respiratory symptoms (SOB or cough) since your procedure? no  3.   Have you tested positive for COVID 19 since your procedure no  4.   Have you had any family members/close contacts diagnosed with the COVID 19 since your procedure?  no   If yes to any of these questions please route to Joylene John, RN and Joella Prince, RN

## 2021-04-05 ENCOUNTER — Encounter: Payer: Self-pay | Admitting: Internal Medicine

## 2021-04-25 NOTE — Addendum Note (Signed)
Encounter addended by: Annie Paras on: 04/25/2021 9:19 AM  Actions taken: Letter saved

## 2021-05-01 ENCOUNTER — Encounter: Payer: Medicare HMO | Admitting: Internal Medicine

## 2021-05-10 ENCOUNTER — Ambulatory Visit: Payer: Medicare HMO | Admitting: Internal Medicine

## 2021-05-22 DIAGNOSIS — R9389 Abnormal findings on diagnostic imaging of other specified body structures: Secondary | ICD-10-CM | POA: Diagnosis not present

## 2021-05-22 DIAGNOSIS — N858 Other specified noninflammatory disorders of uterus: Secondary | ICD-10-CM | POA: Diagnosis not present

## 2021-05-22 DIAGNOSIS — N840 Polyp of corpus uteri: Secondary | ICD-10-CM | POA: Diagnosis not present

## 2021-07-31 DIAGNOSIS — E669 Obesity, unspecified: Secondary | ICD-10-CM | POA: Diagnosis not present

## 2021-07-31 DIAGNOSIS — J432 Centrilobular emphysema: Secondary | ICD-10-CM | POA: Diagnosis not present

## 2021-07-31 DIAGNOSIS — Z87891 Personal history of nicotine dependence: Secondary | ICD-10-CM | POA: Diagnosis not present

## 2021-07-31 DIAGNOSIS — I10 Essential (primary) hypertension: Secondary | ICD-10-CM | POA: Diagnosis not present

## 2021-07-31 DIAGNOSIS — Z6833 Body mass index (BMI) 33.0-33.9, adult: Secondary | ICD-10-CM | POA: Diagnosis not present

## 2021-07-31 DIAGNOSIS — E785 Hyperlipidemia, unspecified: Secondary | ICD-10-CM | POA: Diagnosis not present

## 2021-07-31 DIAGNOSIS — I700 Atherosclerosis of aorta: Secondary | ICD-10-CM | POA: Diagnosis not present

## 2021-07-31 DIAGNOSIS — F340 Cyclothymic disorder: Secondary | ICD-10-CM | POA: Diagnosis not present

## 2021-08-03 DIAGNOSIS — R9389 Abnormal findings on diagnostic imaging of other specified body structures: Secondary | ICD-10-CM | POA: Diagnosis not present

## 2021-08-03 DIAGNOSIS — Z853 Personal history of malignant neoplasm of breast: Secondary | ICD-10-CM | POA: Diagnosis not present

## 2021-08-03 DIAGNOSIS — N3946 Mixed incontinence: Secondary | ICD-10-CM | POA: Diagnosis not present

## 2021-08-03 DIAGNOSIS — R3129 Other microscopic hematuria: Secondary | ICD-10-CM | POA: Diagnosis not present

## 2021-08-03 DIAGNOSIS — N952 Postmenopausal atrophic vaginitis: Secondary | ICD-10-CM | POA: Diagnosis not present

## 2021-09-11 DIAGNOSIS — R310 Gross hematuria: Secondary | ICD-10-CM | POA: Diagnosis not present

## 2021-09-11 DIAGNOSIS — N3946 Mixed incontinence: Secondary | ICD-10-CM | POA: Diagnosis not present

## 2021-09-28 DIAGNOSIS — R9389 Abnormal findings on diagnostic imaging of other specified body structures: Secondary | ICD-10-CM | POA: Diagnosis not present

## 2021-09-28 DIAGNOSIS — Z87448 Personal history of other diseases of urinary system: Secondary | ICD-10-CM | POA: Diagnosis not present

## 2021-09-28 DIAGNOSIS — N3281 Overactive bladder: Secondary | ICD-10-CM | POA: Diagnosis not present

## 2021-10-10 DIAGNOSIS — Z6834 Body mass index (BMI) 34.0-34.9, adult: Secondary | ICD-10-CM | POA: Diagnosis not present

## 2021-10-10 DIAGNOSIS — R635 Abnormal weight gain: Secondary | ICD-10-CM | POA: Diagnosis not present

## 2021-11-06 DIAGNOSIS — Z01419 Encounter for gynecological examination (general) (routine) without abnormal findings: Secondary | ICD-10-CM | POA: Diagnosis not present

## 2021-11-06 DIAGNOSIS — R350 Frequency of micturition: Secondary | ICD-10-CM | POA: Diagnosis not present

## 2021-11-06 DIAGNOSIS — Z6833 Body mass index (BMI) 33.0-33.9, adult: Secondary | ICD-10-CM | POA: Diagnosis not present

## 2021-11-06 DIAGNOSIS — Z124 Encounter for screening for malignant neoplasm of cervix: Secondary | ICD-10-CM | POA: Diagnosis not present

## 2021-11-16 DIAGNOSIS — F32A Depression, unspecified: Secondary | ICD-10-CM | POA: Diagnosis not present

## 2021-11-16 DIAGNOSIS — E782 Mixed hyperlipidemia: Secondary | ICD-10-CM | POA: Diagnosis not present

## 2021-11-16 DIAGNOSIS — E039 Hypothyroidism, unspecified: Secondary | ICD-10-CM | POA: Diagnosis not present

## 2021-11-16 DIAGNOSIS — Z6833 Body mass index (BMI) 33.0-33.9, adult: Secondary | ICD-10-CM | POA: Diagnosis not present

## 2021-11-16 DIAGNOSIS — R635 Abnormal weight gain: Secondary | ICD-10-CM | POA: Diagnosis not present

## 2021-11-16 DIAGNOSIS — E669 Obesity, unspecified: Secondary | ICD-10-CM | POA: Diagnosis not present

## 2021-11-16 DIAGNOSIS — I10 Essential (primary) hypertension: Secondary | ICD-10-CM | POA: Diagnosis not present

## 2021-11-16 DIAGNOSIS — F5101 Primary insomnia: Secondary | ICD-10-CM | POA: Diagnosis not present

## 2021-11-16 DIAGNOSIS — F419 Anxiety disorder, unspecified: Secondary | ICD-10-CM | POA: Diagnosis not present

## 2021-12-06 DIAGNOSIS — Z6831 Body mass index (BMI) 31.0-31.9, adult: Secondary | ICD-10-CM | POA: Diagnosis not present

## 2021-12-06 DIAGNOSIS — D72829 Elevated white blood cell count, unspecified: Secondary | ICD-10-CM | POA: Diagnosis not present

## 2021-12-06 DIAGNOSIS — E669 Obesity, unspecified: Secondary | ICD-10-CM | POA: Diagnosis not present

## 2021-12-06 DIAGNOSIS — Z713 Dietary counseling and surveillance: Secondary | ICD-10-CM | POA: Diagnosis not present

## 2021-12-14 DIAGNOSIS — Z6830 Body mass index (BMI) 30.0-30.9, adult: Secondary | ICD-10-CM | POA: Diagnosis not present

## 2021-12-14 DIAGNOSIS — E669 Obesity, unspecified: Secondary | ICD-10-CM | POA: Diagnosis not present

## 2021-12-20 DIAGNOSIS — Z6831 Body mass index (BMI) 31.0-31.9, adult: Secondary | ICD-10-CM | POA: Diagnosis not present

## 2021-12-20 DIAGNOSIS — E669 Obesity, unspecified: Secondary | ICD-10-CM | POA: Diagnosis not present

## 2022-01-23 ENCOUNTER — Other Ambulatory Visit: Payer: Self-pay | Admitting: Internal Medicine

## 2022-01-23 DIAGNOSIS — E669 Obesity, unspecified: Secondary | ICD-10-CM | POA: Diagnosis not present

## 2022-01-23 DIAGNOSIS — Z6831 Body mass index (BMI) 31.0-31.9, adult: Secondary | ICD-10-CM | POA: Diagnosis not present

## 2022-01-23 DIAGNOSIS — Z1231 Encounter for screening mammogram for malignant neoplasm of breast: Secondary | ICD-10-CM

## 2022-01-24 DIAGNOSIS — R7301 Impaired fasting glucose: Secondary | ICD-10-CM | POA: Diagnosis not present

## 2022-01-24 DIAGNOSIS — Z79899 Other long term (current) drug therapy: Secondary | ICD-10-CM | POA: Diagnosis not present

## 2022-01-24 DIAGNOSIS — E785 Hyperlipidemia, unspecified: Secondary | ICD-10-CM | POA: Diagnosis not present

## 2022-01-24 DIAGNOSIS — M8580 Other specified disorders of bone density and structure, unspecified site: Secondary | ICD-10-CM | POA: Diagnosis not present

## 2022-01-24 DIAGNOSIS — I10 Essential (primary) hypertension: Secondary | ICD-10-CM | POA: Diagnosis not present

## 2022-01-24 DIAGNOSIS — E039 Hypothyroidism, unspecified: Secondary | ICD-10-CM | POA: Diagnosis not present

## 2022-02-01 DIAGNOSIS — I10 Essential (primary) hypertension: Secondary | ICD-10-CM | POA: Diagnosis not present

## 2022-02-01 DIAGNOSIS — E785 Hyperlipidemia, unspecified: Secondary | ICD-10-CM | POA: Diagnosis not present

## 2022-02-01 DIAGNOSIS — M8580 Other specified disorders of bone density and structure, unspecified site: Secondary | ICD-10-CM | POA: Diagnosis not present

## 2022-02-01 DIAGNOSIS — J432 Centrilobular emphysema: Secondary | ICD-10-CM | POA: Diagnosis not present

## 2022-02-01 DIAGNOSIS — Z87891 Personal history of nicotine dependence: Secondary | ICD-10-CM | POA: Diagnosis not present

## 2022-02-01 DIAGNOSIS — Z1331 Encounter for screening for depression: Secondary | ICD-10-CM | POA: Diagnosis not present

## 2022-02-01 DIAGNOSIS — E039 Hypothyroidism, unspecified: Secondary | ICD-10-CM | POA: Diagnosis not present

## 2022-02-01 DIAGNOSIS — Z1339 Encounter for screening examination for other mental health and behavioral disorders: Secondary | ICD-10-CM | POA: Diagnosis not present

## 2022-02-01 DIAGNOSIS — R7303 Prediabetes: Secondary | ICD-10-CM | POA: Diagnosis not present

## 2022-02-01 DIAGNOSIS — I700 Atherosclerosis of aorta: Secondary | ICD-10-CM | POA: Diagnosis not present

## 2022-02-01 DIAGNOSIS — R109 Unspecified abdominal pain: Secondary | ICD-10-CM | POA: Diagnosis not present

## 2022-02-01 DIAGNOSIS — Z0000 Encounter for general adult medical examination without abnormal findings: Secondary | ICD-10-CM | POA: Diagnosis not present

## 2022-02-02 ENCOUNTER — Other Ambulatory Visit: Payer: Self-pay | Admitting: Internal Medicine

## 2022-02-02 ENCOUNTER — Ambulatory Visit
Admission: RE | Admit: 2022-02-02 | Discharge: 2022-02-02 | Disposition: A | Discharge status: Home / Self Care | Payer: Medicare HMO | Source: Ambulatory Visit | Attending: Internal Medicine | Admitting: Internal Medicine

## 2022-02-02 DIAGNOSIS — R109 Unspecified abdominal pain: Secondary | ICD-10-CM

## 2022-02-02 DIAGNOSIS — R319 Hematuria, unspecified: Secondary | ICD-10-CM | POA: Diagnosis not present

## 2022-02-02 DIAGNOSIS — Z87891 Personal history of nicotine dependence: Secondary | ICD-10-CM

## 2022-02-06 DIAGNOSIS — R350 Frequency of micturition: Secondary | ICD-10-CM | POA: Diagnosis not present

## 2022-02-06 DIAGNOSIS — R3129 Other microscopic hematuria: Secondary | ICD-10-CM | POA: Diagnosis not present

## 2022-02-28 ENCOUNTER — Ambulatory Visit
Admission: RE | Admit: 2022-02-28 | Discharge: 2022-02-28 | Disposition: A | Discharge status: Home / Self Care | Payer: Medicare HMO | Source: Ambulatory Visit | Attending: Internal Medicine | Admitting: Internal Medicine

## 2022-02-28 DIAGNOSIS — Z87891 Personal history of nicotine dependence: Secondary | ICD-10-CM

## 2022-02-28 DIAGNOSIS — I2510 Atherosclerotic heart disease of native coronary artery without angina pectoris: Secondary | ICD-10-CM | POA: Diagnosis not present

## 2022-02-28 DIAGNOSIS — J432 Centrilobular emphysema: Secondary | ICD-10-CM | POA: Diagnosis not present

## 2022-02-28 DIAGNOSIS — I700 Atherosclerosis of aorta: Secondary | ICD-10-CM | POA: Diagnosis not present

## 2022-03-01 ENCOUNTER — Ambulatory Visit (HOSPITAL_COMMUNITY): Payer: Medicare HMO | Attending: Cardiovascular Disease

## 2022-03-01 DIAGNOSIS — I340 Nonrheumatic mitral (valve) insufficiency: Secondary | ICD-10-CM | POA: Diagnosis not present

## 2022-03-01 LAB — ECHOCARDIOGRAM COMPLETE
Area-P 1/2: 3.97 cm2
MV M vel: 5.7 m/s
MV Peak grad: 130 mmHg
Radius: 0.53 cm
S' Lateral: 3.1 cm

## 2022-03-05 ENCOUNTER — Ambulatory Visit: Payer: Medicare HMO | Admitting: Physician Assistant

## 2022-03-05 ENCOUNTER — Encounter: Payer: Self-pay | Admitting: Physician Assistant

## 2022-03-05 VITALS — BP 154/72 | HR 51 | Ht 63.0 in | Wt 181.2 lb

## 2022-03-05 DIAGNOSIS — R0789 Other chest pain: Secondary | ICD-10-CM

## 2022-03-05 DIAGNOSIS — Z01818 Encounter for other preprocedural examination: Secondary | ICD-10-CM

## 2022-03-05 DIAGNOSIS — I340 Nonrheumatic mitral (valve) insufficiency: Secondary | ICD-10-CM | POA: Diagnosis not present

## 2022-03-05 DIAGNOSIS — I10 Essential (primary) hypertension: Secondary | ICD-10-CM | POA: Diagnosis not present

## 2022-03-05 MED ORDER — VALSARTAN 320 MG PO TABS: 320.0000 mg | ORAL_TABLET | Freq: Every day | ORAL | 3 refills | Status: DC

## 2022-03-05 NOTE — Patient Instructions (Addendum)
Medication Instructions:  STOP taking Losartan on the day Valsartan is delivered to your home from mail order pharmacy START Valsartan 320 mg daily when it arrives to your home from mail order pharmacy   *If you need a refill on your cardiac medications before your next appointment, please call your pharmacy*  Lab Work: Your physician recommends that you return for lab work TODAY:  BMET  If you have labs (blood work) drawn today and your tests are completely normal, you will receive your results only by: [employer] (if you have MyChart) OR A paper copy in the mail If you have any lab test that is abnormal or we need to change your treatment, we will call you to review the results.  Testing/Procedures: Your physician has requested that you have a cardiac MRI. Cardiac MRI uses a computer to create images of your heart as its beating, producing both still and moving pictures of your heart and major blood vessels. For further information please visit [email or web address] Please follow the instruction sheet given to you today for more information.    Follow-Up: At [facility], you and your health needs are our priority.  As part of our continuing mission to provide you with exceptional heart care, we have created designated Provider Care Teams.  These Care Teams include your primary Cardiologist (physician) and Advanced Practice Providers (APPs -  Physician Assistants and Nurse Practitioners) who all work together to provide you with the care you need, when you need it.   Your next appointment:   2 month(s)  The format for your next appointment:   In Person  Provider:   Elouise Munroe, MD  or  APP  on a day Dr. Margaretann Loveless is in the office          Other Instructions   Important Information About Sugar

## 2022-03-05 NOTE — Progress Notes (Signed)
Cardiology Office Note:    Date:  03/07/2022   ID:  [name], DOB November 16, 1950, MRN [id number]  PCP:  Melida Quitter, MD   [facility] Providers Cardiologist:  Parke Poisson, MD     Referring MD: Melida Quitter, MD   Chief Complaint  Patient presents with   Follow-up    Seen for Dr. Jacques Navy    History of Present Illness:    [name] is a 71 y.o. female with a hx of hypertension, hypothyroidism, breast cancer in 2016 with mastectomy and tamoxifen and pancreatitis secondary to HCTZ.  Previous nuclear stress test obtained on 01/27/2020 showed EF 66%, normal perfusion.  Low risk study.  She has a history of mild MR seen on echocardiogram in June 2021.  EF was 60 to 65% at the time. Patient was last seen by Dr. Jacques Navy on 03/01/2021 at which time she was having intermittent chest pain, Dr. Jacques Navy recommended as needed dose of nitroglycerin. Recent echocardiogram obtained on 03/01/2022 showed EF 50 to 55%, no regional wall motion abnormality, moderate to severe mitral regurgitation.  Patient presents today for follow-up.  We reviewed her 2021 and recent echocardiogram.  She denies any worsening shortness of breath than her usual.  She does admit she is not very active at home.  She has chronic intermittent chest pain that occurs less then once a week and that typically only last about a minute.  This is the same chest discomfort she had prior to her previous nuclear stress test in 2021.  In fact, the frequency and the duration of the chest discomfort has improved.  She denies any dizziness, lower extremity edema, orthopnea or PND.  She has no heart failure symptoms.  We discussed the need for cardiac MRI.  At this time, given no obvious heart failure symptoms, I will hold off on TEE.  I recommended she follow-up in 2 months.  Past Medical History:  Diagnosis Date   Chronic shoulder bursitis    bilateral--- left > right   GERD (gastroesophageal reflux disease)    History of  acute pancreatitis    03-02-2016  w/ sepsis-- resolved   History of cardiac murmur in childhood    History of cellulitis    03-29-2016  chest wall cellulitis w/ possible shingles, resolved   Hypertension    followed by pcp  (07-15-2019 per pt never had stress test)   Hypothyroidism    followed by pcp   Ileitis    dx ED visit in epic 06-07-2019 (07-15-2019  per pt symptoms have resolved)   Malignant neoplasm of upper-outer quadrant of left breast in female, estrogen receptor positive [facility]) oncologist-- dr Pamelia Hoit   dx 04/ 2016,  DCIS left breast, Stage 0, Grade 2,  ER+/ PR negative-- s/p  left lumpectomy 01-04-2015, re-excision 01-12-2015, left mastectomy w/ node dissection 04-07-2015 with reconstruction;  started tamoxifen 09/ 2016   PMB (postmenopausal bleeding)    PONV (postoperative nausea and vomiting)    Wears glasses     Past Surgical History:  Procedure Laterality Date   BREAST BIOPSY Left 12/08/2014; 01/2015   BREAST LUMPECTOMY WITH RADIOACTIVE SEED LOCALIZATION Left 01/04/2015   Procedure: LEFT PARTIAL MASTECTOMY WITH RADIOACTIVE SEED LOCALIZATION;  Surgeon: Claud Kelp, MD;  Location: [facility] OR;  Service: General;  Laterality: Left;   BREAST RECONSTRUCTION WITH PLACEMENT OF TISSUE EXPANDER AND FLEX HD (ACELLULAR HYDRATED DERMIS) Left 04/07/2015   Procedure: IMMEDIATE LEFT BREAST RECONSTRUCTION WITH PLACEMENT SALINE IMPLANT AND CHEST WALL  RECONSTRUCTION WITH  ACELLULAR DERMAL MATRIX ;  Surgeon: Etter Sjogren, MD;  Location: [facility] OR;  Service: Plastics;  Laterality: Left;   CATARACT EXTRACTION W/ INTRAOCULAR LENS  IMPLANT, BILATERAL  2005; 2017   CHOLECYSTECTOMY  09/17/2011   Procedure: LAPAROSCOPIC CHOLECYSTECTOMY WITH INTRAOPERATIVE CHOLANGIOGRAM;  Surgeon: Ernestene Mention, MD;  Location: [facility] OR;  Service: General;  Laterality: N/A;  carm    DILATATION & CURETTAGE/HYSTEROSCOPY WITH MYOSURE N/A 07/20/2019   Procedure: DILATATION & CURETTAGE/HYSTEROSCOPY WITH MYOSURE;  Surgeon: Marlow Baars, MD;  Location: [facility] [place];  Service: Gynecology;  Laterality: N/A;   HYSTEROSCOPY WITH D & C N/A 10/28/2015   Procedure: DILATATION AND CURETTAGE /HYSTEROSCOPY, Myosure;  Surgeon: Ilda Mori, MD;  Location: [facility] ORS;  Service: Gynecology;  Laterality: N/A;   LIVER SURGERY     MASTECTOMY Left 2016   NIPPLE SPARING MASTECTOMY/SENTINAL LYMPH NODE BIOPSY/RECONSTRUCTION/PLACEMENT OF TISSUE EXPANDER Left 04/07/2015   Procedure: LEFT NIPPLE SPARING MASTECTOMY WITH LEFT SENTINAL LYMPH NODE BIOPSY AND  RECONSTRUCTION WITH PLACEMENT OF TISSUE EXPANDER;  Surgeon: Claud Kelp, MD;  Location: [facility] OR;  Service: General;  Laterality: Left;   RE-EXCISION OF BREAST LUMPECTOMY Left 01/12/2015   Procedure: LEFT  BREAST LUMPECTOMY RE-EXCISION OF MARGINS;  Surgeon: Claud Kelp, MD;  Location: [facility];  Service: General;  Laterality: Left;    Current Medications: Current Meds  Medication Sig   albuterol (VENTOLIN HFA) 108 (90 Base) MCG/ACT inhaler    amitriptyline (ELAVIL) 10 MG tablet Take 10 mg by mouth at bedtime as needed.    Ascorbic Acid (VITAMIN C) 1000 MG tablet Take 1,000 mg by mouth daily.   aspirin 81 MG chewable tablet Chew 81 mg by mouth daily.   atenolol (TENORMIN) 25 MG tablet Take 25 mg by mouth daily.   atorvastatin (LIPITOR) 40 MG tablet Take 40 mg by mouth daily.   busPIRone (BUSPAR) 7.5 MG tablet    Calcium Carb-Cholecalciferol (CALCIUM + VITAMIN D3 PO) Take 1,200 mg by mouth daily.   Cholecalciferol (VITAMIN D3) 50 MCG (2000 UT) TABS Take 1 tablet by mouth daily.   dicyclomine (BENTYL) 10 MG capsule Take 1 capsule (10 mg total) by mouth in the morning and at bedtime.   glucosamine-chondroitin 500-400 MG tablet Take 2 tablets by mouth daily.   levothyroxine (SYNTHROID) 50 MCG tablet Take 1 tablet by mouth daily.   metFORMIN (GLUCOPHAGE-XR) 500 MG 24 hr tablet Take 1 tablet by mouth daily with breakfast.   mirabegron ER (MYRBETRIQ) 50 MG TB24  tablet    Multiple Vitamins-Minerals (CENTRUM SILVER 50+WOMEN PO) Take 1 tablet by mouth daily.   Omega-3 Fatty Acids (OMEGA-3 FISH OIL) 300 MG CAPS Take 1 capsule by mouth daily.   omeprazole (PRILOSEC) 40 MG capsule Take 40 mg by mouth daily.   Potassium Citrate,Elemental K, 99 MG CAPS 1 tablet   Probiotic CAPS Take 1 capsule by mouth every morning.   traZODone (DESYREL) 50 MG tablet Take 50 mg by mouth at bedtime as needed for sleep.   valsartan (DIOVAN) 320 MG tablet Take 1 tablet (320 mg total) by mouth daily.   VITAMIN E PO Take by mouth daily.   zinc gluconate 50 MG tablet Take 50 mg by mouth daily.   [DISCONTINUED] losartan (COZAAR) 100 MG tablet Take 100 mg by mouth daily.      Allergies:   Latex and Other   Social History   Socioeconomic History   Marital status: Divorced    Spouse name: Not on  file   Number of children: 1   Years of education: Not on file   Highest education level: Not on file  Occupational History   Occupation: Retired  Tobacco Use   Smoking status: Former    Packs/day: 0.50    Years: 46.00    Total pack years: 23.00    Types: Cigarettes    Quit date: 11/25/2014    Years since quitting: 7.2   Smokeless tobacco: Never  Vaping Use   Vaping Use: Never used  Substance and Sexual Activity   Alcohol use: Yes    Comment: very rare   Drug use: Never   Sexual activity: Not on file  Other Topics Concern   Not on file  Social History Narrative   Not on file   Social Determinants of Health   Financial Resource Strain: Not on file  Food Insecurity: Not on file  Transportation Needs: Not on file  Physical Activity: Not on file  Stress: Not on file  Social Connections: Not on file     Family History: The patient's family history includes Breast cancer in her maternal aunt; Cancer in her brother and father; Colon cancer in an other family member; Heart disease in her father and mother; Hypertension in her mother; Leukemia in her father. There is no  history of Rectal cancer or Stomach cancer.  ROS:   Please see the history of present illness.     All other systems reviewed and are negative.  EKGs/Labs/Other Studies Reviewed:    The following studies were reviewed today:  Echo 03/01/2022 1. Left ventricular ejection fraction, by estimation, is 50 to 55%. The  left ventricle has low normal function. The left ventricle has no regional  wall motion abnormalities. Left ventricular diastolic parameters were  normal. The average left ventricular   global longitudinal strain is -16.9 %. The global longitudinal strain is  normal.   2. Right ventricular systolic function is normal. The right ventricular  size is normal. There is normal pulmonary artery systolic pressure.   3. The mitral valve is normal in structure. Moderate to severe mitral  valve regurgitation.   4. The aortic valve is tricuspid. Aortic valve regurgitation is not  visualized. No aortic stenosis is present.   EKG:  EKG is ordered today.  The ekg ordered today demonstrates sinus bradycardia, poor R wave progression in the anterior leads.  Recent Labs: 03/20/2021: BUN 10; Creatinine, Ser 0.80; Potassium 3.7; Sodium 143  Recent Lipid Panel    Component Value Date/Time   CHOL 138 03/02/2016 1040   TRIG 94 03/02/2016 1040   HDL 53 03/02/2016 1040   CHOLHDL 2.6 03/02/2016 1040   VLDL 19 03/02/2016 1040   LDLCALC 66 03/02/2016 1040     Risk Assessment/Calculations:           Physical Exam:    VS:  BP (!) 154/72   Pulse (!) 51   Ht 5\' 3"  (1.6 m)   Wt 181 lb 3.2 oz (82.2 kg)   SpO2 97%   BMI 32.10 kg/m     Wt Readings from Last 3 Encounters:  03/05/22 181 lb 3.2 oz (82.2 kg)  03/28/21 189 lb (85.7 kg)  03/15/21 189 lb 12.8 oz (86.1 kg)     GEN:  Well nourished, well developed in no acute distress HEENT: Normal NECK: No JVD; No carotid bruits LYMPHATICS: No lymphadenopathy CARDIAC: RRR, no murmurs, rubs, gallops RESPIRATORY:  Clear to auscultation  without rales, wheezing or rhonchi  ABDOMEN:  Soft, non-tender, non-distended MUSCULOSKELETAL:  No edema; No deformity  SKIN: Warm and dry NEUROLOGIC:  Alert and oriented x 3 PSYCHIATRIC:  Normal affect   ASSESSMENT:    1. Nonrheumatic mitral valve regurgitation   2. Preprocedural examination   3. Essential hypertension   4. Atypical chest pain    PLAN:    In order of problems listed above:  Mitral valve regurgitation: Recent echocardiogram shows moderate to severe MR, this is significantly worsened when compared to the previous echocardiogram in June 2021.  Dr. Jacques Navy recommended a cardiac MRI to quantify the degree of MR.  Talking with the patient, she denies any recent worsening dyspnea, lower extremity edema or any heart failure symptoms.  She is essentially asymptomatic.  We will hold off on TEE at this time until cardiac MRI come back  Hypertension: May have contributed to the progression of valve disease, I recommended stopping losartan and switch to valsartan  Chronic atypical chest pain: She has chronic atypical chest pain, over the several years, her chest pain actually improved.  This is the same chest discomfort she had prior to her normal Myoview in 2021.        Medication Adjustments/Labs and Tests Ordered: Current medicines are reviewed at length with the patient today.  Concerns regarding medicines are outlined above.  Orders Placed This Encounter  Procedures   MR CARDIAC MORPHOLOGY W WO CONTRAST   Basic metabolic panel   EKG 12-Lead   Meds ordered this encounter  Medications   valsartan (DIOVAN) 320 MG tablet    Sig: Take 1 tablet (320 mg total) by mouth daily.    Dispense:  90 tablet    Refill:  3    Patient Instructions  Medication Instructions:  STOP taking Losartan on the day Valsartan is delivered to your home from mail order pharmacy START Valsartan 320 mg daily when it arrives to your home from mail order pharmacy   *If you need a refill on  your cardiac medications before your next appointment, please call your pharmacy*  Lab Work: Your physician recommends that you return for lab work TODAY:  BMET  If you have labs (blood work) drawn today and your tests are completely normal, you will receive your results only by: [employer] (if you have MyChart) OR A paper copy in the mail If you have any lab test that is abnormal or we need to change your treatment, we will call you to review the results.  Testing/Procedures: Your physician has requested that you have a cardiac MRI. Cardiac MRI uses a computer to create images of your heart as its beating, producing both still and moving pictures of your heart and major blood vessels. For further information please visit [email or web address]. Please follow the instruction sheet given to you today for more information.    Follow-Up: At [facility], you and your health needs are our priority.  As part of our continuing mission to provide you with exceptional heart care, we have created designated Provider Care Teams.  These Care Teams include your primary Cardiologist (physician) and Advanced Practice Providers (APPs -  Physician Assistants and Nurse Practitioners) who all work together to provide you with the care you need, when you need it.   Your next appointment:   2 month(s)  The format for your next appointment:   In Person  Provider:   Parke Poisson, MD  or  APP  on a day Dr. Jacques Navy is in the office  Other Instructions   Important Information About Sugar         Ramond Dial, [place]  03/07/2022 11:53 PM    [facility]

## 2022-03-07 ENCOUNTER — Encounter: Payer: Self-pay | Admitting: Physician Assistant

## 2022-03-08 DIAGNOSIS — Z01818 Encounter for other preprocedural examination: Secondary | ICD-10-CM | POA: Diagnosis not present

## 2022-03-13 DIAGNOSIS — H5212 Myopia, left eye: Secondary | ICD-10-CM | POA: Diagnosis not present

## 2022-03-13 DIAGNOSIS — Z0100 Encounter for examination of eyes and vision without abnormal findings: Secondary | ICD-10-CM | POA: Diagnosis not present

## 2022-03-13 LAB — BASIC METABOLIC PANEL
BUN/Creatinine Ratio: 14 (ref 12–28)
BUN: 13 mg/dL (ref 8–27)
CO2: 21 mmol/L (ref 20–29)
Calcium: 9.9 mg/dL (ref 8.7–10.3)
Chloride: 108 mmol/L — ABNORMAL HIGH (ref 96–106)
Creatinine, Ser: 0.9 mg/dL (ref 0.57–1.00)
Glucose: 111 mg/dL — ABNORMAL HIGH (ref 70–99)
Potassium: 4.9 mmol/L (ref 3.5–5.2)
Sodium: 147 mmol/L — ABNORMAL HIGH (ref 134–144)
eGFR: 68 mL/min/{1.73_m2} (ref >59)

## 2022-03-14 ENCOUNTER — Ambulatory Visit
Admission: RE | Admit: 2022-03-14 | Discharge: 2022-03-14 | Disposition: A | Discharge status: Home / Self Care | Payer: Medicare HMO | Source: Ambulatory Visit | Attending: Internal Medicine | Admitting: Internal Medicine

## 2022-03-14 DIAGNOSIS — Z1231 Encounter for screening mammogram for malignant neoplasm of breast: Secondary | ICD-10-CM

## 2022-03-15 ENCOUNTER — Other Ambulatory Visit: Payer: Self-pay

## 2022-03-15 DIAGNOSIS — E870 Hyperosmolality and hypernatremia: Secondary | ICD-10-CM

## 2022-04-13 ENCOUNTER — Other Ambulatory Visit: Payer: Self-pay

## 2022-04-13 ENCOUNTER — Telehealth: Payer: Self-pay | Admitting: Physician Assistant

## 2022-04-13 DIAGNOSIS — E870 Hyperosmolality and hypernatremia: Secondary | ICD-10-CM

## 2022-04-13 NOTE — Telephone Encounter (Signed)
Patient's blood pressure has been low and that she has been feeling poorly.  I previously switched her from losartan to valsartan, I think valsartan may be a little bit too strong for her.  I recommended she temporarily hold atenolol, will reduce valsartan to half a tablet daily.  She is going to monitor her blood pressure.  I will follow-up with her regarding her blood pressure on Monday.

## 2022-04-16 ENCOUNTER — Other Ambulatory Visit: Payer: Self-pay | Admitting: Physician Assistant

## 2022-04-16 DIAGNOSIS — E870 Hyperosmolality and hypernatremia: Secondary | ICD-10-CM | POA: Diagnosis not present

## 2022-04-16 MED ORDER — VALSARTAN 160 MG PO TABS
160.0000 mg | ORAL_TABLET | Freq: Every day | ORAL | Status: DC
Start: 2022-04-16 — End: 2022-07-13

## 2022-04-16 NOTE — Telephone Encounter (Signed)
I have called the patient.  Last few blood pressure reading was 120/72, 124/84, 129/70, 111/80.  Blood pressure seems to have stabilized 160 mg daily of valsartan.  Atenolol has been taken off.

## 2022-04-17 LAB — BASIC METABOLIC PANEL
BUN/Creatinine Ratio: 16 (ref 12–28)
BUN: 14 mg/dL (ref 8–27)
CO2: 24 mmol/L (ref 20–29)
Calcium: 9.8 mg/dL (ref 8.7–10.3)
Chloride: 108 mmol/L — ABNORMAL HIGH (ref 96–106)
Creatinine, Ser: 0.89 mg/dL (ref 0.57–1.00)
Glucose: 90 mg/dL (ref 70–99)
Potassium: 4.9 mmol/L (ref 3.5–5.2)
Sodium: 147 mmol/L — ABNORMAL HIGH (ref 134–144)
eGFR: 69 mL/min/{1.73_m2} (ref >59)

## 2022-05-13 ENCOUNTER — Encounter: Payer: Self-pay | Admitting: Emergency Medicine

## 2022-05-13 ENCOUNTER — Ambulatory Visit
Admission: EM | Admit: 2022-05-13 | Discharge: 2022-05-13 | Disposition: A | Discharge status: Home / Self Care | Payer: Medicare HMO | Attending: Internal Medicine | Admitting: Internal Medicine

## 2022-05-13 DIAGNOSIS — H65191 Other acute nonsuppurative otitis media, right ear: Secondary | ICD-10-CM

## 2022-05-13 MED ORDER — AMOXICILLIN-POT CLAVULANATE 875-125 MG PO TABS: 1.0000 | ORAL_TABLET | Freq: Two times a day (BID) | ORAL | 0 refills | Status: DC

## 2022-05-13 NOTE — ED Provider Notes (Signed)
[facility]    CSN: [id number] Arrival date & time: 05/13/22  1109      History   Chief Complaint Chief Complaint  Patient presents with   Otalgia    HPI [name] is a 71 y.o. female.   Patient presents for 3-day history of right ear pain.  Patient reports that she has also had some nasal congestion since symptoms started.  Denies trauma or foreign body to the ear but patient does report that she has noted some blood coming out of her ear at times.  Denies any associated fever.   Otalgia   Past Medical History:  Diagnosis Date   Chronic shoulder bursitis    bilateral--- left > right   GERD (gastroesophageal reflux disease)    History of acute pancreatitis    03-02-2016  w/ sepsis-- resolved   History of cardiac murmur in childhood    History of cellulitis    03-29-2016  chest wall cellulitis w/ possible shingles, resolved   Hypertension    followed by pcp  (07-15-2019 per pt never had stress test)   Hypothyroidism    followed by pcp   Ileitis    dx ED visit in epic 06-07-2019 (07-15-2019  per pt symptoms have resolved)   Malignant neoplasm of upper-outer quadrant of left breast in female, estrogen receptor positive [facility]) oncologist-- dr Lindi Adie   dx 04/ 2016,  DCIS left breast, Stage 0, Grade 2,  ER+/ PR negative-- s/p  left lumpectomy 01-04-2015, re-excision 01-12-2015, left mastectomy w/ node dissection 04-07-2015 with reconstruction;  started tamoxifen 09/ 2016   PMB (postmenopausal bleeding)    PONV (postoperative nausea and vomiting)    Wears glasses     Patient Active Problem List   Diagnosis Date Noted   Diarrhea 80/99/8338   Periumbilical abdominal pain 03/16/2021   Bloating 03/16/2021   Cellulitis of chest wall 03/29/2016   HTN (hypertension) 03/29/2016   Volume overload 03/05/2016   Other acute pancreatitis    Abnormal CT scan, pancreas or bile duct    Severe sepsis ([place])    History of Pancreatitis (July 2017) 03/02/2016    Hypokalemia 03/02/2016   Depression 03/02/2016   Hypothyroidism 03/02/2016   Sepsis ([place]) 03/02/2016   Elevated lipase    Breast cancer, left breast ([place]) 04/07/2015   History of Breast cancer of upper-outer quadrant of left female breast s/p breast reconstruction 01/04/2015   Gall stones 08/14/2011   GERD (gastroesophageal reflux disease) 08/14/2011   Abdominal gas pain 08/14/2011   Anxiety disorder 08/14/2011    Past Surgical History:  Procedure Laterality Date   BREAST BIOPSY Left 12/08/2014; 01/2015   BREAST LUMPECTOMY WITH RADIOACTIVE SEED LOCALIZATION Left 01/04/2015   Procedure: LEFT PARTIAL MASTECTOMY WITH RADIOACTIVE SEED LOCALIZATION;  Surgeon: Fanny Skates, MD;  Location: [facility];  Service: General;  Laterality: Left;   BREAST RECONSTRUCTION WITH PLACEMENT OF TISSUE EXPANDER AND FLEX HD (ACELLULAR HYDRATED DERMIS) Left 04/07/2015   Procedure: IMMEDIATE LEFT BREAST RECONSTRUCTION WITH PLACEMENT SALINE IMPLANT AND CHEST WALL RECONSTRUCTION WITH  ACELLULAR DERMAL MATRIX ;  Surgeon: Crissie Reese, MD;  Location: [place];  Service: Plastics;  Laterality: Left;   CATARACT EXTRACTION W/ INTRAOCULAR LENS  IMPLANT, BILATERAL  2005; 2017   CHOLECYSTECTOMY  09/17/2011   Procedure: LAPAROSCOPIC CHOLECYSTECTOMY WITH INTRAOPERATIVE CHOLANGIOGRAM;  Surgeon: [name], MD;  Location: [place];  Service: General;  Laterality: N/A;  carm    DILATATION & CURETTAGE/HYSTEROSCOPY WITH MYOSURE N/A 07/20/2019   Procedure: DILATATION &  CURETTAGE/HYSTEROSCOPY WITH MYOSURE;  Surgeon: Jerelyn Charles, MD;  Location: [facility];  Service: Gynecology;  Laterality: N/A;   HYSTEROSCOPY WITH D & C N/A 10/28/2015   Procedure: DILATATION AND CURETTAGE /HYSTEROSCOPY, Myosure;  Surgeon: Alden Hipp, MD;  Location: [facility];  Service: Gynecology;  Laterality: N/A;   LIVER SURGERY     MASTECTOMY Left 2016   NIPPLE SPARING MASTECTOMY/SENTINAL LYMPH NODE BIOPSY/RECONSTRUCTION/PLACEMENT OF TISSUE  EXPANDER Left 04/07/2015   Procedure: LEFT NIPPLE SPARING MASTECTOMY WITH LEFT SENTINAL LYMPH NODE BIOPSY AND  RECONSTRUCTION WITH PLACEMENT OF TISSUE EXPANDER;  Surgeon: Fanny Skates, MD;  Location: [place];  Service: General;  Laterality: Left;   RE-EXCISION OF BREAST LUMPECTOMY Left 01/12/2015   Procedure: LEFT  BREAST LUMPECTOMY RE-EXCISION OF MARGINS;  Surgeon: Fanny Skates, MD;  Location: [place];  Service: General;  Laterality: Left;    OB History     Gravida  1   Para  1   Term      Preterm      AB      Living         SAB      IAB      Ectopic      Multiple      Live Births           Obstetric Comments  She menarched at early age of 76 and went to menopause at age 57   She had one pregnancy, her first child was born at age 52 BC x 2-3 years   She had used IUD over 10 years She was never exposed to fertility medications or hormone replacement therapy            Home Medications    Prior to Admission medications   Medication Sig Start Date End Date Taking? Authorizing Provider  amoxicillin-clavulanate (AUGMENTIN) 875-125 MG tablet Take 1 tablet by mouth every 12 (twelve) hours. 05/13/22  Yes Graceanna Theissen, Michele Rockers, FNP  albuterol (VENTOLIN HFA) 108 (90 Base) MCG/ACT inhaler  07/31/21   [provider]  amitriptyline (ELAVIL) 10 MG tablet Take 10 mg by mouth at bedtime as needed.     [provider]  Ascorbic Acid (VITAMIN C) 1000 MG tablet Take 1,000 mg by mouth daily.    [provider]  aspirin 81 MG chewable tablet Chew 81 mg by mouth daily.    [provider]  atorvastatin (LIPITOR) 40 MG tablet Take 40 mg by mouth daily. 02/16/20   [provider]  busPIRone (BUSPAR) 7.5 MG tablet  01/24/21   [provider]  Calcium Carb-Cholecalciferol (CALCIUM + VITAMIN D3 PO) Take 1,200 mg by mouth daily.    [provider]  Cholecalciferol (VITAMIN D3) 50 MCG (2000 UT) TABS Take 1 tablet  by mouth daily.    [provider]  dicyclomine (BENTYL) 10 MG capsule Take 1 capsule (10 mg total) by mouth in the morning and at bedtime. 03/15/21   Zehr, Laban Emperor, PA-C  glucosamine-chondroitin 500-400 MG tablet Take 2 tablets by mouth daily.    [provider]  levothyroxine (SYNTHROID) 50 MCG tablet Take 1 tablet by mouth daily.    [provider]  metFORMIN (GLUCOPHAGE-XR) 500 MG 24 hr tablet Take 1 tablet by mouth daily with breakfast. 11/16/21   [provider]  mirabegron ER (MYRBETRIQ) 50 MG TB24 tablet     [provider]  Multiple Vitamins-Minerals (CENTRUM SILVER 50+WOMEN PO) Take 1 tablet by mouth daily.  [provider]  Omega-3 Fatty Acids (OMEGA-3 FISH OIL) 300 MG CAPS Take 1 capsule by mouth daily.    [provider]  omeprazole (PRILOSEC) 40 MG capsule Take 40 mg by mouth daily.    [provider]  Potassium Citrate,Elemental K, 99 MG CAPS 1 tablet    [provider]  Probiotic CAPS Take 1 capsule by mouth every morning.    [provider]  traZODone (DESYREL) 50 MG tablet Take 50 mg by mouth at bedtime as needed for sleep.    [provider]  valsartan (DIOVAN) 160 MG tablet Take 1 tablet (160 mg total) by mouth daily. 04/16/22   Almyra Deforest, PA  VITAMIN E PO Take by mouth daily.    [provider]  zinc gluconate 50 MG tablet Take 50 mg by mouth daily.    [provider]    Family History Family History  Problem Relation Age of Onset   Heart disease Mother    Hypertension Mother    Cancer Father    Leukemia Father    Heart disease Father    Cancer Brother    Breast cancer Maternal Aunt    Colon cancer Other        Father's aunt   Rectal cancer Neg Hx    Stomach cancer Neg Hx     Social History Social History   Tobacco Use   Smoking status: Former    Packs/day: 0.50    Years: 46.00    Total pack years: 23.00    Types: Cigarettes    Quit date:  11/25/2014    Years since quitting: 7.4   Smokeless tobacco: Never  Vaping Use   Vaping Use: Never used  Substance Use Topics   Alcohol use: Yes    Comment: very rare   Drug use: Never     Allergies   Latex and Other   Review of Systems Review of Systems Per HPI  Physical Exam Triage Vital Signs ED Triage Vitals  Enc Vitals Group     BP 05/13/22 1157 (!) 144/82     Pulse Rate 05/13/22 1157 73     Resp 05/13/22 1157 18     Temp 05/13/22 1157 98.2 F (36.8 C)     Temp src --      SpO2 05/13/22 1157 98 %     Weight --      Height --      Head Circumference --      Peak Flow --      Pain Score 05/13/22 1156 5     Pain Loc --      Pain Edu? --      Excl. in [place]? --    No data found.  Updated Vital Signs BP (!) 144/82   Pulse 73   Temp 98.2 F (36.8 C)   Resp 18   SpO2 98%   Visual Acuity Right Eye Distance:   Left Eye Distance:   Bilateral Distance:    Right Eye Near:   Left Eye Near:    Bilateral Near:     Physical Exam Constitutional:      General: She is not in acute distress.    Appearance: Normal appearance. She is not toxic-appearing or diaphoretic.  HENT:     Head: Normocephalic and atraumatic.     Right Ear: Ear canal normal. No drainage, swelling or tenderness. A middle ear effusion is present. There is no impacted cerumen. No foreign body. No  mastoid tenderness. Tympanic membrane is bulging. Tympanic membrane is not perforated or erythematous.     Left Ear: Tympanic membrane and ear canal normal.     Ears:     Comments: Fluid behind TM appears mildly cloudy but not erythematous.    Nose: Congestion present.     Mouth/Throat:     Mouth: Mucous membranes are moist.     Pharynx: No posterior oropharyngeal erythema.  Eyes:     Extraocular Movements: Extraocular movements intact.     Conjunctiva/sclera: Conjunctivae normal.     Pupils: Pupils are equal, round, and reactive to light.  Cardiovascular:     Rate and Rhythm: Normal rate and  regular rhythm.     Pulses: Normal pulses.     Heart sounds: Normal heart sounds.  Pulmonary:     Effort: Pulmonary effort is normal. No respiratory distress.     Breath sounds: Normal breath sounds. No stridor. No wheezing, rhonchi or rales.  Abdominal:     General: Abdomen is flat. Bowel sounds are normal.     Palpations: Abdomen is soft.  Musculoskeletal:        General: Normal range of motion.     Cervical back: Normal range of motion.  Skin:    General: Skin is warm and dry.  Neurological:     General: No focal deficit present.     Mental Status: She is alert and oriented to person, place, and time. Mental status is at baseline.  Psychiatric:        Mood and Affect: Mood normal.        Behavior: Behavior normal.      UC Treatments / Results  Labs (all labs ordered are listed, but only abnormal results are displayed) Labs Reviewed - No data to display  EKG   Radiology No results found.  Procedures Procedures (including critical care time)  Medications Ordered in UC Medications - No data to display  Initial Impression / Assessment and Plan / UC Course  I have reviewed the triage vital signs and the nursing notes.  Pertinent labs & imaging results that were available during my care of the patient were reviewed by me and considered in my medical decision making (see chart for details).     Patient has right middle ear effusion causing discomfort.  No signs of perforated TM, blood, trauma noted.  Given associated cloudiness to fluid will opt to treat with Augmentin antibiotic.  Patient also has possible upper respiratory infection versus viral upper respiratory infection with nasal congestion associated with symptoms.  Patient was given strict return precautions.  Patient verbalized understanding and was agreeable with plan. Final Clinical Impressions(s) / UC Diagnoses   Final diagnoses:  Acute MEE (middle ear effusion), right     Discharge Instructions       You have fluid behind your eardrum causing your discomfort.  You have been prescribed an antibiotic to alleviate this.  Please follow-up if symptoms persist or worsen.    ED Prescriptions     Medication Sig Dispense Auth. Provider   amoxicillin-clavulanate (AUGMENTIN) 875-125 MG tablet Take 1 tablet by mouth every 12 (twelve) hours. 14 tablet [place], Michele Rockers, [place]      PDMP not reviewed this encounter.   Teodora Medici, [place] 05/13/22 1328

## 2022-05-13 NOTE — Discharge Instructions (Addendum)
You have fluid behind your eardrum causing your discomfort.  You have been prescribed an antibiotic to alleviate this.  Please follow-up if symptoms persist or worsen.

## 2022-05-13 NOTE — ED Triage Notes (Signed)
Pt is present today with c/o right ear pain. Pt states that her pain started x3 days ago

## 2022-05-21 ENCOUNTER — Ambulatory Visit: Payer: Medicare HMO | Admitting: General Practice

## 2022-05-22 ENCOUNTER — Telehealth (HOSPITAL_COMMUNITY): Payer: Self-pay | Admitting: *Deleted

## 2022-05-22 NOTE — Telephone Encounter (Signed)
Attempted to call patient regarding upcoming cardiac MRI appointment. Left message on voicemail with name and callback number  Abel Hageman RN Navigator Cardiac Imaging [facility] [phone] Office [phone] Cell  

## 2022-05-23 ENCOUNTER — Ambulatory Visit (HOSPITAL_COMMUNITY)
Admission: RE | Admit: 2022-05-23 | Discharge: 2022-05-23 | Disposition: A | Discharge status: Home / Self Care | Payer: Medicare HMO | Source: Ambulatory Visit | Attending: Physician Assistant | Admitting: Physician Assistant

## 2022-05-23 ENCOUNTER — Other Ambulatory Visit: Payer: Self-pay | Admitting: Physician Assistant

## 2022-05-23 DIAGNOSIS — I340 Nonrheumatic mitral (valve) insufficiency: Secondary | ICD-10-CM | POA: Diagnosis not present

## 2022-05-23 MED ORDER — GADOBUTROL 1 MMOL/ML IV SOLN: 11.0000 mL | Freq: Once | INTRAVENOUS | Status: DC | PRN

## 2022-05-23 MED ORDER — GADOBUTROL 1 MMOL/ML IV SOLN
8.0000 mL | Freq: Once | INTRAVENOUS | Status: AC | PRN
  Administered 2022-05-23: 8 mL via INTRAVENOUS

## 2022-05-27 NOTE — Progress Notes (Signed)
Cardiology Clinic Note   Patient Name: [name] Date of Encounter: 05/28/2022  Primary Care Provider:  Michael Boston, MD Primary Cardiologist:  Elouise Munroe, MD  Patient Profile    [name] 71 year old female presents to the clinic today for follow-up evaluation of her mitral valve regurgitation and essential hypertension.  Past Medical History    Past Medical History:  Diagnosis Date   Chronic shoulder bursitis    bilateral--- left > right   GERD (gastroesophageal reflux disease)    History of acute pancreatitis    03-02-2016  w/ sepsis-- resolved   History of cardiac murmur in childhood    History of cellulitis    03-29-2016  chest wall cellulitis w/ possible shingles, resolved   Hypertension    followed by pcp  (07-15-2019 per pt never had stress test)   Hypothyroidism    followed by pcp   Ileitis    dx ED visit in epic 06-07-2019 (07-15-2019  per pt symptoms have resolved)   Malignant neoplasm of upper-outer quadrant of left breast in female, estrogen receptor positive [facility]) oncologist-- dr Lindi Adie   dx 04/ 2016,  DCIS left breast, Stage 0, Grade 2,  ER+/ PR negative-- s/p  left lumpectomy 01-04-2015, re-excision 01-12-2015, left mastectomy w/ node dissection 04-07-2015 with reconstruction;  started tamoxifen 09/ 2016   PMB (postmenopausal bleeding)    PONV (postoperative nausea and vomiting)    Wears glasses    Past Surgical History:  Procedure Laterality Date   BREAST BIOPSY Left 12/08/2014; 01/2015   BREAST LUMPECTOMY WITH RADIOACTIVE SEED LOCALIZATION Left 01/04/2015   Procedure: LEFT PARTIAL MASTECTOMY WITH RADIOACTIVE SEED LOCALIZATION;  Surgeon: Fanny Skates, MD;  Location: [place];  Service: General;  Laterality: Left;   BREAST RECONSTRUCTION WITH PLACEMENT OF TISSUE EXPANDER AND FLEX HD (ACELLULAR HYDRATED DERMIS) Left 04/07/2015   Procedure: IMMEDIATE LEFT BREAST RECONSTRUCTION WITH PLACEMENT SALINE IMPLANT AND CHEST WALL RECONSTRUCTION  WITH  ACELLULAR DERMAL MATRIX ;  Surgeon: Crissie Reese, MD;  Location: [place];  Service: Plastics;  Laterality: Left;   CATARACT EXTRACTION W/ INTRAOCULAR LENS  IMPLANT, BILATERAL  2005; 2017   CHOLECYSTECTOMY  09/17/2011   Procedure: LAPAROSCOPIC CHOLECYSTECTOMY WITH INTRAOPERATIVE CHOLANGIOGRAM;  Surgeon: [name], MD;  Location: [place];  Service: General;  Laterality: N/A;  carm    DILATATION & CURETTAGE/HYSTEROSCOPY WITH MYOSURE N/A 07/20/2019   Procedure: DILATATION & CURETTAGE/HYSTEROSCOPY WITH MYOSURE;  Surgeon: Jerelyn Charles, MD;  Location: [place];  Service: Gynecology;  Laterality: N/A;   HYSTEROSCOPY WITH D & C N/A 10/28/2015   Procedure: DILATATION AND CURETTAGE /HYSTEROSCOPY, Myosure;  Surgeon: Alden Hipp, MD;  Location: [facility];  Service: Gynecology;  Laterality: N/A;   LIVER SURGERY     MASTECTOMY Left 2016   NIPPLE SPARING MASTECTOMY/SENTINAL LYMPH NODE BIOPSY/RECONSTRUCTION/PLACEMENT OF TISSUE EXPANDER Left 04/07/2015   Procedure: LEFT NIPPLE SPARING MASTECTOMY WITH LEFT SENTINAL LYMPH NODE BIOPSY AND  RECONSTRUCTION WITH PLACEMENT OF TISSUE EXPANDER;  Surgeon: Fanny Skates, MD;  Location: [place];  Service: General;  Laterality: Left;   RE-EXCISION OF BREAST LUMPECTOMY Left 01/12/2015   Procedure: LEFT  BREAST LUMPECTOMY RE-EXCISION OF MARGINS;  Surgeon: Fanny Skates, MD;  Location: [place];  Service: General;  Laterality: Left;    Allergies  Allergies  Allergen Reactions   Latex Itching and Rash   Other Itching and Rash    Patient states that she was unable to use supportive wrap they provided at [facility] following a breast removal and reconstruction.    History of Present Illness    Anam A Cid has a PMH of HTN, hypothyroidism, breast cancer in 2016 with mastectomy, pancreatitis secondary to HCTZ, and GERD.  She underwent nuclear stress testing 5/21 which showed an EF of 66% and normal perfusion.  She has a history  of mitral valve regurgitation which was identified on echocardiogram 6/21.  Her EF was 60 to 65%.  She was noted to have intermittent bouts of chest discomfort when seen by Dr. Margaretann Loveless 03/01/2021.  Dr. Margaretann Loveless recommended as needed nitroglycerin.  Her echocardiogram 03/01/2022 showed an EF of 50-55%, no regional wall motion abnormalities, and moderate-severe mitral regurgitation.  She was seen by Almyra Deforest PA-C on 04/01/2022.  During that time she denied any worsening DOE.  She was not very active at home.  She noted chronic intermittent episodes of chest discomfort about once a week.  Her episodes would last for about 1 minute.  This was the same chest discomfort that she noted prior to her stress testing in 2021.  She reported that the frequency and duration of the chest discomfort had improved.  She denied dizziness, lower extremity swelling orthopnea and PND.  Cardiac MRI was discussed.  It was felt that holding off TEE was warranted due to no obvious heart failure type symptoms.  Follow-up was planned for 2 months.  She presents to the clinic today for follow-up evaluation and states she has been increasing her physical activity.  She is now doing resistance training as well as walking 3 times per week.  She reports that she is exceeding 150 minutes/week of physical activity.  She continues to lose weight.  Her blood pressure at home is 111-120 over 70s.  Initially in the clinic today her blood pressure is 140/82 and on recheck is 136/72.  We reviewed her cardiac MRI and she expressed understanding.  I will give her the salty 6 diet sheet, have her continue her current physical activity, continue her current medication regimen and plan follow-up in 9 to 12 months.  She presented to the emergency department on 05/13/2022 with middle ear effusion.  She was prescribed Augmentin.  Today she denies chest pain, shortness of breath, lower extremity edema, fatigue, palpitations, melena, hematuria, hemoptysis,  diaphoresis, weakness, presyncope, syncope, orthopnea, and PND.  Home Medications    Prior to Admission medications   Medication Sig Start Date End Date Taking? Authorizing Provider  albuterol (VENTOLIN HFA) 108 (90 Base) MCG/ACT inhaler  07/31/21   [provider]  amitriptyline (ELAVIL) 10 MG tablet Take 10 mg by mouth at bedtime as needed.     [provider]  amoxicillin-clavulanate (AUGMENTIN) 875-125 MG tablet Take 1 tablet by mouth every 12 (twelve) hours. 05/13/22   Teodora Medici, FNP  Ascorbic Acid (VITAMIN C) 1000 MG tablet Take 1,000 mg by mouth daily.    [provider]  aspirin 81 MG chewable tablet Chew 81 mg by mouth daily.    [provider]  atorvastatin (LIPITOR) 40 MG tablet Take 40 mg by mouth daily. 02/16/20   [provider]  busPIRone (BUSPAR) 7.5 MG tablet  01/24/21   [provider]  Calcium Carb-Cholecalciferol (CALCIUM + VITAMIN D3 PO) Take 1,200 mg by mouth daily.    [provider]  Cholecalciferol (VITAMIN D3) 50 MCG (2000 UT) TABS Take 1 tablet by mouth daily.    [provider]  dicyclomine (BENTYL) 10 MG capsule Take 1 capsule (  10 mg total) by mouth in the morning and at bedtime. 03/15/21   Zehr, Laban Emperor, PA-C  glucosamine-chondroitin 500-400 MG tablet Take 2 tablets by mouth daily.    [provider]  levothyroxine (SYNTHROID) 50 MCG tablet Take 1 tablet by mouth daily.    [provider]  metFORMIN (GLUCOPHAGE-XR) 500 MG 24 hr tablet Take 1 tablet by mouth daily with breakfast. 11/16/21   [provider]  mirabegron ER (MYRBETRIQ) 50 MG TB24 tablet     [provider]  Multiple Vitamins-Minerals (CENTRUM SILVER 50+WOMEN PO) Take 1 tablet by mouth daily.    [provider]  Omega-3 Fatty Acids (OMEGA-3 FISH OIL) 300 MG CAPS Take 1 capsule by mouth daily.    [provider]  omeprazole (PRILOSEC) 40 MG capsule Take 40 mg by mouth daily.     [provider]  Potassium Citrate,Elemental K, 99 MG CAPS 1 tablet    [provider]  Probiotic CAPS Take 1 capsule by mouth every morning.    [provider]  traZODone (DESYREL) 50 MG tablet Take 50 mg by mouth at bedtime as needed for sleep.    [provider]  valsartan (DIOVAN) 160 MG tablet Take 1 tablet (160 mg total) by mouth daily. 04/16/22   Almyra Deforest, PA  VITAMIN E PO Take by mouth daily.    [provider]  zinc gluconate 50 MG tablet Take 50 mg by mouth daily.    [provider]    Family History    Family History  Problem Relation Age of Onset   Heart disease Mother    Hypertension Mother    Cancer Father    Leukemia Father    Heart disease Father    Cancer Brother    Breast cancer Maternal Aunt    Colon cancer Other        Father's aunt   Rectal cancer Neg Hx    Stomach cancer Neg Hx    She indicated that her mother is alive. She indicated that her father is deceased. She indicated that her brother is deceased. She indicated that the status of her maternal aunt is unknown. She indicated that the status of her neg hx is unknown. She indicated that the status of her other is unknown.  Social History    Social History   Socioeconomic History   Marital status: Divorced    Spouse name: Not on file   Number of children: 1   Years of education: Not on file   Highest education level: Not on file  Occupational History   Occupation: Retired  Tobacco Use   Smoking status: Former    Packs/day: 0.50    Years: 46.00    Total pack years: 23.00    Types: Cigarettes    Quit date: 11/25/2014    Years since quitting: 7.5   Smokeless tobacco: Never  Vaping Use   Vaping Use: Never used  Substance and Sexual Activity   Alcohol use: Yes    Comment: very rare   Drug use: Never   Sexual activity: Not on file  Other Topics Concern   Not on file  Social History Narrative   Not on file   Social Determinants of Health    Financial Resource Strain: Not on file  Food Insecurity: Not on file  Transportation Needs: Not on file  Physical Activity: Not on file  Stress: Not on file  Social Connections: Not on file  Intimate Partner Violence:  Not on file     Review of Systems    General:  No chills, fever, night sweats or weight changes.  Cardiovascular:  No chest pain, dyspnea on exertion, edema, orthopnea, palpitations, paroxysmal nocturnal dyspnea. Dermatological: No rash, lesions/masses Respiratory: No cough, dyspnea Urologic: No hematuria, dysuria Abdominal:   No nausea, vomiting, diarrhea, bright red blood per rectum, melena, or hematemesis Neurologic:  No visual changes, wkns, changes in mental status. All other systems reviewed and are otherwise negative except as noted above.  Physical Exam    VS:  BP 136/72   Pulse 62   Ht '5\' 3"'$  (1.6 m)   Wt 176 lb (79.8 kg)   BMI 31.18 kg/m  , BMI Body mass index is 31.18 kg/m. GEN: Well nourished, well developed, in no acute distress. HEENT: normal. Neck: Supple, no JVD, carotid bruits, or masses. Cardiac: RRR, no murmurs, rubs, or gallops. No clubbing, cyanosis, edema.  Radials/DP/PT 2+ and equal bilaterally.  Respiratory:  Respirations regular and unlabored, clear to auscultation bilaterally. GI: Soft, nontender, nondistended, BS + x 4. MS: no deformity or atrophy. Skin: warm and dry, no rash. Neuro:  Strength and sensation are intact. Psych: Normal affect.  Accessory Clinical Findings    Recent Labs: 04/16/2022: BUN 14; Creatinine, Ser 0.89; Potassium 4.9; Sodium 147   Recent Lipid Panel    Component Value Date/Time   CHOL 138 03/02/2016 1040   TRIG 94 03/02/2016 1040   HDL 53 03/02/2016 1040   CHOLHDL 2.6 03/02/2016 1040   VLDL 19 03/02/2016 1040   LDLCALC 66 03/02/2016 1040         ECG personally reviewed by me today-normal sinus rhythm with sinus arrhythmia 62 bpm- No acute changes  EKG 03/05/2022  Sinus bradycardia poor R  wave progression in anterior leads, pulse 51 bpm  Echocardiogram 03/01/2022  1. Left ventricular ejection fraction, by estimation, is 50 to 55%. The  left ventricle has low normal function. The left ventricle has no regional  wall motion abnormalities. Left ventricular diastolic parameters were  normal. The average left ventricular   global longitudinal strain is -16.9 %. The global longitudinal strain is  normal.   2. Right ventricular systolic function is normal. The right ventricular  size is normal. There is normal pulmonary artery systolic pressure.   3. The mitral valve is normal in structure. Moderate to severe mitral  valve regurgitation.   4. The aortic valve is tricuspid. Aortic valve regurgitation is not  visualized. No aortic stenosis is present.   Cardiac MRI 05/23/2022  IMPRESSION: 1.  Normal LV size, wall thickness, and systolic function (EF 53%)   2.  Normal RV size and systolic function (EF 66%)   3.  No late gadolinium enhancement to suggest myocardial scar   4.  Mild to moderate mitral regurgitation (regurgitant fraction 19%)     Electronically Signed   By: Oswaldo Milian M.D.   On: 05/23/2022 22:41  Assessment & Plan   1.  Mitral valve regurgitation-no increased DOE or activity intolerance.  Dr. Margaretann Loveless previously recommended cardiac MRI to quantify degree of mitral regurgitation.  MRI 05/23/2022 showed mild-moderate mitral regurgitation, trivial aortic valve regurgitation, mild tricuspid valve regurgitation, trivial pulmonic valve regurgitation and trivial pericardial effusion.  Her EF was noted to be 55%.  No late gadolinium enhancement. Will defer TEE at this time.  Essential hypertension-BP today 136/72.  Well-controlled at home. Continue valsartan Heart healthy low-sodium diet-salty 6 given Increase physical activity as tolerated  Atypical chest  pain-chronic.  Stable.  Previously noted to be worse and more frequent.  Stress testing 2021 showed  low risk and normal perfusion. No plans for ischemic evaluation at this time  Disposition: Follow-up with Dr. Margaretann Loveless 9-12 months.  Jossie Ng. Athalene Kolle NP-C     05/28/2022, 10:50 AM [address] [address] Office [phone] Fax [phone]  Notice: This dictation was prepared with Dragon dictation along with smaller phrase technology. Any transcriptional errors that result from this process are unintentional and may not be corrected upon review.  I spent 14 minutes examining this patient, reviewing medications, and using patient centered shared decision making involving her cardiac care.  Prior to her visit I spent greater than 20 minutes reviewing her past medical history,  medications, and prior cardiac tests.

## 2022-05-28 ENCOUNTER — Ambulatory Visit: Payer: Medicare HMO | Attending: General Practice | Admitting: General Practice

## 2022-05-28 ENCOUNTER — Encounter: Payer: Self-pay | Admitting: General Practice

## 2022-05-28 VITALS — BP 136/72 | HR 62 | Ht 63.0 in | Wt 176.0 lb

## 2022-05-28 DIAGNOSIS — R0789 Other chest pain: Secondary | ICD-10-CM

## 2022-05-28 DIAGNOSIS — I340 Nonrheumatic mitral (valve) insufficiency: Secondary | ICD-10-CM

## 2022-05-28 DIAGNOSIS — I10 Essential (primary) hypertension: Secondary | ICD-10-CM

## 2022-05-28 NOTE — Patient Instructions (Signed)
Medication Instructions:  The current medical regimen is effective;  continue present plan and medications as directed. Please refer to the Current Medication list given to you today.  *If you need a refill on your cardiac medications before your next appointment, please call your pharmacy*  Lab Work: NONE  If you have any lab test that is abnormal or we need to change your treatment, we will call you to review the results.  Other Instructions PLEASE READ AND FOLLOW ATTACHED  SALTY 6  PLEASE MAINTAIN PHYSICAL ACTIVITY  Follow-Up: At [facility], you and your health needs are our priority.  As part of our continuing mission to provide you with exceptional heart care, we have created designated Provider Care Teams.  These Care Teams include your primary Cardiologist (physician) and Advanced Practice Providers (APPs -  Physician Assistants and Nurse Practitioners) who all work together to provide you with the care you need, when you need it.  Your next appointment:   9-12 month(s)  The format for your next appointment:   In Person  Provider:   Elouise Munroe, MD    Important Information About Sugar

## 2022-05-31 ENCOUNTER — Ambulatory Visit (INDEPENDENT_AMBULATORY_CARE_PROVIDER_SITE_OTHER): Payer: Medicare HMO

## 2022-05-31 ENCOUNTER — Ambulatory Visit
Admission: EM | Admit: 2022-05-31 | Discharge: 2022-05-31 | Disposition: A | Discharge status: Home / Self Care | Payer: Medicare HMO | Attending: Physician Assistant | Admitting: Physician Assistant

## 2022-05-31 DIAGNOSIS — M79632 Pain in left forearm: Secondary | ICD-10-CM

## 2022-05-31 DIAGNOSIS — W19XXXA Unspecified fall, initial encounter: Secondary | ICD-10-CM | POA: Diagnosis not present

## 2022-05-31 DIAGNOSIS — S5012XA Contusion of left forearm, initial encounter: Secondary | ICD-10-CM | POA: Diagnosis not present

## 2022-05-31 NOTE — Discharge Instructions (Addendum)
Advised to take only Tylenol for pain relief for the next several days. Advised to use ice therapy today, 10 minutes on 20 minutes off, 3-4 times throughout the day. Tomorrow alternate ice and heat, 10 minutes on 20 minutes off, 4-5 times throughout the day over the next couple days. The hematoma will tend to resolve and the bruising will travel down the arm due to gravity pulling it down over the next several days and in week.  Usually these hematomas will take a couple weeks for it to totally resolve and dissipate and return to normal. As to follow-up with PCP or return to urgent care if symptoms fail to improve.

## 2022-05-31 NOTE — ED Provider Notes (Signed)
[facility]    CSN: [id number] Arrival date & time: 05/31/22  1111      History   Chief Complaint Chief Complaint  Patient presents with   Fall    HPI [name] is a 71 y.o. female.   71 year old female presents with left arm pain.  She indicates this morning she was walking on a trail when she tripped and fell onto her left forearm.  She relates that she had immediate swelling of the upper left forearm after it occurred, mild pain and discomfort.  She relates that she has been on a baby aspirin a day for quite a while.  She relates that she is having minimal pain at the site of the hematoma, range of motion does not cause discomfort, she denies any numbness or tingling or weakness.  She relates she came in because her daughter wanted her to be evaluated.   Fall    Past Medical History:  Diagnosis Date   Chronic shoulder bursitis    bilateral--- left > right   GERD (gastroesophageal reflux disease)    History of acute pancreatitis    03-02-2016  w/ sepsis-- resolved   History of cardiac murmur in childhood    History of cellulitis    03-29-2016  chest wall cellulitis w/ possible shingles, resolved   Hypertension    followed by pcp  (07-15-2019 per pt never had stress test)   Hypothyroidism    followed by pcp   Ileitis    dx ED visit in epic 06-07-2019 (07-15-2019  per pt symptoms have resolved)   Malignant neoplasm of upper-outer quadrant of left breast in female, estrogen receptor positive [facility]) oncologist-- dr Lindi Adie   dx 04/ 2016,  DCIS left breast, Stage 0, Grade 2,  ER+/ PR negative-- s/p  left lumpectomy 01-04-2015, re-excision 01-12-2015, left mastectomy w/ node dissection 04-07-2015 with reconstruction;  started tamoxifen 09/ 2016   PMB (postmenopausal bleeding)    PONV (postoperative nausea and vomiting)    Wears glasses     Patient Active Problem List   Diagnosis Date Noted   Diarrhea 73/42/8768   Periumbilical abdominal pain 03/16/2021    Bloating 03/16/2021   Cellulitis of chest wall 03/29/2016   HTN (hypertension) 03/29/2016   Volume overload 03/05/2016   Other acute pancreatitis    Abnormal CT scan, pancreas or bile duct    Severe sepsis ([place])    History of Pancreatitis (July 2017) 03/02/2016   Hypokalemia 03/02/2016   Depression 03/02/2016   Hypothyroidism 03/02/2016   Sepsis ([place]) 03/02/2016   Elevated lipase    Breast cancer, left breast ([place]) 04/07/2015   History of Breast cancer of upper-outer quadrant of left female breast s/p breast reconstruction 01/04/2015   Gall stones 08/14/2011   GERD (gastroesophageal reflux disease) 08/14/2011   Abdominal gas pain 08/14/2011   Anxiety disorder 08/14/2011    Past Surgical History:  Procedure Laterality Date   BREAST BIOPSY Left 12/08/2014; 01/2015   BREAST LUMPECTOMY WITH RADIOACTIVE SEED LOCALIZATION Left 01/04/2015   Procedure: LEFT PARTIAL MASTECTOMY WITH RADIOACTIVE SEED LOCALIZATION;  Surgeon: Fanny Skates, MD;  Location: [facility];  Service: General;  Laterality: Left;   BREAST RECONSTRUCTION WITH PLACEMENT OF TISSUE EXPANDER AND FLEX HD (ACELLULAR HYDRATED DERMIS) Left 04/07/2015   Procedure: IMMEDIATE LEFT BREAST RECONSTRUCTION WITH PLACEMENT SALINE IMPLANT AND CHEST WALL RECONSTRUCTION WITH  ACELLULAR DERMAL MATRIX ;  Surgeon: Crissie Reese, MD;  Location: [place];  Service: Plastics;  Laterality: Left;   CATARACT EXTRACTION  W/ INTRAOCULAR LENS  IMPLANT, BILATERAL  2005; 2017   CHOLECYSTECTOMY  09/17/2011   Procedure: LAPAROSCOPIC CHOLECYSTECTOMY WITH INTRAOPERATIVE CHOLANGIOGRAM;  Surgeon: [name], MD;  Location: [place];  Service: General;  Laterality: N/A;  carm    DILATATION & CURETTAGE/HYSTEROSCOPY WITH MYOSURE N/A 07/20/2019   Procedure: DILATATION & CURETTAGE/HYSTEROSCOPY WITH MYOSURE;  Surgeon: Jerelyn Charles, MD;  Location: [place];  Service: Gynecology;  Laterality: N/A;   HYSTEROSCOPY WITH D & C N/A 10/28/2015   Procedure:  DILATATION AND CURETTAGE /HYSTEROSCOPY, Myosure;  Surgeon: Alden Hipp, MD;  Location: [facility];  Service: Gynecology;  Laterality: N/A;   LIVER SURGERY     MASTECTOMY Left 2016   NIPPLE SPARING MASTECTOMY/SENTINAL LYMPH NODE BIOPSY/RECONSTRUCTION/PLACEMENT OF TISSUE EXPANDER Left 04/07/2015   Procedure: LEFT NIPPLE SPARING MASTECTOMY WITH LEFT SENTINAL LYMPH NODE BIOPSY AND  RECONSTRUCTION WITH PLACEMENT OF TISSUE EXPANDER;  Surgeon: Fanny Skates, MD;  Location: [place];  Service: General;  Laterality: Left;   RE-EXCISION OF BREAST LUMPECTOMY Left 01/12/2015   Procedure: LEFT  BREAST LUMPECTOMY RE-EXCISION OF MARGINS;  Surgeon: Fanny Skates, MD;  Location: [place];  Service: General;  Laterality: Left;    OB History     Gravida  1   Para  1   Term      Preterm      AB      Living         SAB      IAB      Ectopic      Multiple      Live Births           Obstetric Comments  She menarched at early age of 73 and went to menopause at age 31   She had one pregnancy, her first child was born at age 87 BC x 2-3 years   She had used IUD over 10 years She was never exposed to fertility medications or hormone replacement therapy            Home Medications    Prior to Admission medications   Medication Sig Start Date End Date Taking? Authorizing Provider  albuterol (VENTOLIN HFA) 108 (90 Base) MCG/ACT inhaler  07/31/21   [provider]  amitriptyline (ELAVIL) 10 MG tablet Take 10 mg by mouth at bedtime as needed.     [provider]  Ascorbic Acid (VITAMIN C) 1000 MG tablet Take 1,000 mg by mouth daily.    [provider]  aspirin 81 MG chewable tablet Chew 81 mg by mouth daily.    [provider]  atorvastatin (LIPITOR) 40 MG tablet Take 40 mg by mouth daily. 02/16/20   [provider]  busPIRone (BUSPAR) 7.5 MG tablet  01/24/21   [provider]  Calcium Carb-Cholecalciferol (CALCIUM +  VITAMIN D3 PO) Take 1,200 mg by mouth daily.    [provider]  Cholecalciferol (VITAMIN D3) 50 MCG (2000 UT) TABS Take 1 tablet by mouth daily.    [provider]  dicyclomine (BENTYL) 10 MG capsule Take 1 capsule (10 mg total) by mouth in the morning and at bedtime. 03/15/21   Zehr, Laban Emperor, PA-C  glucosamine-chondroitin 500-400 MG tablet Take 2 tablets by mouth daily.    [provider]  levothyroxine (SYNTHROID) 50 MCG tablet Take 1 tablet by mouth daily.    [provider]  metFORMIN (GLUCOPHAGE-XR) 500 MG 24 hr tablet Take 1 tablet by mouth daily with breakfast. 11/16/21  [provider]  mirabegron ER (MYRBETRIQ) 50 MG TB24 tablet     [provider]  Multiple Vitamins-Minerals (CENTRUM SILVER 50+WOMEN PO) Take 1 tablet by mouth daily.    [provider]  Omega-3 Fatty Acids (OMEGA-3 FISH OIL) 300 MG CAPS Take 1 capsule by mouth daily.    [provider]  omeprazole (PRILOSEC) 40 MG capsule Take 40 mg by mouth daily.    [provider]  Potassium Citrate,Elemental K, 99 MG CAPS 1 tablet    [provider]  Probiotic CAPS Take 1 capsule by mouth every morning.    [provider]  traZODone (DESYREL) 50 MG tablet Take 50 mg by mouth at bedtime as needed for sleep.    [provider]  valsartan (DIOVAN) 160 MG tablet Take 1 tablet (160 mg total) by mouth daily. 04/16/22   Almyra Deforest, PA  VITAMIN E PO Take by mouth daily.    [provider]  zinc gluconate 50 MG tablet Take 50 mg by mouth daily.    [provider]    Family History Family History  Problem Relation Age of Onset   Heart disease Mother    Hypertension Mother    Cancer Father    Leukemia Father    Heart disease Father    Cancer Brother    Breast cancer Maternal Aunt    Colon cancer Other        Father's aunt   Rectal cancer Neg Hx    Stomach cancer Neg Hx     Social History Social History    Tobacco Use   Smoking status: Former    Packs/day: 0.50    Years: 46.00    Total pack years: 23.00    Types: Cigarettes    Quit date: 11/25/2014    Years since quitting: 7.5   Smokeless tobacco: Never  Vaping Use   Vaping Use: Never used  Substance Use Topics   Alcohol use: Yes    Comment: very rare   Drug use: Never     Allergies   Latex and Other   Review of Systems Review of Systems  Musculoskeletal:  Positive for joint swelling (left forearm hematoma).     Physical Exam Triage Vital Signs ED Triage Vitals [05/31/22 1130]  Enc Vitals Group     BP (!) 152/84     Pulse Rate 61     Resp 18     Temp 97.9 F (36.6 C)     Temp Source Oral     SpO2 98 %     Weight      Height      Head Circumference      Peak Flow      Pain Score 1     Pain Loc      Pain Edu?      Excl. in [place]?    No data found.  Updated Vital Signs BP (!) 152/84 (BP Location: Right Arm)   Pulse 61   Temp 97.9 F (36.6 C) (Oral)   Resp 18   SpO2 98%   Visual Acuity Right Eye Distance:   Left Eye Distance:   Bilateral Distance:    Right Eye Near:   Left Eye Near:    Bilateral Near:     Physical Exam Constitutional:      Appearance: Normal appearance.  Musculoskeletal:     Left forearm: Swelling (hematoma) present.       Arms:     Comments:  Left shoulder: There is no pain to palpation along the shoulder joint line, full range of motion is normal without restriction.  Left forearm: There is a 4 x 3 cm raised 1 cm hematoma with associated bruising.  There is no tenderness on palpation of the elbow, or wrist area.  Supination and pronation are normal without restrictions or pain.  Flexion and extension is normal of the wrist and elbow without restriction or pain.   Neurological:     Mental Status: She is alert.      UC Treatments / Results  Labs (all labs ordered are listed, but only abnormal results are displayed) Labs Reviewed - No data to  display  EKG   Radiology DG Forearm Left  Result Date: 05/31/2022 CLINICAL DATA:  Fall onto left forearm with hematoma. EXAM: LEFT FOREARM - 2 VIEW COMPARISON:  None Available. FINDINGS: There is no acute fracture or dislocation. Bony alignment is normal. There is no erosive change. Hyperdensity within the soft tissues along the dorsal aspect of the proximal forearm may reflect the reported clinically apparent hematoma. IMPRESSION: No acute fracture or dislocation. Hyperdensity in the soft tissues along the dorsal aspect of the proximal forearm may be related to the clinically apparent hematoma. Electronically Signed   By: Valetta Mole M.D.   On: 05/31/2022 11:51    Procedures Procedures (including critical care time)  Medications Ordered in UC Medications - No data to display  Initial Impression / Assessment and Plan / UC Course  I have reviewed the triage vital signs and the nursing notes.  Pertinent labs & imaging results that were available during my care of the patient were reviewed by me and considered in my medical decision making (see chart for details).    Plan: 1.  The left forearm hematoma will be treated with the following: A.  Tylenol every 6 hours to help reduce forearm pain. B.  Advised ice therapy, 10 minutes on 20 minutes off, 4-5 times throughout the day today.  Then the patient is advised to use alternating ice and heat, 10 minutes on 20 minutes off, 4-5 times throughout the day for the next couple days until symptoms improve. C.  Patient advised to follow-up with PCP or return to urgent care if symptoms fail to improve. Final Clinical Impressions(s) / UC Diagnoses   Final diagnoses:  Contusion of left forearm, initial encounter  Traumatic hematoma of left forearm, initial encounter     Discharge Instructions      Advised to take only Tylenol for pain relief for the next several days. Advised to use ice therapy today, 10 minutes on 20 minutes off, 3-4 times  throughout the day. Tomorrow alternate ice and heat, 10 minutes on 20 minutes off, 4-5 times throughout the day over the next couple days. The hematoma will tend to resolve and the bruising will travel down the arm due to gravity pulling it down over the next several days and in week.  Usually these hematomas will take a couple weeks for it to totally resolve and dissipate and return to normal. As to follow-up with PCP or return to urgent care if symptoms fail to improve.    ED Prescriptions   None    PDMP not reviewed this encounter.   Nyoka Lint, PA-C 05/31/22 1226

## 2022-05-31 NOTE — ED Triage Notes (Signed)
Pt states walking on a trail and tripped falling on lt arm. Hematoma noted to lt lateral forearm. States is on a baby asa.

## 2022-07-12 ENCOUNTER — Telehealth: Payer: Self-pay | Admitting: Internal Medicine

## 2022-07-12 NOTE — Telephone Encounter (Signed)
*  STAT* If patient is at the pharmacy, call can be transferred to refill team.   1. Which medications need to be refilled? (please list name of each medication and dose if known)  valsartan (DIOVAN) 160 MG tablet   2. Which pharmacy/location (including street and city if local pharmacy) is medication to be sent to? [place], [place]   3. Do they need a 30 day or 90 day supply? 90 day

## 2022-07-13 MED ORDER — VALSARTAN 160 MG PO TABS: 160.0000 mg | ORAL_TABLET | Freq: Every day | ORAL | 3 refills | Status: DC

## 2022-07-31 ENCOUNTER — Telehealth: Payer: Self-pay | Admitting: Internal Medicine

## 2022-07-31 NOTE — Telephone Encounter (Signed)
*  STAT* If patient is at the pharmacy, call can be transferred to refill team.   1. Which medications need to be refilled? (please list name of each medication and dose if known) valsartan (DIOVAN) 160 MG tablet   2. Which pharmacy/location (including street and city if local pharmacy) is medication to be sent to? [place], [place]   3. Do they need a 30 day or 90 day supply? 90 day supply  Patient is leaving to go out of town soon and they are wanting pt to receive it prior to.

## 2022-08-01 MED ORDER — VALSARTAN 160 MG PO TABS: 160.0000 mg | ORAL_TABLET | Freq: Every day | ORAL | 3 refills | Status: DC

## 2022-08-14 DIAGNOSIS — J069 Acute upper respiratory infection, unspecified: Secondary | ICD-10-CM | POA: Diagnosis not present

## 2022-08-14 DIAGNOSIS — Z87891 Personal history of nicotine dependence: Secondary | ICD-10-CM | POA: Diagnosis not present

## 2022-08-14 DIAGNOSIS — R7303 Prediabetes: Secondary | ICD-10-CM | POA: Diagnosis not present

## 2022-08-14 DIAGNOSIS — I10 Essential (primary) hypertension: Secondary | ICD-10-CM | POA: Diagnosis not present

## 2022-08-14 DIAGNOSIS — J432 Centrilobular emphysema: Secondary | ICD-10-CM | POA: Diagnosis not present

## 2022-08-14 DIAGNOSIS — E785 Hyperlipidemia, unspecified: Secondary | ICD-10-CM | POA: Diagnosis not present

## 2022-09-05 ENCOUNTER — Other Ambulatory Visit (HOSPITAL_COMMUNITY): Payer: Self-pay

## 2022-10-22 ENCOUNTER — Other Ambulatory Visit: Payer: Self-pay

## 2022-10-22 ENCOUNTER — Other Ambulatory Visit (HOSPITAL_COMMUNITY): Payer: Self-pay

## 2022-10-22 MED ORDER — LEVOTHYROXINE SODIUM 50 MCG PO TABS
50.0000 ug | ORAL_TABLET | Freq: Every day | ORAL | 3 refills | Status: DC
  Filled 2022-10-22: qty 90, 90d supply, fill #0
  Filled 2023-01-09: qty 90, 90d supply, fill #1
  Filled 2023-01-09: qty 90, 90d supply, fill #0
  Filled 2023-04-02: qty 90, 90d supply, fill #1
  Filled 2023-06-27: qty 90, 90d supply, fill #2

## 2022-10-22 MED ORDER — ATORVASTATIN CALCIUM 40 MG PO TABS
40.0000 mg | ORAL_TABLET | Freq: Every day | ORAL | 3 refills | Status: DC
  Filled 2022-10-22: qty 90, 90d supply, fill #0
  Filled 2023-03-08: qty 90, 90d supply, fill #1
  Filled 2023-06-12: qty 90, 90d supply, fill #2

## 2022-10-22 MED ORDER — VALSARTAN 160 MG PO TABS
160.0000 mg | ORAL_TABLET | Freq: Every day | ORAL | 3 refills | Status: DC
  Filled 2022-10-22: qty 90, 90d supply, fill #0

## 2022-11-07 ENCOUNTER — Other Ambulatory Visit (HOSPITAL_COMMUNITY): Payer: Self-pay

## 2022-11-07 DIAGNOSIS — M722 Plantar fascial fibromatosis: Secondary | ICD-10-CM | POA: Diagnosis not present

## 2022-11-08 ENCOUNTER — Other Ambulatory Visit (HOSPITAL_COMMUNITY): Payer: Self-pay

## 2022-11-08 MED ORDER — PREDNISONE 10 MG PO TABS
ORAL_TABLET | ORAL | 0 refills | Status: AC
  Filled 2022-11-08: qty 14, 6d supply, fill #0

## 2022-11-08 MED ORDER — MELOXICAM 7.5 MG PO TABS
7.5000 mg | ORAL_TABLET | Freq: Two times a day (BID) | ORAL | 0 refills | Status: DC
  Filled 2022-11-08: qty 60, 30d supply, fill #0

## 2022-11-09 ENCOUNTER — Other Ambulatory Visit (HOSPITAL_COMMUNITY): Payer: Self-pay

## 2022-11-10 ENCOUNTER — Other Ambulatory Visit (HOSPITAL_COMMUNITY): Payer: Self-pay

## 2022-12-04 ENCOUNTER — Other Ambulatory Visit (HOSPITAL_COMMUNITY): Payer: Self-pay

## 2022-12-06 ENCOUNTER — Other Ambulatory Visit (HOSPITAL_COMMUNITY): Payer: Self-pay

## 2022-12-06 MED ORDER — OMEPRAZOLE 40 MG PO CPDR
40.0000 mg | DELAYED_RELEASE_CAPSULE | Freq: Every day | ORAL | 0 refills | Status: DC
  Filled 2022-12-06: qty 90, 90d supply, fill #0

## 2022-12-07 ENCOUNTER — Other Ambulatory Visit: Payer: Self-pay

## 2022-12-11 ENCOUNTER — Other Ambulatory Visit (HOSPITAL_COMMUNITY): Payer: Self-pay

## 2022-12-11 DIAGNOSIS — I10 Essential (primary) hypertension: Secondary | ICD-10-CM | POA: Diagnosis not present

## 2022-12-11 MED ORDER — VALSARTAN 320 MG PO TABS
320.0000 mg | ORAL_TABLET | Freq: Every day | ORAL | 3 refills | Status: DC
  Filled 2022-12-11: qty 90, 90d supply, fill #0
  Filled 2023-03-08: qty 90, 90d supply, fill #1
  Filled 2023-06-04: qty 90, 90d supply, fill #2
  Filled 2023-09-24: qty 90, 90d supply, fill #3

## 2022-12-11 MED ORDER — AMLODIPINE BESYLATE 5 MG PO TABS
5.0000 mg | ORAL_TABLET | Freq: Every day | ORAL | 5 refills | Status: DC
  Filled 2022-12-11: qty 30, 30d supply, fill #0

## 2022-12-19 DIAGNOSIS — M722 Plantar fascial fibromatosis: Secondary | ICD-10-CM | POA: Diagnosis not present

## 2022-12-25 DIAGNOSIS — I10 Essential (primary) hypertension: Secondary | ICD-10-CM | POA: Diagnosis not present

## 2022-12-26 DIAGNOSIS — M722 Plantar fascial fibromatosis: Secondary | ICD-10-CM | POA: Diagnosis not present

## 2023-01-04 ENCOUNTER — Other Ambulatory Visit: Payer: Self-pay

## 2023-01-04 ENCOUNTER — Other Ambulatory Visit (HOSPITAL_COMMUNITY): Payer: Self-pay

## 2023-01-04 MED ORDER — AMLODIPINE BESYLATE 5 MG PO TABS
5.0000 mg | ORAL_TABLET | Freq: Every day | ORAL | 1 refills | Status: DC
  Filled 2023-01-04: qty 30, 30d supply, fill #0

## 2023-01-09 ENCOUNTER — Other Ambulatory Visit (HOSPITAL_COMMUNITY): Payer: Self-pay

## 2023-01-09 ENCOUNTER — Other Ambulatory Visit: Payer: Self-pay

## 2023-01-30 ENCOUNTER — Other Ambulatory Visit: Payer: Self-pay | Admitting: Internal Medicine

## 2023-01-30 DIAGNOSIS — Z1231 Encounter for screening mammogram for malignant neoplasm of breast: Secondary | ICD-10-CM

## 2023-02-05 DIAGNOSIS — M79671 Pain in right foot: Secondary | ICD-10-CM | POA: Diagnosis not present

## 2023-02-06 DIAGNOSIS — E785 Hyperlipidemia, unspecified: Secondary | ICD-10-CM | POA: Diagnosis not present

## 2023-02-06 DIAGNOSIS — R7989 Other specified abnormal findings of blood chemistry: Secondary | ICD-10-CM | POA: Diagnosis not present

## 2023-02-06 DIAGNOSIS — R7303 Prediabetes: Secondary | ICD-10-CM | POA: Diagnosis not present

## 2023-02-06 DIAGNOSIS — E039 Hypothyroidism, unspecified: Secondary | ICD-10-CM | POA: Diagnosis not present

## 2023-02-06 DIAGNOSIS — M8580 Other specified disorders of bone density and structure, unspecified site: Secondary | ICD-10-CM | POA: Diagnosis not present

## 2023-02-06 DIAGNOSIS — I10 Essential (primary) hypertension: Secondary | ICD-10-CM | POA: Diagnosis not present

## 2023-02-12 ENCOUNTER — Other Ambulatory Visit: Payer: Self-pay

## 2023-02-12 ENCOUNTER — Other Ambulatory Visit (HOSPITAL_COMMUNITY): Payer: Self-pay

## 2023-02-12 DIAGNOSIS — Z0000 Encounter for general adult medical examination without abnormal findings: Secondary | ICD-10-CM | POA: Diagnosis not present

## 2023-02-12 DIAGNOSIS — Z6833 Body mass index (BMI) 33.0-33.9, adult: Secondary | ICD-10-CM | POA: Diagnosis not present

## 2023-02-12 DIAGNOSIS — E785 Hyperlipidemia, unspecified: Secondary | ICD-10-CM | POA: Diagnosis not present

## 2023-02-12 DIAGNOSIS — R110 Nausea: Secondary | ICD-10-CM | POA: Diagnosis not present

## 2023-02-12 DIAGNOSIS — Z1331 Encounter for screening for depression: Secondary | ICD-10-CM | POA: Diagnosis not present

## 2023-02-12 DIAGNOSIS — E669 Obesity, unspecified: Secondary | ICD-10-CM | POA: Diagnosis not present

## 2023-02-12 DIAGNOSIS — M8580 Other specified disorders of bone density and structure, unspecified site: Secondary | ICD-10-CM | POA: Diagnosis not present

## 2023-02-12 DIAGNOSIS — Z1339 Encounter for screening examination for other mental health and behavioral disorders: Secondary | ICD-10-CM | POA: Diagnosis not present

## 2023-02-12 DIAGNOSIS — Z87891 Personal history of nicotine dependence: Secondary | ICD-10-CM | POA: Diagnosis not present

## 2023-02-12 DIAGNOSIS — R7303 Prediabetes: Secondary | ICD-10-CM | POA: Diagnosis not present

## 2023-02-12 DIAGNOSIS — I700 Atherosclerosis of aorta: Secondary | ICD-10-CM | POA: Diagnosis not present

## 2023-02-12 DIAGNOSIS — Z853 Personal history of malignant neoplasm of breast: Secondary | ICD-10-CM | POA: Diagnosis not present

## 2023-02-12 DIAGNOSIS — I10 Essential (primary) hypertension: Secondary | ICD-10-CM | POA: Diagnosis not present

## 2023-02-12 DIAGNOSIS — E039 Hypothyroidism, unspecified: Secondary | ICD-10-CM | POA: Diagnosis not present

## 2023-02-12 MED ORDER — AMLODIPINE BESYLATE 2.5 MG PO TABS
2.5000 mg | ORAL_TABLET | Freq: Every day | ORAL | 0 refills | Status: DC
  Filled 2023-02-12: qty 30, 30d supply, fill #0

## 2023-02-18 DIAGNOSIS — M722 Plantar fascial fibromatosis: Secondary | ICD-10-CM | POA: Diagnosis not present

## 2023-02-18 DIAGNOSIS — M6701 Short Achilles tendon (acquired), right ankle: Secondary | ICD-10-CM | POA: Diagnosis not present

## 2023-02-19 ENCOUNTER — Other Ambulatory Visit: Payer: Self-pay | Admitting: Internal Medicine

## 2023-02-19 DIAGNOSIS — M8580 Other specified disorders of bone density and structure, unspecified site: Secondary | ICD-10-CM

## 2023-02-20 ENCOUNTER — Other Ambulatory Visit: Payer: Self-pay

## 2023-02-20 ENCOUNTER — Other Ambulatory Visit: Payer: Self-pay | Admitting: Internal Medicine

## 2023-02-20 ENCOUNTER — Other Ambulatory Visit (HOSPITAL_COMMUNITY): Payer: Self-pay

## 2023-02-20 DIAGNOSIS — Z87891 Personal history of nicotine dependence: Secondary | ICD-10-CM

## 2023-02-20 MED ORDER — BUSPIRONE HCL 7.5 MG PO TABS
7.5000 mg | ORAL_TABLET | Freq: Two times a day (BID) | ORAL | 3 refills | Status: DC
  Filled 2023-02-20 (×2): qty 60, 30d supply, fill #0
  Filled 2024-01-29: qty 60, 30d supply, fill #1

## 2023-03-08 ENCOUNTER — Other Ambulatory Visit (HOSPITAL_COMMUNITY): Payer: Self-pay

## 2023-03-09 ENCOUNTER — Other Ambulatory Visit (HOSPITAL_COMMUNITY): Payer: Self-pay

## 2023-03-11 ENCOUNTER — Other Ambulatory Visit (HOSPITAL_COMMUNITY): Payer: Self-pay

## 2023-03-11 ENCOUNTER — Other Ambulatory Visit: Payer: Self-pay

## 2023-03-11 MED ORDER — OMEPRAZOLE 40 MG PO CPDR
40.0000 mg | DELAYED_RELEASE_CAPSULE | Freq: Every day | ORAL | 3 refills | Status: DC
  Filled 2023-03-11 – 2023-03-18 (×2): qty 90, 90d supply, fill #0
  Filled 2023-08-07: qty 90, 90d supply, fill #1
  Filled 2023-10-31: qty 90, 90d supply, fill #2
  Filled 2024-01-29: qty 90, 90d supply, fill #3

## 2023-03-14 ENCOUNTER — Other Ambulatory Visit: Payer: Self-pay

## 2023-03-14 ENCOUNTER — Other Ambulatory Visit (HOSPITAL_COMMUNITY): Payer: Self-pay

## 2023-03-14 MED ORDER — AMLODIPINE BESYLATE 2.5 MG PO TABS
2.5000 mg | ORAL_TABLET | Freq: Every day | ORAL | 1 refills | Status: DC
  Filled 2023-03-14: qty 30, 30d supply, fill #0
  Filled 2023-04-02 – 2023-04-10 (×2): qty 30, 30d supply, fill #1

## 2023-03-18 ENCOUNTER — Ambulatory Visit: Admission: RE | Admit: 2023-03-18 | Payer: HMO | Source: Ambulatory Visit

## 2023-03-18 ENCOUNTER — Other Ambulatory Visit (HOSPITAL_COMMUNITY): Payer: Self-pay

## 2023-03-18 DIAGNOSIS — Z1231 Encounter for screening mammogram for malignant neoplasm of breast: Secondary | ICD-10-CM

## 2023-03-20 ENCOUNTER — Other Ambulatory Visit: Payer: Self-pay

## 2023-03-20 ENCOUNTER — Other Ambulatory Visit (HOSPITAL_COMMUNITY): Payer: Self-pay

## 2023-03-20 ENCOUNTER — Ambulatory Visit
Admission: RE | Admit: 2023-03-20 | Discharge: 2023-03-20 | Disposition: A | Discharge status: Home / Self Care | Payer: HMO | Source: Ambulatory Visit | Attending: Internal Medicine | Admitting: Internal Medicine

## 2023-03-20 DIAGNOSIS — Z87891 Personal history of nicotine dependence: Secondary | ICD-10-CM | POA: Diagnosis not present

## 2023-03-20 MED ORDER — PREDNISOLONE ACETATE 1 % OP SUSP
1.0000 [drp] | Freq: Four times a day (QID) | OPHTHALMIC | 1 refills | Status: DC
  Filled 2023-03-20: qty 5, 14d supply, fill #0

## 2023-04-03 ENCOUNTER — Other Ambulatory Visit: Payer: Self-pay

## 2023-04-10 ENCOUNTER — Other Ambulatory Visit: Payer: Self-pay

## 2023-05-07 ENCOUNTER — Other Ambulatory Visit: Payer: Self-pay

## 2023-05-07 ENCOUNTER — Other Ambulatory Visit (HOSPITAL_COMMUNITY): Payer: Self-pay

## 2023-05-07 MED ORDER — AMLODIPINE BESYLATE 2.5 MG PO TABS
2.5000 mg | ORAL_TABLET | Freq: Every day | ORAL | 3 refills | Status: DC
  Filled 2023-05-07: qty 90, 90d supply, fill #0
  Filled 2023-08-06: qty 90, 90d supply, fill #1

## 2023-06-05 ENCOUNTER — Other Ambulatory Visit (HOSPITAL_COMMUNITY): Payer: Self-pay

## 2023-06-12 ENCOUNTER — Other Ambulatory Visit: Payer: Self-pay

## 2023-06-27 ENCOUNTER — Other Ambulatory Visit: Payer: Self-pay

## 2023-06-28 NOTE — Progress Notes (Unsigned)
Cardiology Clinic Note   Patient Name: [name] Date of Encounter: 07/01/2023  Primary Care Provider:  Melida Quitter, MD Primary Cardiologist:  Parke Poisson, MD  Patient Profile    [name] 72 year old female presents to the clinic today for follow-up evaluation of her mitral valve regurgitation and essential hypertension.  Past Medical History    Past Medical History:  Diagnosis Date   Chronic shoulder bursitis    bilateral--- left > right   GERD (gastroesophageal reflux disease)    History of acute pancreatitis    03-02-2016  w/ sepsis-- resolved   History of cardiac murmur in childhood    History of cellulitis    03-29-2016  chest wall cellulitis w/ possible shingles, resolved   Hypertension    followed by pcp  (07-15-2019 per pt never had stress test)   Hypothyroidism    followed by pcp   Ileitis    dx ED visit in epic 06-07-2019 (07-15-2019  per pt symptoms have resolved)   Malignant neoplasm of upper-outer quadrant of left breast in female, estrogen receptor positive [facility]) oncologist-- dr Pamelia Hoit   dx 04/ 2016,  DCIS left breast, Stage 0, Grade 2,  ER+/ PR negative-- s/p  left lumpectomy 01-04-2015, re-excision 01-12-2015, left mastectomy w/ node dissection 04-07-2015 with reconstruction;  started tamoxifen 09/ 2016   PMB (postmenopausal bleeding)    PONV (postoperative nausea and vomiting)    Wears glasses    Past Surgical History:  Procedure Laterality Date   BREAST BIOPSY Left 12/08/2014; 01/2015   BREAST LUMPECTOMY WITH RADIOACTIVE SEED LOCALIZATION Left 01/04/2015   Procedure: LEFT PARTIAL MASTECTOMY WITH RADIOACTIVE SEED LOCALIZATION;  Surgeon: Claud Kelp, MD;  Location: [facility] OR;  Service: General;  Laterality: Left;   BREAST RECONSTRUCTION WITH PLACEMENT OF TISSUE EXPANDER AND FLEX HD (ACELLULAR HYDRATED DERMIS) Left 04/07/2015   Procedure: IMMEDIATE LEFT BREAST RECONSTRUCTION WITH PLACEMENT SALINE IMPLANT AND CHEST WALL RECONSTRUCTION  WITH  ACELLULAR DERMAL MATRIX ;  Surgeon: Etter Sjogren, MD;  Location: [facility] OR;  Service: Plastics;  Laterality: Left;   CATARACT EXTRACTION W/ INTRAOCULAR LENS  IMPLANT, BILATERAL  2005; 2017   CHOLECYSTECTOMY  09/17/2011   Procedure: LAPAROSCOPIC CHOLECYSTECTOMY WITH INTRAOPERATIVE CHOLANGIOGRAM;  Surgeon: Ernestene Mention, MD;  Location: [facility] OR;  Service: General;  Laterality: N/A;  carm    DILATATION & CURETTAGE/HYSTEROSCOPY WITH MYOSURE N/A 07/20/2019   Procedure: DILATATION & CURETTAGE/HYSTEROSCOPY WITH MYOSURE;  Surgeon: Marlow Baars, MD;  Location: [facility] [place];  Service: Gynecology;  Laterality: N/A;   HYSTEROSCOPY WITH D & C N/A 10/28/2015   Procedure: DILATATION AND CURETTAGE /HYSTEROSCOPY, Myosure;  Surgeon: Ilda Mori, MD;  Location: [facility];  Service: Gynecology;  Laterality: N/A;   LIVER SURGERY     MASTECTOMY Left 2016   NIPPLE SPARING MASTECTOMY/SENTINAL LYMPH NODE BIOPSY/RECONSTRUCTION/PLACEMENT OF TISSUE EXPANDER Left 04/07/2015   Procedure: LEFT NIPPLE SPARING MASTECTOMY WITH LEFT SENTINAL LYMPH NODE BIOPSY AND  RECONSTRUCTION WITH PLACEMENT OF TISSUE EXPANDER;  Surgeon: Claud Kelp, MD;  Location: [facility] OR;  Service: General;  Laterality: Left;   RE-EXCISION OF BREAST LUMPECTOMY Left 01/12/2015   Procedure: LEFT  BREAST LUMPECTOMY RE-EXCISION OF MARGINS;  Surgeon: Claud Kelp, MD;  Location: [facility];  Service: General;  Laterality: Left;    Allergies  Allergies  Allergen Reactions   Latex Itching and Rash   Other Itching and Rash    Patient states that she was unable to use supportive wrap they provided at [facility] following a breast removal and reconstruction.    History of Present Illness    [name] has a PMH of HTN, hypothyroidism, breast cancer in 2016 with mastectomy, pancreatitis secondary to HCTZ, and GERD.  She underwent nuclear stress testing 5/21 which showed an EF of 66% and normal perfusion.  She has a history  of mitral valve regurgitation which was identified on echocardiogram 6/21.  Her EF was 60 to 65%.  She was noted to have intermittent bouts of chest discomfort when seen by Dr. Jacques Navy 03/01/2021.  Dr. Jacques Navy recommended as needed nitroglycerin.  Her echocardiogram 03/01/2022 showed an EF of 50-55%, no regional wall motion abnormalities, and moderate-severe mitral regurgitation.  She was seen by Azalee Course PA-C on 04/01/2022.  During that time she denied any worsening DOE.  She was not very active at home.  She noted chronic intermittent episodes of chest discomfort about once a week.  Her episodes would last for about 1 minute.  This was the same chest discomfort that she noted prior to her stress testing in 2021.  She reported that the frequency and duration of the chest discomfort had improved.  She denied dizziness, lower extremity swelling orthopnea and PND.  Cardiac MRI was discussed.  It was felt that holding off TEE was warranted due to no obvious heart failure type symptoms.  Follow-up was planned for 2 months.  She presented to the clinic 05/28/22 for follow-up evaluation and stated she had been increasing her physical activity.  She was doing resistance training as well as walking 3 times per week.  She reported that she was exceeding 150 minutes/week of physical activity.  She continued to lose weight.  Her blood pressure at home was 111-120 over 70s.  Initially in the clinic  her blood pressure is 140/82 and on recheck was 136/72.  We reviewed her cardiac MRI and she expressed understanding.  I gave her the salty 6 diet sheet, had her continue her current physical activity, continue her current medication regimen and planned follow-up in 9 to 12 months.  She presented to the emergency department on 05/13/2022 with middle ear effusion.  She was prescribed Augmentin.  She presents to the clinic today for follow-up evaluation and states she has been doing therapy type exercises for plantar fasciitis.   Due to the right foot pain she has not been as physically active.  We reviewed her previous echocardiogram and mitral valve regurgitation.  She expressed understanding.  Her EKG today shows sinus bradycardia 57 bpm.  Initially her blood pressure is elevated at 148/78 and on recheck is 134/72.  She has been monitoring her blood pressure at home and reports low 100s over 70s.  She has calibrated her cuff with her primary care doctor.  She has noted 1 episode of vision change and dizziness.  The episode lasted for about 2 to 3 seconds and she has not had a reoccurrence.  I informed her that we may need to order a cardiac event monitor if she has more reoccurrence.  She expressed understanding.  She is also noted some tightness along her left chest area that appears to be muscle related I will have her continue to monitor her blood pressure, ordered BMP, repeat her echocardiogram and plan follow-up in 12 months..  Today she denies chest pain, shortness of breath, lower extremity edema, fatigue, palpitations, melena, hematuria, hemoptysis, diaphoresis, weakness, presyncope, syncope, orthopnea, and PND.  Home Medications    Prior to Admission medications  Medication Sig Start Date End Date Taking? Authorizing Provider  albuterol (VENTOLIN HFA) 108 (90 Base) MCG/ACT inhaler  07/31/21   [provider]  amitriptyline (ELAVIL) 10 MG tablet Take 10 mg by mouth at bedtime as needed.     [provider]  amoxicillin-clavulanate (AUGMENTIN) 875-125 MG tablet Take 1 tablet by mouth every 12 (twelve) hours. 05/13/22   Gustavus Bryant, FNP  Ascorbic Acid (VITAMIN C) 1000 MG tablet Take 1,000 mg by mouth daily.    [provider]  aspirin 81 MG chewable tablet Chew 81 mg by mouth daily.    [provider]  atorvastatin (LIPITOR) 40 MG tablet Take 40 mg by mouth daily. 02/16/20   [provider]  busPIRone (BUSPAR) 7.5 MG tablet  01/24/21   [provider]  Calcium  Carb-Cholecalciferol (CALCIUM + VITAMIN D3 PO) Take 1,200 mg by mouth daily.    [provider]  Cholecalciferol (VITAMIN D3) 50 MCG (2000 UT) TABS Take 1 tablet by mouth daily.    [provider]  dicyclomine (BENTYL) 10 MG capsule Take 1 capsule (10 mg total) by mouth in the morning and at bedtime. 03/15/21   Zehr, Princella Pellegrini, PA-C  glucosamine-chondroitin 500-400 MG tablet Take 2 tablets by mouth daily.    [provider]  levothyroxine (SYNTHROID) 50 MCG tablet Take 1 tablet by mouth daily.    [provider]  metFORMIN (GLUCOPHAGE-XR) 500 MG 24 hr tablet Take 1 tablet by mouth daily with breakfast. 11/16/21   [provider]  mirabegron ER (MYRBETRIQ) 50 MG TB24 tablet     [provider]  Multiple Vitamins-Minerals (CENTRUM SILVER 50+WOMEN PO) Take 1 tablet by mouth daily.    [provider]  Omega-3 Fatty Acids (OMEGA-3 FISH OIL) 300 MG CAPS Take 1 capsule by mouth daily.    [provider]  omeprazole (PRILOSEC) 40 MG capsule Take 40 mg by mouth daily.    [provider]  Potassium Citrate,Elemental K, 99 MG CAPS 1 tablet    [provider]  Probiotic CAPS Take 1 capsule by mouth every morning.    [provider]  traZODone (DESYREL) 50 MG tablet Take 50 mg by mouth at bedtime as needed for sleep.    [provider]  valsartan (DIOVAN) 160 MG tablet Take 1 tablet (160 mg total) by mouth daily. 04/16/22   Azalee Course, PA  VITAMIN E PO Take by mouth daily.    [provider]  zinc gluconate 50 MG tablet Take 50 mg by mouth daily.    [provider]    Family History    Family History  Problem Relation Age of Onset   Heart disease Mother    Hypertension Mother    Cancer Father    Leukemia Father    Heart disease Father    Cancer Brother    Breast cancer Maternal Aunt    Colon cancer Other        Father's aunt   Rectal cancer Neg Hx    Stomach cancer Neg Hx     She indicated that her mother is alive. She indicated that her father is deceased. She indicated that her brother is deceased. She indicated that the status of her maternal aunt is unknown. She indicated that the status of her neg hx is unknown. She indicated that the status of her other is unknown.  Social History    Social History   Socioeconomic History   Marital status:  Divorced    Spouse name: Not on file   Number of children: 1   Years of education: Not on file   Highest education level: Not on file  Occupational History   Occupation: Retired  Tobacco Use   Smoking status: Former    Current packs/day: 0.00    Average packs/day: 0.5 packs/day for 46.0 years (23.0 ttl pk-yrs)    Types: Cigarettes    Start date: 11/24/1968    Quit date: 11/25/2014    Years since quitting: 8.6   Smokeless tobacco: Never  Vaping Use   Vaping status: Never Used  Substance and Sexual Activity   Alcohol use: Yes    Comment: very rare   Drug use: Never   Sexual activity: Not on file  Other Topics Concern   Not on file  Social History Narrative   Not on file   Social Determinants of Health   Financial Resource Strain: Not on file  Food Insecurity: Not on file  Transportation Needs: Not on file  Physical Activity: Not on file  Stress: Not on file  Social Connections: Not on file  Intimate Partner Violence: Not on file     Review of Systems    General:  No chills, fever, night sweats or weight changes.  Cardiovascular:  No chest pain, dyspnea on exertion, edema, orthopnea, palpitations, paroxysmal nocturnal dyspnea. Dermatological: No rash, lesions/masses Respiratory: No cough, dyspnea Urologic: No hematuria, dysuria Abdominal:   No nausea, vomiting, diarrhea, bright red blood per rectum, melena, or hematemesis Neurologic:  No visual changes, wkns, changes in mental status. All other systems reviewed and are otherwise negative except as noted above.  Physical Exam    VS:  BP  134/72   Pulse (!) 57   Ht 5\' 3"  (1.6 m)   Wt 191 lb 9.6 oz (86.9 kg)   SpO2 94%   BMI 33.94 kg/m  , BMI Body mass index is 33.94 kg/m. GEN: Well nourished, well developed, in no acute distress. HEENT: normal. Neck: Supple, no JVD, carotid bruits, or masses. Cardiac: RRR, no murmurs, rubs, or gallops. No clubbing, cyanosis, edema.  Radials/DP/PT 2+ and equal bilaterally.  Respiratory:  Respirations regular and unlabored, clear to auscultation bilaterally. GI: Soft, nontender, nondistended, BS + x 4. MS: no deformity or atrophy. Skin: warm and dry, no rash. Neuro:  Strength and sensation are intact. Psych: Normal affect.  Accessory Clinical Findings    Recent Labs: No results found for requested labs within last 365 days.   Recent Lipid Panel    Component Value Date/Time   CHOL 138 03/02/2016 1040   TRIG 94 03/02/2016 1040   HDL 53 03/02/2016 1040   CHOLHDL 2.6 03/02/2016 1040   VLDL 19 03/02/2016 1040   LDLCALC 66 03/02/2016 1040         ECG personally reviewed by me today- EKG Interpretation Date/Time:  Monday July 01 2023 10:04:38 EDT Ventricular Rate:  57 PR Interval:  138 QRS Duration:  86 QT Interval:  412 QTC Calculation: 401 R Axis:   70  Text Interpretation: Sinus bradycardia with sinus arrhythmia Confirmed by Edd Fabian [phone]) on 07/01/2023 10:05:43 AM   EKG 05/28/2022 normal sinus rhythm with sinus arrhythmia 62 bpm- No acute changes  EKG 03/05/2022  Sinus bradycardia poor R wave progression in anterior leads, pulse 51 bpm  Echocardiogram 03/01/2022  1. Left ventricular ejection fraction, by estimation, is 50 to 55%. The  left ventricle has low normal function. The left ventricle has no regional  wall motion abnormalities. Left ventricular diastolic parameters were  normal. The average left ventricular   global longitudinal strain is -16.9 %. The global longitudinal strain is  normal.   2. Right ventricular systolic function is normal.  The right ventricular  size is normal. There is normal pulmonary artery systolic pressure.   3. The mitral valve is normal in structure. Moderate to severe mitral  valve regurgitation.   4. The aortic valve is tricuspid. Aortic valve regurgitation is not  visualized. No aortic stenosis is present.   Cardiac MRI 05/23/2022  IMPRESSION: 1.  Normal LV size, wall thickness, and systolic function (EF 55%)   2.  Normal RV size and systolic function (EF 52%)   3.  No late gadolinium enhancement to suggest myocardial scar   4.  Mild to moderate mitral regurgitation (regurgitant fraction 19%)     Electronically Signed   By: Epifanio Lesches M.D.   On: 05/23/2022 22:41  Assessment & Plan   1.  Mitral valve regurgitation-stable physical activity.  Denies increased DOE or activity intolerance.    MRI 05/23/2022 showed mild-moderate mitral regurgitation, trivial aortic valve regurgitation, mild tricuspid valve regurgitation, trivial pulmonic valve regurgitation and trivial pericardial effusion.  Her EF was noted to be 55%.  No late gadolinium enhancement. Order echocardiogram.  Essential hypertension-BP today 134/72.  Well-controlled at home. Continue valsartan Heart healthy low-sodium diet-salty 6 reviewed Maintain continue physical activity  Atypical chest pain-  Stable.  This is chronic.  Prior stress testing 2021 showed low risk and normal perfusion.  Today she describes some squeezing type pressure that appears to be musculoskeletal spasms that happen at rest.  She has also noticed them while she is cooking. Ordered BMP Continue to monitor  Disposition: Follow-up with Dr. Jacques Navy or me 9-12 months.  Thomasene Ripple. Mouhamed Glassco NP-C     07/01/2023, 10:32 AM [facility] [address] Office [phone] Fax [phone]  Notice: This dictation was prepared with Dragon dictation along with smaller phrase technology. Any transcriptional errors that  result from this process are unintentional and may not be corrected upon review.  I spent 14 minutes examining this patient, reviewing medications, and using patient centered shared decision making involving her cardiac care.  Prior to her visit I spent greater than 20 minutes reviewing her past medical history,  medications, and prior cardiac tests.

## 2023-07-01 ENCOUNTER — Ambulatory Visit: Payer: HMO | Attending: General Practice | Admitting: General Practice

## 2023-07-01 ENCOUNTER — Encounter: Payer: Self-pay | Admitting: General Practice

## 2023-07-01 VITALS — BP 134/72 | HR 57 | Ht 63.0 in | Wt 191.6 lb

## 2023-07-01 DIAGNOSIS — I340 Nonrheumatic mitral (valve) insufficiency: Secondary | ICD-10-CM

## 2023-07-01 DIAGNOSIS — I10 Essential (primary) hypertension: Secondary | ICD-10-CM | POA: Diagnosis not present

## 2023-07-01 DIAGNOSIS — R0789 Other chest pain: Secondary | ICD-10-CM

## 2023-07-01 NOTE — Patient Instructions (Signed)
Medication Instructions:  The current medical regimen is effective;  continue present plan and medications as directed. Please refer to the Current Medication list given to you today.  *If you need a refill on your cardiac medications before your next appointment, please call your pharmacy*  Lab Work: BMET If you have labs (blood work) drawn today and your tests are completely normal, you will receive your results only by:   MyChart Message (if you have MyChart) OR  A paper copy in the mail If you have any lab test that is abnormal or we need to change your treatment, we will call you to review the results.  Testing/Procedures: Your physician has requested that you have an echocardiogram. Echocardiography is a painless test that uses sound waves to create images of your heart. It provides your doctor with information about the size and shape of your heart and how well your heart's chambers and valves are working. This procedure takes approximately one hour. There are no restrictions for this procedure. Please do NOT wear cologne, perfume, aftershave, or lotions (deodorant is allowed). Please arrive 15 minutes prior to your appointment time.   Other Instructions TAKE AND LOG YOUR BLOOD PRESSURE AT A COUPLE TIMES MONTHLY-CALL WITH QUESTIONS/COMMENTS INCREASE PHYSICAL ACTIVITY-AS TOLERATED  Follow-Up: At [facility], you and your health needs are our priority.  As part of our continuing mission to provide you with exceptional heart care, we have created designated Provider Care Teams.  These Care Teams include your primary Cardiologist (physician) and Advanced Practice Providers (APPs -  Physician Assistants and Nurse Practitioners) who all work together to provide you with the care you need, when you need it.  Your next appointment:   9-12 month(s)  Provider:   Parke Poisson, MD  or Edd Fabian, FNP

## 2023-07-02 LAB — BASIC METABOLIC PANEL
BUN/Creatinine Ratio: 21 (ref 12–28)
BUN: 15 mg/dL (ref 8–27)
CO2: 24 mmol/L (ref 20–29)
Calcium: 9.3 mg/dL (ref 8.7–10.3)
Chloride: 108 mmol/L — ABNORMAL HIGH (ref 96–106)
Creatinine, Ser: 0.7 mg/dL (ref 0.57–1.00)
Glucose: 95 mg/dL (ref 70–99)
Potassium: 3.9 mmol/L (ref 3.5–5.2)
Sodium: 146 mmol/L — ABNORMAL HIGH (ref 134–144)
eGFR: 91 mL/min/{1.73_m2} (ref >59)

## 2023-07-09 DIAGNOSIS — Z853 Personal history of malignant neoplasm of breast: Secondary | ICD-10-CM | POA: Diagnosis not present

## 2023-07-09 DIAGNOSIS — E349 Endocrine disorder, unspecified: Secondary | ICD-10-CM | POA: Diagnosis not present

## 2023-07-09 DIAGNOSIS — Z8262 Family history of osteoporosis: Secondary | ICD-10-CM | POA: Diagnosis not present

## 2023-07-09 DIAGNOSIS — M8588 Other specified disorders of bone density and structure, other site: Secondary | ICD-10-CM | POA: Diagnosis not present

## 2023-07-31 ENCOUNTER — Other Ambulatory Visit (HOSPITAL_COMMUNITY): Payer: HMO

## 2023-08-06 ENCOUNTER — Other Ambulatory Visit (HOSPITAL_COMMUNITY): Payer: Self-pay

## 2023-08-07 ENCOUNTER — Other Ambulatory Visit (HOSPITAL_COMMUNITY): Payer: Self-pay

## 2023-08-13 ENCOUNTER — Other Ambulatory Visit: Payer: Self-pay

## 2023-08-13 ENCOUNTER — Other Ambulatory Visit (HOSPITAL_COMMUNITY): Payer: Self-pay

## 2023-08-13 DIAGNOSIS — R911 Solitary pulmonary nodule: Secondary | ICD-10-CM | POA: Diagnosis not present

## 2023-08-13 DIAGNOSIS — R350 Frequency of micturition: Secondary | ICD-10-CM | POA: Diagnosis not present

## 2023-08-13 DIAGNOSIS — E785 Hyperlipidemia, unspecified: Secondary | ICD-10-CM | POA: Diagnosis not present

## 2023-08-13 DIAGNOSIS — I700 Atherosclerosis of aorta: Secondary | ICD-10-CM | POA: Diagnosis not present

## 2023-08-13 DIAGNOSIS — E669 Obesity, unspecified: Secondary | ICD-10-CM | POA: Diagnosis not present

## 2023-08-13 DIAGNOSIS — Z6833 Body mass index (BMI) 33.0-33.9, adult: Secondary | ICD-10-CM | POA: Diagnosis not present

## 2023-08-13 DIAGNOSIS — I10 Essential (primary) hypertension: Secondary | ICD-10-CM | POA: Diagnosis not present

## 2023-08-13 DIAGNOSIS — Z87891 Personal history of nicotine dependence: Secondary | ICD-10-CM | POA: Diagnosis not present

## 2023-08-13 DIAGNOSIS — Z713 Dietary counseling and surveillance: Secondary | ICD-10-CM | POA: Diagnosis not present

## 2023-08-13 DIAGNOSIS — M8580 Other specified disorders of bone density and structure, unspecified site: Secondary | ICD-10-CM | POA: Diagnosis not present

## 2023-08-13 DIAGNOSIS — R7303 Prediabetes: Secondary | ICD-10-CM | POA: Diagnosis not present

## 2023-08-13 DIAGNOSIS — J432 Centrilobular emphysema: Secondary | ICD-10-CM | POA: Diagnosis not present

## 2023-08-13 MED ORDER — SPIRONOLACTONE 25 MG PO TABS
25.0000 mg | ORAL_TABLET | Freq: Every day | ORAL | 1 refills | Status: AC
  Filled 2023-08-13: qty 90, 90d supply, fill #0
  Filled 2023-11-06: qty 90, 90d supply, fill #1

## 2023-08-14 ENCOUNTER — Ambulatory Visit (HOSPITAL_COMMUNITY): Payer: HMO | Attending: Cardiology

## 2023-08-14 DIAGNOSIS — I340 Nonrheumatic mitral (valve) insufficiency: Secondary | ICD-10-CM | POA: Insufficient documentation

## 2023-08-14 LAB — ECHOCARDIOGRAM COMPLETE
AR max vel: 1.55 cm2
AV Area VTI: 1.56 cm2
AV Area mean vel: 1.48 cm2
AV Mean grad: 7.7 mm[Hg]
AV Peak grad: 14.2 mm[Hg]
Ao pk vel: 1.89 m/s
Area-P 1/2: 3.83 cm2
S' Lateral: 2.7 cm

## 2023-09-05 ENCOUNTER — Other Ambulatory Visit: Payer: Self-pay | Admitting: Internal Medicine

## 2023-09-05 DIAGNOSIS — R911 Solitary pulmonary nodule: Secondary | ICD-10-CM

## 2023-09-06 ENCOUNTER — Other Ambulatory Visit (HOSPITAL_COMMUNITY): Payer: Self-pay

## 2023-09-06 ENCOUNTER — Other Ambulatory Visit: Payer: Self-pay

## 2023-09-06 DIAGNOSIS — J432 Centrilobular emphysema: Secondary | ICD-10-CM | POA: Diagnosis not present

## 2023-09-06 DIAGNOSIS — I700 Atherosclerosis of aorta: Secondary | ICD-10-CM | POA: Diagnosis not present

## 2023-09-06 DIAGNOSIS — Z87891 Personal history of nicotine dependence: Secondary | ICD-10-CM | POA: Diagnosis not present

## 2023-09-06 DIAGNOSIS — E039 Hypothyroidism, unspecified: Secondary | ICD-10-CM | POA: Diagnosis not present

## 2023-09-06 DIAGNOSIS — I10 Essential (primary) hypertension: Secondary | ICD-10-CM | POA: Diagnosis not present

## 2023-09-06 MED ORDER — LEVOTHYROXINE SODIUM 50 MCG PO TABS
50.0000 ug | ORAL_TABLET | Freq: Every morning | ORAL | 3 refills | Status: AC
  Filled 2023-09-06: qty 105, 90d supply, fill #0
  Filled 2023-11-30: qty 105, 90d supply, fill #1
  Filled 2024-02-25: qty 105, 90d supply, fill #2
  Filled 2024-07-21: qty 105, 90d supply, fill #3

## 2023-09-24 ENCOUNTER — Other Ambulatory Visit (HOSPITAL_COMMUNITY): Payer: Self-pay

## 2023-09-24 ENCOUNTER — Ambulatory Visit
Admission: RE | Admit: 2023-09-24 | Discharge: 2023-09-24 | Disposition: A | Discharge status: Home / Self Care | Payer: HMO | Source: Ambulatory Visit | Attending: Internal Medicine | Admitting: Internal Medicine

## 2023-09-24 DIAGNOSIS — R911 Solitary pulmonary nodule: Secondary | ICD-10-CM

## 2023-09-24 DIAGNOSIS — Z87891 Personal history of nicotine dependence: Secondary | ICD-10-CM | POA: Diagnosis not present

## 2023-09-25 ENCOUNTER — Other Ambulatory Visit (HOSPITAL_COMMUNITY): Payer: Self-pay

## 2023-10-01 ENCOUNTER — Other Ambulatory Visit: Payer: HMO

## 2023-10-08 ENCOUNTER — Other Ambulatory Visit (HOSPITAL_COMMUNITY): Payer: Self-pay

## 2023-10-08 MED ORDER — VALSARTAN 320 MG PO TABS
320.0000 mg | ORAL_TABLET | Freq: Every day | ORAL | 3 refills | Status: DC
  Filled 2023-12-18: qty 90, 90d supply, fill #0

## 2023-10-31 ENCOUNTER — Other Ambulatory Visit: Payer: Self-pay

## 2023-11-05 ENCOUNTER — Other Ambulatory Visit (HOSPITAL_COMMUNITY): Payer: Self-pay

## 2023-11-05 MED ORDER — ATORVASTATIN CALCIUM 40 MG PO TABS
40.0000 mg | ORAL_TABLET | Freq: Every evening | ORAL | 3 refills | Status: AC
  Filled 2023-11-05: qty 90, 90d supply, fill #0
  Filled 2024-08-29: qty 90, 90d supply, fill #1

## 2023-11-07 ENCOUNTER — Other Ambulatory Visit (HOSPITAL_COMMUNITY): Payer: Self-pay

## 2023-11-11 DIAGNOSIS — Z01419 Encounter for gynecological examination (general) (routine) without abnormal findings: Secondary | ICD-10-CM | POA: Diagnosis not present

## 2023-11-30 ENCOUNTER — Other Ambulatory Visit (HOSPITAL_COMMUNITY): Payer: Self-pay

## 2023-12-18 ENCOUNTER — Other Ambulatory Visit (HOSPITAL_COMMUNITY): Payer: Self-pay

## 2023-12-18 ENCOUNTER — Other Ambulatory Visit: Payer: Self-pay

## 2023-12-26 ENCOUNTER — Telehealth: Payer: Self-pay

## 2023-12-26 NOTE — Telephone Encounter (Signed)
 error

## 2024-01-29 ENCOUNTER — Encounter (HOSPITAL_COMMUNITY): Payer: Self-pay

## 2024-01-29 ENCOUNTER — Other Ambulatory Visit: Payer: Self-pay

## 2024-01-29 ENCOUNTER — Other Ambulatory Visit (HOSPITAL_COMMUNITY): Payer: Self-pay

## 2024-02-03 ENCOUNTER — Encounter: Payer: Self-pay | Admitting: Internal Medicine

## 2024-02-03 ENCOUNTER — Other Ambulatory Visit: Payer: Self-pay | Admitting: Internal Medicine

## 2024-02-03 DIAGNOSIS — Z1231 Encounter for screening mammogram for malignant neoplasm of breast: Secondary | ICD-10-CM

## 2024-02-18 NOTE — Telephone Encounter (Signed)
 Spoke with pt regarding her blood pressures. Pt reported that she has not taken her blood pressure medications in 3 days (spironolactone  25 mg daily and valsartan  320 mg daily) due to feeling very fatigued and dizzy at times. Pt provided blood pressure readings: 6-11 101/63 hr 67 6-15 86/53  hr 79 @11am  6-15 87/53 hr 58 @ 6pm 6-16 87/53  hr 60 @11am  6-16 91/59  hr 56 @ 5:15pm 6-16 96/53 hr57 @ 5:45pm 6-17 110/62 hr 56 @ 8:30am Pt stated she feels better since stopping her medications. Pt stated she is not currently dizzy or sob but has experienced these symptoms with activity such as walking short distances. Pt also reports she has been sleeping a lot. Pt stated she has been trying to eat salty snacks throughout the day and drinks 70-80 oz of water per day. Pt was told that if she begins to have sob at rest or chest pain, she would need to go to the ED. Pt was told the information provided would be sent to Dr. Chancy Comber and her nurse for their suggestions. Pt verbalized understanding. All questions if any were answered.

## 2024-02-19 DIAGNOSIS — E039 Hypothyroidism, unspecified: Secondary | ICD-10-CM | POA: Diagnosis not present

## 2024-02-19 DIAGNOSIS — M8580 Other specified disorders of bone density and structure, unspecified site: Secondary | ICD-10-CM | POA: Diagnosis not present

## 2024-02-19 DIAGNOSIS — E7849 Other hyperlipidemia: Secondary | ICD-10-CM | POA: Diagnosis not present

## 2024-02-19 DIAGNOSIS — I10 Essential (primary) hypertension: Secondary | ICD-10-CM | POA: Diagnosis not present

## 2024-02-19 DIAGNOSIS — R7303 Prediabetes: Secondary | ICD-10-CM | POA: Diagnosis not present

## 2024-02-19 DIAGNOSIS — E785 Hyperlipidemia, unspecified: Secondary | ICD-10-CM | POA: Diagnosis not present

## 2024-02-20 NOTE — Telephone Encounter (Signed)
 Called and spoke to pt. Offered her TWO different appointments with two different APP's. Pt not able to come in due to other commitments. BP on 02/19/24 was 112/67; HR=60. BP on 02/20/24 was 116/67, HR=59. Dizziness started 02/12/24, then stopped 2-3 days, and started again 02/16/24 and 02/17/24.   On 02/01/2024 she took one BP pill. Then on 02/12/2024 she took one BP pill, then stopped all BP meds after that.   She is out of town the first week of July. Placed on Wait List for opening. Pt stated, If I have to, then I will go to the Emergency Room. Advised pt to also reach out to her PCP regarding the dizziness as she stated that she felt it may be related to her sinuses (took something for sinuses and saw some improvement). She states her PCP is on vacation for one week.

## 2024-02-25 ENCOUNTER — Other Ambulatory Visit: Payer: Self-pay

## 2024-02-28 ENCOUNTER — Other Ambulatory Visit (HOSPITAL_COMMUNITY): Payer: Self-pay

## 2024-03-18 ENCOUNTER — Ambulatory Visit

## 2024-03-19 ENCOUNTER — Other Ambulatory Visit: Payer: Self-pay

## 2024-03-19 ENCOUNTER — Other Ambulatory Visit (HOSPITAL_COMMUNITY): Payer: Self-pay

## 2024-03-19 DIAGNOSIS — E785 Hyperlipidemia, unspecified: Secondary | ICD-10-CM | POA: Diagnosis not present

## 2024-03-19 DIAGNOSIS — Z1331 Encounter for screening for depression: Secondary | ICD-10-CM | POA: Diagnosis not present

## 2024-03-19 DIAGNOSIS — Z0000 Encounter for general adult medical examination without abnormal findings: Secondary | ICD-10-CM | POA: Diagnosis not present

## 2024-03-19 DIAGNOSIS — Z853 Personal history of malignant neoplasm of breast: Secondary | ICD-10-CM | POA: Diagnosis not present

## 2024-03-19 DIAGNOSIS — Z87891 Personal history of nicotine dependence: Secondary | ICD-10-CM | POA: Diagnosis not present

## 2024-03-19 DIAGNOSIS — I700 Atherosclerosis of aorta: Secondary | ICD-10-CM | POA: Diagnosis not present

## 2024-03-19 DIAGNOSIS — E669 Obesity, unspecified: Secondary | ICD-10-CM | POA: Diagnosis not present

## 2024-03-19 DIAGNOSIS — Z6832 Body mass index (BMI) 32.0-32.9, adult: Secondary | ICD-10-CM | POA: Diagnosis not present

## 2024-03-19 DIAGNOSIS — Z1339 Encounter for screening examination for other mental health and behavioral disorders: Secondary | ICD-10-CM | POA: Diagnosis not present

## 2024-03-19 DIAGNOSIS — R7303 Prediabetes: Secondary | ICD-10-CM | POA: Diagnosis not present

## 2024-03-19 DIAGNOSIS — M8580 Other specified disorders of bone density and structure, unspecified site: Secondary | ICD-10-CM | POA: Diagnosis not present

## 2024-03-19 DIAGNOSIS — G4700 Insomnia, unspecified: Secondary | ICD-10-CM | POA: Diagnosis not present

## 2024-03-19 DIAGNOSIS — I10 Essential (primary) hypertension: Secondary | ICD-10-CM | POA: Diagnosis not present

## 2024-03-19 DIAGNOSIS — J432 Centrilobular emphysema: Secondary | ICD-10-CM | POA: Diagnosis not present

## 2024-03-19 MED ORDER — TRAZODONE HCL 50 MG PO TABS
50.0000 mg | ORAL_TABLET | Freq: Every evening | ORAL | 3 refills | Status: AC | PRN
  Filled 2024-03-19: qty 90, 90d supply, fill #0
  Filled 2024-08-29: qty 90, 90d supply, fill #1

## 2024-03-20 ENCOUNTER — Ambulatory Visit
Admission: RE | Admit: 2024-03-20 | Discharge: 2024-03-20 | Disposition: A | Discharge status: Home / Self Care | Source: Ambulatory Visit | Attending: Internal Medicine | Admitting: Internal Medicine

## 2024-03-20 DIAGNOSIS — Z1231 Encounter for screening mammogram for malignant neoplasm of breast: Secondary | ICD-10-CM

## 2024-05-14 ENCOUNTER — Ambulatory Visit: Attending: Internal Medicine | Admitting: Internal Medicine

## 2024-05-14 ENCOUNTER — Encounter: Payer: Self-pay | Admitting: Internal Medicine

## 2024-05-14 VITALS — BP 160/70 | HR 59 | Ht 63.0 in | Wt 181.7 lb

## 2024-05-14 DIAGNOSIS — R079 Chest pain, unspecified: Secondary | ICD-10-CM | POA: Diagnosis not present

## 2024-05-14 DIAGNOSIS — R0789 Other chest pain: Secondary | ICD-10-CM

## 2024-05-14 DIAGNOSIS — I340 Nonrheumatic mitral (valve) insufficiency: Secondary | ICD-10-CM

## 2024-05-14 DIAGNOSIS — Z79899 Other long term (current) drug therapy: Secondary | ICD-10-CM | POA: Diagnosis not present

## 2024-05-14 DIAGNOSIS — I10 Essential (primary) hypertension: Secondary | ICD-10-CM | POA: Diagnosis not present

## 2024-05-14 MED ORDER — VALSARTAN 320 MG PO TABS: 160.0000 mg | ORAL_TABLET | Freq: Every day | ORAL | Status: AC

## 2024-05-14 NOTE — Progress Notes (Signed)
 Cardiology Office Note:  .   Date:  05/14/2024  ID:  [name], DOB Dec 31, 1950, MRN [id number] PCP: Stephane Leita DEL, MD  [facility] Cardiologist:  Soyla DELENA Merck, MD    History of Present Illness: [name] is a 73 y.o. female.  Discussed the use of AI scribe software for clinical note transcription with the patient, who gave verbal consent to proceed.  History of Present Illness [name] is a 73 year old female with mitral valve regurgitation and hypertension who presents with dizziness and fluctuating blood pressure.  Since June, she experiences episodes of dizziness and low energy, correlating with low blood pressure readings. Blood pressure ranges from 89/90 mmHg to 150 mmHg, with dizziness and lack of energy occurring at lower readings. No symptoms are present when blood pressure is high.  Mitral valve regurgitation is confirmed by a 2023 MRI, showing mild to moderate regurgitation with an ejection fraction of 55%. A 2021 ischemic evaluation was normal.  Hypertension was managed with valsartan  320 mg, amlodipine  2.5 mg, and spironolactone  25 mg, but these were discontinued due to low blood pressure and symptoms. Amlodipine  caused swelling at higher doses. Recently, she took one 320 mg dose of valsartan  for increased blood pressure.  She has lost approximately 10 pounds since her last visit, attributing this to lifestyle changes, which may have contributed to decreased blood pressure. No recent illness, cough, cold, flu, or COVID-19.    ROS: negative except per HPI above.  Studies Reviewed: SABRA   EKG Interpretation Date/Time:  Thursday May 14 2024 14:29:38 EDT Ventricular Rate:  59 PR Interval:  140 QRS Duration:  80 QT Interval:  422 QTC Calculation: 417 R Axis:   4  Text Interpretation: Sinus bradycardia Low voltage QRS Confirmed by Kallyn Demarcus ([id number]) on 05/14/2024 3:03:55 PM    Results LABS Cholesterol: Within normal  limits (09/2023) HbA1c: Within normal limits (09/2023) Kidney function: Normal (09/2023) Sodium: Within normal limits (09/2023) Potassium: Within normal limits (09/2023) BUN: Normal (09/2023) Creatinine: Normal (09/2023)  RADIOLOGY Mitral valve MRI: Mild to moderate regurgitation, EF 55%, no late gadolinium enhancement (2023)  DIAGNOSTIC Ischemic evaluation: Normal (2021) EKG: Normal (05/14/2024) Risk Assessment/Calculations:       Physical Exam:   VS:  BP (!) 160/70 (BP Location: Right Arm, Patient Position: Sitting, Cuff Size: Normal)   Pulse (!) 59   Ht 5' 3 (1.6 m)   Wt 181 lb 11.2 oz (82.4 kg)   SpO2 98%   BMI 32.19 kg/m    Wt Readings from Last 3 Encounters:  05/14/24 181 lb 11.2 oz (82.4 kg)  07/01/23 191 lb 9.6 oz (86.9 kg)  05/28/22 176 lb (79.8 kg)     Physical Exam MEASUREMENTS: Weight- 180. GENERAL: Alert, cooperative, well developed, no acute distress. HEENT: Normocephalic, normal oropharynx, moist mucous membranes. CHEST: Clear to auscultation bilaterally, no wheezes, rhonchi, or crackles. CARDIOVASCULAR: Normal heart rate and rhythm, S1 and S2 normal, minimal murmur. ABDOMEN: Soft, non-tender, non-distended, without organomegaly, normal bowel sounds. EXTREMITIES: No cyanosis or edema. NEUROLOGICAL: Cranial nerves grossly intact, moves all extremities without gross motor or sensory deficit.   ASSESSMENT AND PLAN: .    Assessment and Plan Assessment & Plan Chest pain under evaluation Intermittent chest pain with increased severity. Previous normal ischemic evaluation. CT angiogram preferred for detailed anatomical information. No history of contrast allergy. - Order coronary CT angiogram. - Advise monitoring blood pressure and adjust valsartan  dose as needed prior to  CT angiogram.  Mitral valve regurgitation Mild to moderate regurgitation noted on MRI. No significant changes on recent echocardiogram. No symptoms attributed to  regurgitation.  Essential hypertension Previously well-controlled on valsartan . Recent dizziness and hypotension possibly due to weight loss. Blood pressure increasing. Target range 120-140/70-80 mmHg. Restart valsartan  at 160 mg daily. Discussed importance of maintaining target range to prevent complications. - Restart valsartan  160 mg daily. - Monitor blood pressure regularly. - Adjust valsartan  dose based on blood pressure readings.      Soyla Merck, MD, FACC

## 2024-05-14 NOTE — Patient Instructions (Addendum)
 Medication Instructions:    Decrease Valsartan  to 160 mg  ( 1/2 tablet of 320 mg) ,once current bottle is complete please call the office to change prescription to 160 mg tablet daily    *If you need a refill on your cardiac medications before your next appointment, please call your pharmacy*   Lab Work:  Not needed    Testing/Procedures: Your physician has requested that you have coronary  CTA. Coronary computed tomography (CT)angiogram  is a special type of CT scan that uses a computer to produce multi-dimensional views of major blood vessels throughout the heart.  CT angiography, a contrast material is injected through an IV to help visualize the blood vessels  a painless test that uses an x-ray machine to take clear, detailed pictures of your heart arteries .  Please follow instruction sheet as given.    Follow-Up: At [facility], you and your health needs are our priority.  As part of our continuing mission to provide you with exceptional heart care, we have created designated Provider Care Teams.  These Care Teams include your primary Cardiologist (physician) and Advanced Practice Providers (APPs -  Physician Assistants and Nurse Practitioners) who all work together to provide you with the care you need, when you need it.     Your next appointment:   3 month(s)  The format for your next appointment:   In Person  Provider:   Gayatri A Acharya, MD   Other Instructions     Your cardiac CT will be scheduled at one of the below locations:       [facility] [address]  [place], [place] [address] [phone]  If scheduled at the [facility] at [facility] [address], please enter the parking lot using the [address] entrance and use the FREE valet service at the patient drop-off area. Enter the building and check-in with registration on the main floor.  Please follow these instructions carefully (unless otherwise  directed):  An IV will be required for this test and Nitroglycerin will be given.    On the Night Before the Test: Be sure to Drink plenty of water. Do not consume any caffeinated/decaffeinated beverages or chocolate 12 hours prior to your test. Do not take any antihistamines 12 hours prior to your test.    On the Day of the Test: Drink plenty of water until 1 hour prior to the test. Do not eat any food 1 hour prior to test. You may take your regular medications prior to the test.  Take metoprolol not needed prior to test. If you take Furosemide /Hydrochlorothiazide /Spironolactone /Chlorthalidone, please HOLD on the morning of the test FEMALES- please wear underwire-free bra if available, avoid dresses & tight clothing         After the Test: Drink plenty of water. After receiving IV contrast, you may experience a mild flushed feeling. This is normal. On occasion, you may experience a mild rash up to 24 hours after the test. This is not dangerous. If this occurs, you can take Benadryl 25 mg, Zyrtec, Claritin, or Allegra and increase your fluid intake. (Patients taking Tikosyn should avoid Benadryl, and may take Zyrtec, Claritin, or Allegra) If you experience trouble breathing, this can be serious. If it is severe call 911 IMMEDIATELY. If it is mild, please call our office.  We will call to schedule your test 2-4 weeks out understanding that some insurance companies will need an authorization prior to the service being performed.  For more information and frequently asked questions, please visit our website : [email or web address]  For non-scheduling related questions, please contact the cardiac imaging nurse navigator should you have any questions/concerns: Cardiac Imaging Nurse Navigators Direct Office Dial: [phone]   For scheduling needs, including cancellations and rescheduling, please call [place], [phone].

## 2024-05-18 ENCOUNTER — Encounter (HOSPITAL_COMMUNITY): Payer: Self-pay

## 2024-05-20 ENCOUNTER — Ambulatory Visit (HOSPITAL_COMMUNITY)
Admission: RE | Admit: 2024-05-20 | Discharge: 2024-05-20 | Disposition: A | Discharge status: Home / Self Care | Source: Ambulatory Visit | Attending: Internal Medicine | Admitting: Internal Medicine

## 2024-05-20 DIAGNOSIS — I340 Nonrheumatic mitral (valve) insufficiency: Secondary | ICD-10-CM | POA: Insufficient documentation

## 2024-05-20 DIAGNOSIS — I700 Atherosclerosis of aorta: Secondary | ICD-10-CM | POA: Diagnosis not present

## 2024-05-20 DIAGNOSIS — I10 Essential (primary) hypertension: Secondary | ICD-10-CM | POA: Diagnosis not present

## 2024-05-20 DIAGNOSIS — R0789 Other chest pain: Secondary | ICD-10-CM | POA: Diagnosis not present

## 2024-05-20 MED ORDER — IOHEXOL 350 MG/ML SOLN
100.0000 mL | Freq: Once | INTRAVENOUS | Status: AC | PRN
  Administered 2024-05-20: 100 mL via INTRAVENOUS

## 2024-05-20 MED ORDER — NITROGLYCERIN 0.4 MG SL SUBL
0.8000 mg | SUBLINGUAL_TABLET | Freq: Once | SUBLINGUAL | Status: AC
  Administered 2024-05-20: 0.8 mg via SUBLINGUAL

## 2024-05-22 ENCOUNTER — Ambulatory Visit: Payer: Self-pay | Admitting: Internal Medicine

## 2024-05-26 ENCOUNTER — Other Ambulatory Visit (HOSPITAL_COMMUNITY): Payer: Self-pay

## 2024-05-26 ENCOUNTER — Other Ambulatory Visit: Payer: Self-pay

## 2024-05-26 MED ORDER — VALSARTAN 320 MG PO TABS
320.0000 mg | ORAL_TABLET | Freq: Every day | ORAL | 2 refills | Status: AC
  Filled 2024-05-26: qty 100, 100d supply, fill #0
  Filled 2024-10-02: qty 100, 100d supply, fill #1

## 2024-07-21 ENCOUNTER — Other Ambulatory Visit: Payer: Self-pay

## 2024-07-21 ENCOUNTER — Other Ambulatory Visit (HOSPITAL_COMMUNITY): Payer: Self-pay

## 2024-07-21 MED ORDER — OMEPRAZOLE 40 MG PO CPDR
40.0000 mg | DELAYED_RELEASE_CAPSULE | Freq: Every morning | ORAL | 0 refills | Status: AC
  Filled 2024-07-21: qty 90, 90d supply, fill #0

## 2024-08-11 ENCOUNTER — Other Ambulatory Visit (HOSPITAL_COMMUNITY): Payer: Self-pay

## 2024-08-11 MED ORDER — BUSPIRONE HCL 7.5 MG PO TABS
7.5000 mg | ORAL_TABLET | Freq: Two times a day (BID) | ORAL | 0 refills | Status: AC
  Filled 2024-08-11: qty 180, 90d supply, fill #0

## 2024-08-13 ENCOUNTER — Ambulatory Visit: Attending: Internal Medicine | Admitting: Internal Medicine

## 2024-08-13 ENCOUNTER — Encounter: Payer: Self-pay | Admitting: Internal Medicine

## 2024-08-13 VITALS — BP 182/82 | HR 62 | Ht 63.0 in | Wt 186.8 lb

## 2024-08-13 DIAGNOSIS — I340 Nonrheumatic mitral (valve) insufficiency: Secondary | ICD-10-CM

## 2024-08-13 DIAGNOSIS — I10 Essential (primary) hypertension: Secondary | ICD-10-CM

## 2024-08-13 DIAGNOSIS — Z79899 Other long term (current) drug therapy: Secondary | ICD-10-CM | POA: Diagnosis not present

## 2024-08-13 DIAGNOSIS — R079 Chest pain, unspecified: Secondary | ICD-10-CM | POA: Diagnosis not present

## 2024-08-13 NOTE — Patient Instructions (Signed)
 Medication Instructions:  No Changes *If you need a refill on your cardiac medications before your next appointment, please call your pharmacy*  Lab Work: None  Follow-Up: At [facility], you and your health needs are our priority.  As part of our continuing mission to provide you with exceptional heart care, our providers are all part of one team.  This team includes your primary Cardiologist (physician) and Advanced Practice Providers or APPs (Physician Assistants and Nurse Practitioners) who all work together to provide you with the care you need, when you need it.  Your next appointment:   3 month(s) (due around 11/11/2024)  Provider:   Gayatri A Acharya, MD or Orren Fabry, PA-C, Dayna Dunn, PA-C, Callie Goodrich, PA-C, Hao Meng, PA-C, or Glendia Ferrier, PA-C          Other Instructions Please come in around 11/11/24 for a Blood Pressure Check; ;you can either see Loni MD or an APP (Advanced Practice Provider aka Physician's Assistant or Nurse Practitioner) that works closely with Dr. Loni

## 2024-08-13 NOTE — Progress Notes (Signed)
 Cardiology Office Note:  .   Date:  08/13/2024  ID:  SANIA NOY, DOB 1951-02-13, MRN [id number] PCP: Stephane Leita DEL, MD  [facility] Providers Cardiologist:  Soyla DELENA Merck, MD    History of Present Illness: [name] is a 73 y.o. female.  Discussed the use of AI scribe software for clinical note transcription with the patient, who gave verbal consent to proceed.  History of Present Illness [name] is a 73 year old female with mitral valve regurgitation and hypertension who presents with dizziness and fluctuating blood pressure.  She has had dizziness and fluctuating blood pressure for the past few days. Prior ischemic evaluation was normal, and cardiac MRI showed mild to moderate mitral regurgitation with an ejection fraction of 55%.  Her hypertension is treated with valsartan  160 mg daily and spironolactone  25 mg as needed. She increased valsartan  to 320 mg about a week ago for elevated readings. She took spironolactone  25 mg two days ago when her blood pressure was high and she felt dizzy. Home readings have ranged from 155/96 mmHg yesterday to 133/85 mmHg this morning after the higher valsartan  dose. She has noticed dizziness over the past 2 to 3 days, mainly when her blood pressure is higher.  She has gained weight recently and is worried this may be contributing to her elevated blood pressure. She avoids adding salt to her food and is considering dietary changes.  Her echocardiogram on August 23, 2023, showed mild to moderate mitral regurgitation with otherwise grossly normal findings. A coronary CTA at the last visit showed no coronary artery disease, with aortic atherosclerosis and emphysema noted.  She takes atorvastatin  40 mg daily for LDL control as primary prevention of coronary artery disease. She is also on thyroid  medication.    ROS: negative except per HPI above.  Studies Reviewed: .        Results RADIOLOGY Cardiac MRI: Mild to  moderate mitral valve regurgitation, ejection fraction 55% Coronary CTA: No coronary artery disease, aortic atherosclerosis, emphysema  DIAGNOSTIC Echocardiogram: Otherwise grossly normal (08/23/2023) Risk Assessment/Calculations:       Physical Exam:   VS:  BP (!) 182/82 (BP Location: Left Arm, Patient Position: Sitting)   Pulse 62   Ht 5' 3 (1.6 m)   Wt 186 lb 12.8 oz (84.7 kg)   SpO2 93%   BMI 33.09 kg/m    Wt Readings from Last 3 Encounters:  08/13/24 186 lb 12.8 oz (84.7 kg)  05/14/24 181 lb 11.2 oz (82.4 kg)  07/01/23 191 lb 9.6 oz (86.9 kg)     Physical Exam GENERAL: Alert, cooperative, well developed, no acute distress. HEENT: Normocephalic, normal oropharynx, moist mucous membranes. CHEST: Clear to auscultation bilaterally, no wheezes, rhonchi, or crackles. CARDIOVASCULAR: Normal heart rate and rhythm, S1 and S2 normal without murmurs. ABDOMEN: Soft, non-tender, non-distended, without organomegaly, normal bowel sounds. EXTREMITIES: No cyanosis or edema. NEUROLOGICAL: Cranial nerves grossly intact, moves all extremities without gross motor or sensory deficit.   ASSESSMENT AND PLAN: .    Assessment and Plan Assessment & Plan Essential hypertension Hypertension with recent fluctuations, likely stress-related. Dizziness possibly linked to elevated blood pressure. Recent weight gain may contribute. - Continue valsartan  320 mg daily. - Use spironolactone  25 mg as needed for blood pressure control or dizziness. Take daily if BP remains elevated. - Monitor blood pressure at home, target 130/70 mmHg. - Follow-up in three months for blood pressure check.  Nonrheumatic mitral valve regurgitation (mild  to moderate, EF 55%) Mild to moderate mitral valve regurgitation with normal ejection fraction. No acute symptoms. - Continue current management and monitoring.  Hyperlipidemia, on statin therapy for primary prevention Hyperlipidemia well-controlled with atorvastatin . LDL  levels well controlled. - Continue atorvastatin  40 mg daily.  Recording duration: 20 minutes      Ailynn Gow, MD, FACC

## 2024-08-29 ENCOUNTER — Other Ambulatory Visit (HOSPITAL_COMMUNITY): Payer: Self-pay

## 2024-09-09 NOTE — Progress Notes (Signed)
 [name]                                          MRN: [id number]   09/09/2024   The [facility] Quality Team Specialist reviewed this patient medical record for the purposes of chart review for care gap closure. The following were reviewed: chart review for care gap closure-controlling blood pressure.    [facility] Quality Team

## 2024-09-22 ENCOUNTER — Other Ambulatory Visit: Payer: Self-pay | Admitting: Internal Medicine

## 2024-09-22 ENCOUNTER — Other Ambulatory Visit (HOSPITAL_COMMUNITY): Payer: Self-pay

## 2024-09-22 DIAGNOSIS — Z87891 Personal history of nicotine dependence: Secondary | ICD-10-CM

## 2024-09-22 LAB — LAB REPORT - SCANNED: EGFR: 81.8

## 2024-09-22 MED ORDER — LEVOTHYROXINE SODIUM 75 MCG PO TABS
ORAL_TABLET | ORAL | 0 refills | Status: AC
  Filled 2024-09-22: qty 90, 90d supply, fill #0

## 2024-09-23 ENCOUNTER — Other Ambulatory Visit: Payer: Self-pay

## 2024-09-29 ENCOUNTER — Inpatient Hospital Stay: Admission: RE | Admit: 2024-09-29 | Source: Ambulatory Visit

## 2024-10-01 ENCOUNTER — Ambulatory Visit: Payer: Self-pay | Admitting: Internal Medicine

## 2024-10-03 ENCOUNTER — Other Ambulatory Visit (HOSPITAL_COMMUNITY): Payer: Self-pay

## 2024-10-07 ENCOUNTER — Other Ambulatory Visit

## 2024-10-16 ENCOUNTER — Other Ambulatory Visit

## 2024-11-10 ENCOUNTER — Ambulatory Visit: Admitting: Internal Medicine
